# Patient Record
Sex: Female | Born: 1945 | Race: Black or African American | Hispanic: No | Marital: Single | Smoking: Former smoker
Health system: Southern US, Community
[De-identification: ages and names within clinical notes are randomized; demographics above are authoritative.]

## PROBLEM LIST (undated history)

## (undated) DIAGNOSIS — M797 Fibromyalgia: Secondary | ICD-10-CM

## (undated) DIAGNOSIS — G8929 Other chronic pain: Secondary | ICD-10-CM

## (undated) DIAGNOSIS — IMO0002 Reserved for concepts with insufficient information to code with codable children: Secondary | ICD-10-CM

## (undated) DIAGNOSIS — K219 Gastro-esophageal reflux disease without esophagitis: Secondary | ICD-10-CM

## (undated) DIAGNOSIS — M329 Systemic lupus erythematosus, unspecified: Secondary | ICD-10-CM

## (undated) DIAGNOSIS — I10 Essential (primary) hypertension: Secondary | ICD-10-CM

## (undated) DIAGNOSIS — E079 Disorder of thyroid, unspecified: Secondary | ICD-10-CM

## (undated) DIAGNOSIS — E785 Hyperlipidemia, unspecified: Secondary | ICD-10-CM

## (undated) HISTORY — PX: ABDOMINAL HYSTERECTOMY: SHX81

## (undated) HISTORY — DX: Other chronic pain: G89.29

## (undated) HISTORY — DX: Gastro-esophageal reflux disease without esophagitis: K21.9

## (undated) HISTORY — PX: SPINAL FIXATION SURGERY: SHX1055

## (undated) HISTORY — PX: CHOLECYSTECTOMY: SHX55

## (undated) HISTORY — DX: Essential (primary) hypertension: I10

## (undated) HISTORY — DX: Hyperlipidemia, unspecified: E78.5

## (undated) SURGERY — COLONOSCOPY
Anesthesia: Moderate Sedation

---

## 2000-12-12 ENCOUNTER — Ambulatory Visit (HOSPITAL_COMMUNITY): Admission: RE | Admit: 2000-12-12 | Discharge: 2000-12-12 | Payer: Self-pay | Admitting: Internal Medicine

## 2000-12-12 ENCOUNTER — Encounter: Payer: Self-pay | Admitting: Internal Medicine

## 2000-12-23 ENCOUNTER — Ambulatory Visit (HOSPITAL_COMMUNITY): Admission: RE | Admit: 2000-12-23 | Discharge: 2000-12-23 | Payer: Self-pay | Admitting: Internal Medicine

## 2001-04-06 ENCOUNTER — Encounter (HOSPITAL_COMMUNITY): Admission: RE | Admit: 2001-04-06 | Discharge: 2001-05-06 | Payer: Self-pay | Admitting: Rheumatology

## 2001-05-25 ENCOUNTER — Encounter (HOSPITAL_COMMUNITY): Admission: RE | Admit: 2001-05-25 | Discharge: 2001-06-24 | Payer: Self-pay | Admitting: Oncology

## 2001-07-04 ENCOUNTER — Ambulatory Visit (HOSPITAL_COMMUNITY): Admission: RE | Admit: 2001-07-04 | Discharge: 2001-07-04 | Payer: Self-pay | Admitting: Internal Medicine

## 2001-07-04 ENCOUNTER — Encounter: Payer: Self-pay | Admitting: Internal Medicine

## 2001-07-05 ENCOUNTER — Ambulatory Visit (HOSPITAL_COMMUNITY): Admission: RE | Admit: 2001-07-05 | Discharge: 2001-07-05 | Payer: Self-pay | Admitting: Internal Medicine

## 2001-07-05 ENCOUNTER — Encounter: Payer: Self-pay | Admitting: Internal Medicine

## 2001-07-19 ENCOUNTER — Ambulatory Visit (HOSPITAL_COMMUNITY): Admission: RE | Admit: 2001-07-19 | Discharge: 2001-07-19 | Payer: Self-pay | Admitting: Internal Medicine

## 2001-07-23 ENCOUNTER — Emergency Department (HOSPITAL_COMMUNITY): Admission: EM | Admit: 2001-07-23 | Discharge: 2001-07-23 | Payer: Self-pay | Admitting: Emergency Medicine

## 2001-07-27 ENCOUNTER — Encounter (HOSPITAL_COMMUNITY): Admission: RE | Admit: 2001-07-27 | Discharge: 2001-08-26 | Payer: Self-pay | Admitting: Rheumatology

## 2001-11-17 ENCOUNTER — Encounter: Payer: Self-pay | Admitting: Internal Medicine

## 2001-11-17 ENCOUNTER — Ambulatory Visit (HOSPITAL_COMMUNITY): Admission: RE | Admit: 2001-11-17 | Discharge: 2001-11-17 | Payer: Self-pay | Admitting: Internal Medicine

## 2001-12-05 ENCOUNTER — Ambulatory Visit (HOSPITAL_COMMUNITY): Admission: RE | Admit: 2001-12-05 | Discharge: 2001-12-05 | Payer: Self-pay | Admitting: General Surgery

## 2002-04-24 ENCOUNTER — Ambulatory Visit (HOSPITAL_COMMUNITY): Admission: RE | Admit: 2002-04-24 | Discharge: 2002-04-24 | Payer: Self-pay | Admitting: Family Medicine

## 2002-04-24 ENCOUNTER — Encounter: Payer: Self-pay | Admitting: Family Medicine

## 2002-06-07 ENCOUNTER — Ambulatory Visit (HOSPITAL_COMMUNITY): Admission: RE | Admit: 2002-06-07 | Discharge: 2002-06-07 | Payer: Self-pay | Admitting: Internal Medicine

## 2002-06-07 ENCOUNTER — Encounter (INDEPENDENT_AMBULATORY_CARE_PROVIDER_SITE_OTHER): Payer: Self-pay | Admitting: Internal Medicine

## 2002-09-18 ENCOUNTER — Encounter (HOSPITAL_COMMUNITY): Admission: RE | Admit: 2002-09-18 | Discharge: 2002-10-18 | Payer: Self-pay | Admitting: Internal Medicine

## 2002-12-03 ENCOUNTER — Emergency Department (HOSPITAL_COMMUNITY): Admission: EM | Admit: 2002-12-03 | Discharge: 2002-12-03 | Payer: Self-pay | Admitting: *Deleted

## 2002-12-03 ENCOUNTER — Encounter: Payer: Self-pay | Admitting: *Deleted

## 2002-12-05 ENCOUNTER — Emergency Department (HOSPITAL_COMMUNITY): Admission: EM | Admit: 2002-12-05 | Discharge: 2002-12-05 | Payer: Self-pay | Admitting: Emergency Medicine

## 2002-12-05 ENCOUNTER — Encounter: Payer: Self-pay | Admitting: Emergency Medicine

## 2002-12-20 ENCOUNTER — Encounter: Payer: Self-pay | Admitting: Family Medicine

## 2002-12-20 ENCOUNTER — Inpatient Hospital Stay (HOSPITAL_COMMUNITY): Admission: AD | Admit: 2002-12-20 | Discharge: 2002-12-27 | Payer: Self-pay | Admitting: Family Medicine

## 2002-12-20 ENCOUNTER — Ambulatory Visit (HOSPITAL_COMMUNITY): Admission: RE | Admit: 2002-12-20 | Discharge: 2002-12-20 | Payer: Self-pay | Admitting: Family Medicine

## 2002-12-21 ENCOUNTER — Encounter (INDEPENDENT_AMBULATORY_CARE_PROVIDER_SITE_OTHER): Payer: Self-pay | Admitting: Internal Medicine

## 2002-12-25 ENCOUNTER — Encounter: Payer: Self-pay | Admitting: Internal Medicine

## 2003-02-03 ENCOUNTER — Inpatient Hospital Stay (HOSPITAL_COMMUNITY): Admission: EM | Admit: 2003-02-03 | Discharge: 2003-02-09 | Payer: Self-pay | Admitting: Emergency Medicine

## 2003-02-04 ENCOUNTER — Encounter: Payer: Self-pay | Admitting: Family Medicine

## 2003-02-17 ENCOUNTER — Emergency Department (HOSPITAL_COMMUNITY): Admission: EM | Admit: 2003-02-17 | Discharge: 2003-02-17 | Payer: Self-pay | Admitting: Internal Medicine

## 2003-03-06 ENCOUNTER — Encounter: Payer: Self-pay | Admitting: Internal Medicine

## 2003-03-06 ENCOUNTER — Ambulatory Visit (HOSPITAL_COMMUNITY): Admission: RE | Admit: 2003-03-06 | Discharge: 2003-03-06 | Payer: Self-pay | Admitting: Internal Medicine

## 2003-03-28 ENCOUNTER — Encounter (HOSPITAL_COMMUNITY): Admission: RE | Admit: 2003-03-28 | Discharge: 2003-04-27 | Payer: Self-pay | Admitting: Rheumatology

## 2003-06-20 ENCOUNTER — Encounter (HOSPITAL_COMMUNITY): Admission: RE | Admit: 2003-06-20 | Discharge: 2003-07-20 | Payer: Self-pay | Admitting: Rheumatology

## 2003-07-04 ENCOUNTER — Ambulatory Visit (HOSPITAL_COMMUNITY): Admission: RE | Admit: 2003-07-04 | Discharge: 2003-07-04 | Payer: Self-pay | Admitting: Internal Medicine

## 2003-07-23 ENCOUNTER — Emergency Department (HOSPITAL_COMMUNITY): Admission: EM | Admit: 2003-07-23 | Discharge: 2003-07-23 | Payer: Self-pay | Admitting: Emergency Medicine

## 2003-08-29 ENCOUNTER — Ambulatory Visit (HOSPITAL_COMMUNITY): Admission: RE | Admit: 2003-08-29 | Discharge: 2003-08-29 | Payer: Self-pay | Admitting: Internal Medicine

## 2003-11-12 ENCOUNTER — Inpatient Hospital Stay (HOSPITAL_COMMUNITY): Admission: EM | Admit: 2003-11-12 | Discharge: 2003-11-16 | Payer: Self-pay | Admitting: Emergency Medicine

## 2003-12-04 ENCOUNTER — Inpatient Hospital Stay (HOSPITAL_COMMUNITY): Admission: EM | Admit: 2003-12-04 | Discharge: 2003-12-18 | Payer: Self-pay | Admitting: Emergency Medicine

## 2004-01-08 ENCOUNTER — Inpatient Hospital Stay (HOSPITAL_COMMUNITY): Admission: EM | Admit: 2004-01-08 | Discharge: 2004-01-15 | Payer: Self-pay | Admitting: Emergency Medicine

## 2004-02-04 ENCOUNTER — Ambulatory Visit (HOSPITAL_COMMUNITY): Admission: RE | Admit: 2004-02-04 | Discharge: 2004-02-04 | Payer: Self-pay | Admitting: Internal Medicine

## 2004-02-27 ENCOUNTER — Inpatient Hospital Stay (HOSPITAL_COMMUNITY): Admission: EM | Admit: 2004-02-27 | Discharge: 2004-03-14 | Payer: Self-pay | Admitting: Emergency Medicine

## 2004-03-17 ENCOUNTER — Inpatient Hospital Stay (HOSPITAL_COMMUNITY): Admission: EM | Admit: 2004-03-17 | Discharge: 2004-03-24 | Payer: Self-pay | Admitting: Emergency Medicine

## 2004-06-01 ENCOUNTER — Emergency Department (HOSPITAL_COMMUNITY): Admission: EM | Admit: 2004-06-01 | Discharge: 2004-06-01 | Payer: Self-pay | Admitting: Emergency Medicine

## 2004-06-10 ENCOUNTER — Inpatient Hospital Stay (HOSPITAL_COMMUNITY): Admission: AD | Admit: 2004-06-10 | Discharge: 2004-06-15 | Payer: Self-pay | Admitting: Internal Medicine

## 2004-08-02 ENCOUNTER — Inpatient Hospital Stay (HOSPITAL_COMMUNITY): Admission: EM | Admit: 2004-08-02 | Discharge: 2004-08-11 | Payer: Self-pay | Admitting: Emergency Medicine

## 2004-09-04 ENCOUNTER — Ambulatory Visit: Payer: Self-pay | Admitting: Psychiatry

## 2004-09-08 ENCOUNTER — Emergency Department (HOSPITAL_COMMUNITY): Admission: EM | Admit: 2004-09-08 | Discharge: 2004-09-08 | Payer: Self-pay | Admitting: Emergency Medicine

## 2004-10-16 ENCOUNTER — Inpatient Hospital Stay (HOSPITAL_COMMUNITY): Admission: AD | Admit: 2004-10-16 | Discharge: 2004-10-26 | Payer: Self-pay | Admitting: Internal Medicine

## 2004-10-16 ENCOUNTER — Ambulatory Visit: Payer: Self-pay | Admitting: Internal Medicine

## 2004-11-22 ENCOUNTER — Emergency Department (HOSPITAL_COMMUNITY): Admission: EM | Admit: 2004-11-22 | Discharge: 2004-11-22 | Payer: Self-pay | Admitting: Emergency Medicine

## 2004-12-16 ENCOUNTER — Inpatient Hospital Stay (HOSPITAL_COMMUNITY): Admission: EM | Admit: 2004-12-16 | Discharge: 2004-12-25 | Payer: Self-pay | Admitting: Emergency Medicine

## 2004-12-16 ENCOUNTER — Ambulatory Visit: Payer: Self-pay | Admitting: Gastroenterology

## 2005-03-02 ENCOUNTER — Inpatient Hospital Stay (HOSPITAL_COMMUNITY): Admission: EM | Admit: 2005-03-02 | Discharge: 2005-03-11 | Payer: Self-pay | Admitting: Emergency Medicine

## 2005-04-05 ENCOUNTER — Emergency Department (HOSPITAL_COMMUNITY): Admission: EM | Admit: 2005-04-05 | Discharge: 2005-04-05 | Payer: Self-pay | Admitting: Emergency Medicine

## 2005-05-24 ENCOUNTER — Encounter: Payer: Self-pay | Admitting: Obstetrics and Gynecology

## 2005-05-24 ENCOUNTER — Inpatient Hospital Stay (HOSPITAL_COMMUNITY): Admission: RE | Admit: 2005-05-24 | Discharge: 2005-05-26 | Payer: Self-pay | Admitting: Obstetrics and Gynecology

## 2005-07-04 ENCOUNTER — Emergency Department (HOSPITAL_COMMUNITY): Admission: EM | Admit: 2005-07-04 | Discharge: 2005-07-04 | Payer: Self-pay | Admitting: Emergency Medicine

## 2005-07-09 ENCOUNTER — Ambulatory Visit: Payer: Self-pay | Admitting: Psychiatry

## 2005-08-06 ENCOUNTER — Ambulatory Visit: Payer: Self-pay | Admitting: Psychiatry

## 2005-08-09 ENCOUNTER — Ambulatory Visit (HOSPITAL_COMMUNITY): Admission: RE | Admit: 2005-08-09 | Discharge: 2005-08-09 | Payer: Self-pay | Admitting: Family Medicine

## 2005-08-20 ENCOUNTER — Ambulatory Visit: Payer: Self-pay | Admitting: Psychiatry

## 2005-09-01 ENCOUNTER — Inpatient Hospital Stay (HOSPITAL_COMMUNITY): Admission: EM | Admit: 2005-09-01 | Discharge: 2005-09-23 | Payer: Self-pay | Admitting: Emergency Medicine

## 2005-09-07 ENCOUNTER — Ambulatory Visit: Payer: Self-pay | Admitting: Internal Medicine

## 2005-11-18 ENCOUNTER — Inpatient Hospital Stay (HOSPITAL_COMMUNITY): Admission: AD | Admit: 2005-11-18 | Discharge: 2005-12-02 | Payer: Self-pay | Admitting: Internal Medicine

## 2005-12-23 ENCOUNTER — Emergency Department (HOSPITAL_COMMUNITY): Admission: EM | Admit: 2005-12-23 | Discharge: 2005-12-23 | Payer: Self-pay | Admitting: Emergency Medicine

## 2005-12-27 ENCOUNTER — Emergency Department (HOSPITAL_COMMUNITY): Admission: EM | Admit: 2005-12-27 | Discharge: 2005-12-28 | Payer: Self-pay | Admitting: Emergency Medicine

## 2005-12-28 ENCOUNTER — Inpatient Hospital Stay (HOSPITAL_COMMUNITY): Admission: AD | Admit: 2005-12-28 | Discharge: 2006-01-10 | Payer: Self-pay | Admitting: Family Medicine

## 2005-12-29 ENCOUNTER — Ambulatory Visit: Payer: Self-pay | Admitting: Internal Medicine

## 2006-01-24 ENCOUNTER — Inpatient Hospital Stay (HOSPITAL_COMMUNITY): Admission: AD | Admit: 2006-01-24 | Discharge: 2006-02-08 | Payer: Self-pay | Admitting: Family Medicine

## 2006-01-31 ENCOUNTER — Ambulatory Visit: Payer: Self-pay | Admitting: Internal Medicine

## 2006-05-19 ENCOUNTER — Emergency Department (HOSPITAL_COMMUNITY): Admission: EM | Admit: 2006-05-19 | Discharge: 2006-05-19 | Payer: Self-pay | Admitting: Emergency Medicine

## 2006-05-20 ENCOUNTER — Inpatient Hospital Stay (HOSPITAL_COMMUNITY): Admission: EM | Admit: 2006-05-20 | Discharge: 2006-05-27 | Payer: Self-pay | Admitting: Emergency Medicine

## 2006-07-04 ENCOUNTER — Inpatient Hospital Stay (HOSPITAL_COMMUNITY): Admission: AD | Admit: 2006-07-04 | Discharge: 2006-07-08 | Payer: Self-pay | Admitting: Internal Medicine

## 2006-08-26 ENCOUNTER — Inpatient Hospital Stay (HOSPITAL_COMMUNITY): Admission: AD | Admit: 2006-08-26 | Discharge: 2006-08-29 | Payer: Self-pay | Admitting: Internal Medicine

## 2006-12-21 ENCOUNTER — Inpatient Hospital Stay (HOSPITAL_COMMUNITY): Admission: AD | Admit: 2006-12-21 | Discharge: 2006-12-27 | Payer: Self-pay | Admitting: Internal Medicine

## 2007-04-22 ENCOUNTER — Emergency Department (HOSPITAL_COMMUNITY): Admission: EM | Admit: 2007-04-22 | Discharge: 2007-04-22 | Payer: Self-pay | Admitting: Emergency Medicine

## 2008-02-15 ENCOUNTER — Ambulatory Visit (HOSPITAL_COMMUNITY): Admission: RE | Admit: 2008-02-15 | Discharge: 2008-02-15 | Payer: Self-pay | Admitting: Internal Medicine

## 2008-07-04 ENCOUNTER — Emergency Department (HOSPITAL_COMMUNITY): Admission: EM | Admit: 2008-07-04 | Discharge: 2008-07-04 | Payer: Self-pay | Admitting: Emergency Medicine

## 2008-11-15 ENCOUNTER — Ambulatory Visit (HOSPITAL_COMMUNITY): Admission: RE | Admit: 2008-11-15 | Discharge: 2008-11-15 | Payer: Self-pay | Admitting: Internal Medicine

## 2008-11-28 ENCOUNTER — Emergency Department (HOSPITAL_COMMUNITY): Admission: EM | Admit: 2008-11-28 | Discharge: 2008-11-28 | Payer: Self-pay | Admitting: Emergency Medicine

## 2009-03-07 ENCOUNTER — Emergency Department (HOSPITAL_COMMUNITY): Admission: EM | Admit: 2009-03-07 | Discharge: 2009-03-07 | Payer: Self-pay | Admitting: Emergency Medicine

## 2009-05-18 ENCOUNTER — Emergency Department (HOSPITAL_COMMUNITY): Admission: EM | Admit: 2009-05-18 | Discharge: 2009-05-18 | Payer: Self-pay | Admitting: Emergency Medicine

## 2010-09-26 ENCOUNTER — Encounter: Payer: Self-pay | Admitting: Family Medicine

## 2010-09-27 ENCOUNTER — Encounter: Payer: Self-pay | Admitting: Internal Medicine

## 2010-09-27 ENCOUNTER — Encounter (INDEPENDENT_AMBULATORY_CARE_PROVIDER_SITE_OTHER): Payer: Self-pay | Admitting: Internal Medicine

## 2010-09-30 ENCOUNTER — Encounter: Payer: Self-pay | Admitting: Orthopedic Surgery

## 2010-09-30 ENCOUNTER — Ambulatory Visit
Admission: RE | Admit: 2010-09-30 | Discharge: 2010-09-30 | Payer: Self-pay | Source: Home / Self Care | Attending: Orthopedic Surgery | Admitting: Orthopedic Surgery

## 2010-09-30 DIAGNOSIS — M179 Osteoarthritis of knee, unspecified: Secondary | ICD-10-CM | POA: Insufficient documentation

## 2010-09-30 DIAGNOSIS — M1710 Unilateral primary osteoarthritis, unspecified knee: Secondary | ICD-10-CM | POA: Insufficient documentation

## 2010-10-08 NOTE — Medication Information (Signed)
Summary: Tax adviser   Imported By: Cammie Sickle 09/30/2010 20:56:26  _____________________________________________________________________  External Attachment:    Type:   Image     Comment:   External Document

## 2010-10-08 NOTE — Letter (Signed)
Summary: *Orthopedic Consult Note  [facility]  [address]  [place], [place] [address]   Phone: [phone]  Fax: [phone]    Re:    [name] DOB:    09-02-1946   Dear: Dr. Regino Schultze    Thank you for requesting that we see the above patient for consultation.  A copy of the detailed office note will be sent under separate cover, for your review.  Evaluation today is consistent with:  1)  KNEE, ARTHRITIS, DEGEN./OSTEO (ICD-715.96)   Our recommendation is for: Injection as a short term measure. however, if she will need knee replacement. She will need tertiary care for the following reasons. #1 she has chronic pain in his only chronic pain related or not with. She also has lupus. She will need in house close medical management. She has severe osteoarthritis of that knee.       Thank you for this opportunity to look after your patient.  Sincerely,   Terrance Mass. MD.

## 2010-10-08 NOTE — Letter (Signed)
Summary: History form  History form   Imported By: Jacklynn Ganong 10/02/2010 13:15:06  _____________________________________________________________________  External Attachment:    Type:   Image     Comment:   External Document

## 2010-10-08 NOTE — Assessment & Plan Note (Signed)
Summary: RT KNEE PAIN/NEEDS XRAYS/REF W.[facility]GOUGH/MEDICARE/CA MCD/CAF   Visit Type:  Initial Consult Referring Provider:  same Primary Provider:  Dr Regino Schultze  CC:  bilateral knee pain.  History of Present Illness:   This 65 year old female with history of lupus, status post hysterectomy, and cholecystectomy, history of chronic pain, treated with hydromorphone, presents with bilateral knee pain, RIGHT greater than LEFT. Patient denies any injury. Complains of throbbing 8/10 knee pain, which has become constant. It is worse in the morning and worse at night and worse with exercise. She gets relief from applying heat to her knees, but is worse with walking. In her knees and very swollen. Her knee pain is diffuse.  Family history of heart disease, arthritis, cancer, and diabetes.  She is single unemployed does not smoke or tracking. Educational level is 312  Review of systems weight gain and fatigue blurred vision chest pain, shortness of breath, nausea, constipation, stiffness, muscle pain, dizziness, anxiety, depression, heating, cold intolerance.  Denies frequency, urgency, changes in skin, easy bleeding or bruising or adverse reaction to food.  She listed allergies to sulfa, and Cymbalta  Past History:  Past Medical History: LUPUS   Past Surgical History: Cholecystectomy Total Abdominal Hysterectomy  Family History: see hpi   Social History: see hpi   Review of Systems      See HPI  Physical Exam  Additional Exam:   Gen. appearance is medium claim.  She is oriented x3. Mood and affect are normal.  She seems to ambulate, favoring her RIGHT leg, but she has bilateral pain in her knees.  Upper extremities show no gross malalignment changes. No joint contracture, subluxation, atrophy, or tremor. Skin is normal.  Pulses and temperature are normal in her upper extremities and there is no lymphadenopathy. Sensation is normal.alignment seems normal. Range of motion limited  to 95 of flexion with a painful flexion-extension arc. Throughout. There is no heat or warmth in the knee. The joint is stable. The strength is normal in quads. Skin is intact pulse and temperature are normal. There is no lymphadenopathy. Sensation remains normal as well.  The LEFT knee is not swollen, but has crepitance on range of motion flexion is 110 extension is -5, stability and strength remained normal. Skin and pulse are good. Lymph nodes are normal. Sensation is intact.  Overall, coordination and balance are normal.   Impression & Recommendations:  Problem # 1:  KNEE, ARTHRITIS, DEGEN./OSTEO (ICD-715.96) Assessment New  Separate and Identifiable X-Ray report   radiographs 3 views of the RIGHT knee.  She has severe valgus arthritis Tritus of the RIGHT knee with joint space narrowing medially and laterally. Multiple osteophytes and what appeared to be multiple loose bodies perhaps.  The joint appears to be destroyed.  Impression severe osteoarthritis with valgus deformity of the RIGHT knee.  Verbal consent was obtained. The RIGHT knee was prepped with alcohol and ethyl chloride. 1 cc of depomedrol 40mg /cc and 4 cc of lidocaine 1% was injected. there were no complications.  Her updated medication list for this problem includes:    Nabumetone 500 Mg Tabs (Nabumetone) .Marland Kitchen... 1 by mouth two times a day  Orders: New Patient Level III (75643) Knee x-ray,  3 views (32951) Joint Aspirate / Injection, Large (20610) Depo- Medrol 40mg  (J1030)  And injected her RIGHT knee. She has chronic pain and is on a strong pain reliever, which doesn't appear to help her knee pain. She will need a knee replacement and with her history of lupus. She  will need tertiary care when that time comes.  Medications Added to Medication List This Visit: 1)  Nabumetone 500 Mg Tabs (Nabumetone) .Marland Kitchen.. 1 by mouth two times a day  Patient Instructions: 1)  Please schedule a follow-up appointment as  needed. 2)  You have received an injection of cortisone today. You may experience increased pain at the injection site. Apply ice pack to the area for 20 minutes every 2 hours and take 2 xtra strength tylenol every 8 hours. This increased pain will usually resolve in 24 hours. The injection will take effect in 3-10 days.  Prescriptions: NABUMETONE 500 MG TABS (NABUMETONE) 1 by mouth two times a day  #90 x 5   Entered and Authorized by:   Fuller Canada MD   Signed by:   Fuller Canada MD on 09/30/2010   Method used:   Handwritten   RxID:   [id number]    Orders Added: 1)  New Patient Level III [56213] 2)  Knee x-ray,  3 views [73562] 3)  Joint Aspirate / Injection, Large [20610] 4)  Depo- Medrol 40mg  [J1030]

## 2010-11-14 ENCOUNTER — Emergency Department (HOSPITAL_COMMUNITY)
Admission: EM | Admit: 2010-11-14 | Discharge: 2010-11-14 | Disposition: A | Discharge status: Home / Self Care | Payer: Medicare Other | Attending: Emergency Medicine | Admitting: Emergency Medicine

## 2010-11-14 DIAGNOSIS — E789 Disorder of lipoprotein metabolism, unspecified: Secondary | ICD-10-CM | POA: Insufficient documentation

## 2010-11-14 DIAGNOSIS — E119 Type 2 diabetes mellitus without complications: Secondary | ICD-10-CM | POA: Insufficient documentation

## 2010-11-14 DIAGNOSIS — I10 Essential (primary) hypertension: Secondary | ICD-10-CM | POA: Insufficient documentation

## 2010-11-14 DIAGNOSIS — R0609 Other forms of dyspnea: Secondary | ICD-10-CM | POA: Insufficient documentation

## 2010-11-14 DIAGNOSIS — IMO0001 Reserved for inherently not codable concepts without codable children: Secondary | ICD-10-CM | POA: Insufficient documentation

## 2010-11-14 DIAGNOSIS — F3289 Other specified depressive episodes: Secondary | ICD-10-CM | POA: Insufficient documentation

## 2010-11-14 DIAGNOSIS — Z9889 Other specified postprocedural states: Secondary | ICD-10-CM | POA: Insufficient documentation

## 2010-11-14 DIAGNOSIS — Z79899 Other long term (current) drug therapy: Secondary | ICD-10-CM | POA: Insufficient documentation

## 2010-11-14 DIAGNOSIS — M329 Systemic lupus erythematosus, unspecified: Secondary | ICD-10-CM | POA: Insufficient documentation

## 2010-11-14 DIAGNOSIS — R0989 Other specified symptoms and signs involving the circulatory and respiratory systems: Secondary | ICD-10-CM | POA: Insufficient documentation

## 2010-11-14 DIAGNOSIS — E039 Hypothyroidism, unspecified: Secondary | ICD-10-CM | POA: Insufficient documentation

## 2010-11-14 DIAGNOSIS — F329 Major depressive disorder, single episode, unspecified: Secondary | ICD-10-CM | POA: Insufficient documentation

## 2010-11-14 LAB — DIFFERENTIAL
Basophils Absolute: 0 10*3/uL (ref 0.0–0.1)
Eosinophils Absolute: 0.2 10*3/uL (ref 0.0–0.7)
Eosinophils Relative: 6 % — ABNORMAL HIGH (ref 0–5)
Lymphocytes Relative: 34 % (ref 12–46)
Monocytes Absolute: 0.3 10*3/uL (ref 0.1–1.0)

## 2010-11-14 LAB — CBC
HCT: 33.5 % — ABNORMAL LOW (ref 36.0–46.0)
MCHC: 32.2 g/dL (ref 30.0–36.0)
RDW: 12.5 % (ref 11.5–15.5)

## 2010-11-14 LAB — BASIC METABOLIC PANEL
CO2: 28 mEq/L (ref 19–32)
Calcium: 9.5 mg/dL (ref 8.4–10.5)
Creatinine, Ser: 1.08 mg/dL (ref 0.4–1.2)
GFR calc Af Amer: >60 mL/min (ref >60)
Glucose, Bld: 99 mg/dL (ref 70–99)

## 2010-11-14 LAB — URINALYSIS, ROUTINE W REFLEX MICROSCOPIC
Bilirubin Urine: NEGATIVE
Hgb urine dipstick: NEGATIVE
Protein, ur: NEGATIVE mg/dL
Urobilinogen, UA: 0.2 mg/dL (ref 0.0–1.0)

## 2010-12-11 LAB — DIFFERENTIAL
Basophils Absolute: 0 10*3/uL (ref 0.0–0.1)
Basophils Relative: 1 % (ref 0–1)
Neutro Abs: 1.3 10*3/uL — ABNORMAL LOW (ref 1.7–7.7)
Neutrophils Relative %: 50 % (ref 43–77)

## 2010-12-11 LAB — COMPREHENSIVE METABOLIC PANEL
ALT: 24 U/L (ref 0–35)
AST: 36 U/L (ref 0–37)
Calcium: 9.5 mg/dL (ref 8.4–10.5)
GFR calc Af Amer: >60 mL/min (ref >60)
Sodium: 139 mEq/L (ref 135–145)
Total Protein: 7.3 g/dL (ref 6.0–8.3)

## 2010-12-11 LAB — URINALYSIS, ROUTINE W REFLEX MICROSCOPIC
Glucose, UA: NEGATIVE mg/dL
Ketones, ur: NEGATIVE mg/dL
Specific Gravity, Urine: 1.01 (ref 1.005–1.030)
pH: 7.5 (ref 5.0–8.0)

## 2010-12-11 LAB — CBC
MCHC: 35.2 g/dL (ref 30.0–36.0)
Platelets: 157 10*3/uL (ref 150–400)
RDW: 12.4 % (ref 11.5–15.5)

## 2010-12-13 LAB — COMPREHENSIVE METABOLIC PANEL
Alkaline Phosphatase: 62 U/L (ref 39–117)
BUN: 14 mg/dL (ref 6–23)
CO2: 32 mEq/L (ref 19–32)
Chloride: 103 mEq/L (ref 96–112)
GFR calc non Af Amer: 49 mL/min — ABNORMAL LOW (ref >60)
Glucose, Bld: 83 mg/dL (ref 70–99)
Potassium: 4 mEq/L (ref 3.5–5.1)
Total Bilirubin: 0.4 mg/dL (ref 0.3–1.2)
Total Protein: 6.8 g/dL (ref 6.0–8.3)

## 2010-12-13 LAB — CBC
HCT: 30.4 % — ABNORMAL LOW (ref 36.0–46.0)
Hemoglobin: 10.8 g/dL — ABNORMAL LOW (ref 12.0–15.0)
RBC: 3.26 MIL/uL — ABNORMAL LOW (ref 3.87–5.11)
RDW: 13 % (ref 11.5–15.5)

## 2010-12-13 LAB — DIFFERENTIAL
Basophils Absolute: 0 10*3/uL (ref 0.0–0.1)
Basophils Relative: 0 % (ref 0–1)
Eosinophils Absolute: 0.1 10*3/uL (ref 0.0–0.7)
Neutro Abs: 2.3 10*3/uL (ref 1.7–7.7)
Neutrophils Relative %: 58 % (ref 43–77)

## 2010-12-13 LAB — POCT CARDIAC MARKERS: Myoglobin, poc: 67.5 ng/mL (ref 12–200)

## 2010-12-13 LAB — D-DIMER, QUANTITATIVE: D-Dimer, Quant: 0.54 ug/mL-FEU — ABNORMAL HIGH (ref 0.00–0.48)

## 2010-12-16 DIAGNOSIS — I10 Essential (primary) hypertension: Secondary | ICD-10-CM

## 2010-12-16 DIAGNOSIS — E785 Hyperlipidemia, unspecified: Secondary | ICD-10-CM

## 2010-12-16 DIAGNOSIS — G8929 Other chronic pain: Secondary | ICD-10-CM

## 2010-12-16 HISTORY — DX: Hyperlipidemia, unspecified: E78.5

## 2010-12-16 HISTORY — DX: Essential (primary) hypertension: I10

## 2010-12-16 HISTORY — DX: Other chronic pain: G89.29

## 2010-12-17 LAB — COMPREHENSIVE METABOLIC PANEL
Albumin: 3.9 g/dL (ref 3.5–5.2)
Alkaline Phosphatase: 69 U/L (ref 39–117)
BUN: 14 mg/dL (ref 6–23)
Chloride: 103 mEq/L (ref 96–112)
Creatinine, Ser: 1.02 mg/dL (ref 0.4–1.2)
Glucose, Bld: 93 mg/dL (ref 70–99)
Potassium: 4.2 mEq/L (ref 3.5–5.1)
Total Bilirubin: 0.4 mg/dL (ref 0.3–1.2)

## 2010-12-17 LAB — URINALYSIS, ROUTINE W REFLEX MICROSCOPIC
Glucose, UA: NEGATIVE mg/dL
Hgb urine dipstick: NEGATIVE
Ketones, ur: NEGATIVE mg/dL
Protein, ur: NEGATIVE mg/dL
pH: 6 (ref 5.0–8.0)

## 2010-12-17 LAB — DIFFERENTIAL
Basophils Absolute: 0 10*3/uL (ref 0.0–0.1)
Basophils Relative: 0 % (ref 0–1)
Lymphocytes Relative: 36 % (ref 12–46)
Monocytes Absolute: 0.3 10*3/uL (ref 0.1–1.0)
Neutro Abs: 1.5 10*3/uL — ABNORMAL LOW (ref 1.7–7.7)
Neutrophils Relative %: 50 % (ref 43–77)

## 2010-12-17 LAB — CBC
HCT: 31.4 % — ABNORMAL LOW (ref 36.0–46.0)
Hemoglobin: 10.7 g/dL — ABNORMAL LOW (ref 12.0–15.0)
MCV: 92.8 fL (ref 78.0–100.0)
Platelets: 167 10*3/uL (ref 150–400)
RDW: 13 % (ref 11.5–15.5)

## 2010-12-17 LAB — LIPASE, BLOOD: Lipase: 30 U/L (ref 11–59)

## 2010-12-17 LAB — D-DIMER, QUANTITATIVE: D-Dimer, Quant: 0.63 ug/mL-FEU — ABNORMAL HIGH (ref 0.00–0.48)

## 2011-01-21 ENCOUNTER — Ambulatory Visit (INDEPENDENT_AMBULATORY_CARE_PROVIDER_SITE_OTHER): Payer: Medicare Other | Admitting: Internal Medicine

## 2011-01-22 NOTE — Discharge Summary (Signed)
[name], [name]                  ACCOUNT NO.:  [id number]   MEDICAL RECORD NO.:  [id number]          PATIENT TYPE:  INP   LOCATION:  A303                          FACILITY:  [facility]   PHYSICIAN:  Corrie Mckusick, M.D.  DATE OF BIRTH:  07-04-1946   DATE OF ADMISSION:  01/24/2006  DATE OF DISCHARGE:  06/05/2007LH                                 DISCHARGE SUMMARY   DISCHARGE DIAGNOSES:  1.  Recurrent abdominal pain and fatigue.  2.  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   The patient was admitted on the 21st with recurrent abdominal pain for again  close monitoring and pain control.  GI was once again consulted and their  input was greatly appreciated.  Please see their notes for details.  She had  been recently admitted to the hospital.  Please see those notes for further  details.   Due to the weakness and an episode of chest pain, we elected to get  cardiology involved and Dr. Domingo Sep felt this was atypical chest pain.  She  had seen cardiology in the past.  She felt like this was more GI in origin.  She was transfused during the stay due to anemia of chronic disease.  Dr.  Lovell Sheehan was also consulted for Port-A-Cath placement as she has got  difficult IV access.  It was elected to hold off on this as well.   She had a normal V-Q scan during the stay as well.  She remained relatively  stable during the hospital stay after that point.  It was strongly discussed  with the patient that she would benefit from nursing home placement as she  seems to return quite frequently to the hospital.  It was felt like this  would help benefit her greatly.  It was decided for placement and she was  ready for placement on June 4.  We did not obtain a bed until the 5th and  was set up for transfer to skilled nursing facility on June 5.   DIET:  Regular diet.   DISCHARGE PHYSICAL:  Temperature 98.7, blood pressure 130/60, heart rate in  the  60s, respiratory rate 16-18.  No acute distress.  CHEST:  Clear to auscultation bilaterally.  CARDIOVASCULAR:  Regular rate and rhythm.  No murmurs.  ABDOMEN:  Soft, nontender.  EXTREMITIES:  No edema.   LABS AT TIME OF DISCHARGE:  White count 5.7, hemoglobin 9.7, hematocrit  29.8, platelets 240.   DISCHARGE MEDICATIONS:  1.  Atenolol 25 mg tabs half p.o. b.i.d.  2.  Lyrica 75 mg p.o. t.i.d.  3.  Prednisone 10 mg p.o. daily.  4.  Zocor 20 mg p.o. at bedtime.  5.  Pancrease three caps p.o. t.i.d.  6.  Lasix 20 mg p.o. q.a.m.  7.  Carafate 1 gram p.o. q.i.d.  8.  Isordil 5 mg p.o. t.i.d.  9.  Synthroid 100 mcg p.o. daily.  10. Zofran 4 mg p.o. t.i.d. p.r.n.  11. Zoloft 50 mg p.o. daily.  12. MS Contin  60 mg p.o. b.i.d.  13. Protonix 40 mg p.o. daily.  14. Bentyl 10 mg p.o. t.i.d. prior to meals.   DISCHARGE CONDITION:  Improved and stable.   DISCHARGE FOLLOWUP:  At the nursing care facility with the nursing home  physician.      Corrie Mckusick, M.D.  Electronically Signed     JCG/MEDQ  D:  02/08/2006  T:  02/08/2006  Job:  [id number]

## 2011-01-22 NOTE — Discharge Summary (Signed)
NAME:  [name], Sandra                  ACCOUNT NO.:  [id number]   MEDICAL RECORD NO.:  [id number]          PATIENT TYPE:  INP   LOCATION:  A201                          FACILITY:  [facility]   PHYSICIAN:  Madelin Rear. Sherwood Gambler, MD  DATE OF BIRTH:  03/23/1946   DATE OF ADMISSION:  12/21/2006  DATE OF DISCHARGE:  04/22/2008[facility]                               DISCHARGE SUMMARY   DISCHARGE DIAGNOSES:  1. Pneumonia.  2. Lupus.  3. Pemphigus vulgaris.  4. Hypothyroidism.  5. Hypertension.   HISTORY OF PRESENT ILLNESS:  Patient was admitted for cough, fever and  sputum production.  She had classic physical findings of pneumonia.  She  also had a chest x-ray that confirmed this diagnosis.  She had a  relatively uneventful hospital course except requiring transfusion for  hemoglobin drop to less than 9 g percent.  Analgesia was adequately  obtained in house.  Antibiotics were given with resolution in her  symptoms and defervescence.   DISCHARGE MEDICATIONS:  Continue her usual home regimen consisting of  1. Lyrica 75 mg p.o. t.i.d.  2. Synthroid 125 mcg per day.  3. Atenolol 12.5 daily.  4. Reglan 5 mg p.o. daily.  5. MS Contin 60 mg p.o. b.i.d.      Madelin Rear. Sherwood Gambler, MD  Electronically Signed     LJF/MEDQ  D:  01/06/2007  T:  01/06/2007  Job:  [id number]

## 2011-01-22 NOTE — Op Note (Signed)
NAME:  [name], [name]                  ACCOUNT NO.:  [id number]   MEDICAL RECORD NO.:  [id number]          PATIENT TYPE:  INP   LOCATION:  A219                          FACILITY:  [facility]   PHYSICIAN:  R. Roetta Sessions, M.D. DATE OF BIRTH:  [birth year]   DATE OF PROCEDURE:  12/30/2005  DATE OF DISCHARGE:  12/28/2005                                 OPERATIVE REPORT   PREPROCEDURE DIAGNOSIS:  Diagnostic esophagogastroduodenoscopy.   ENDOSCOPIST:  Jonathon Bellows, M.D.   INDICATIONS FOR THE PROCEDURE:  This is 65 year old lady with numerous  medical problems including SLE now with recurrent abdominal pain and  postprandial nausea and vomiting.  EGD is now being done.  Prior EGD  demonstrated a stomach full of food; and, the exam was incomplete.  There  was no evidence of pancreatitis on labs or CT.  No evidence of high grade  large vessel ischemic disease either.  EGD is now being done.  The patient  was explained the potential risks, benefits and alternative exam.  Questions  were answered.  Please see documentation in the medial room.   MONITORS:  Oxygen saturation, blood pressure, pulse and respirations were  monitored throughout the entire procedure.   ANESTHESIA:  Conscious sedation with Versed 3 mg IV and Demerol 75 mg IV in  divided doses.   INSTRUMENT USED:  Olympus video endoscope.   DESCRIPTION OF THE PROCEDURE AND FINDINGS:  Examination of the tubular  esophagus revealed no mucosal abnormalities.  The esophagogastric junction  was easily traversed.  The stomach, gastric cavity, was empty and __________  .  Thorough examination of the gastric mucosa including retroflexion in the  proximal stomach, esophagogastric junction.  This demonstrated a mottled-  appearing mucosa with some nonspecific inflammatory changes.  There was no  infiltrating process or ulcer.  The pylorus was patent and easily traversed,  and the duodenal bulb and second portion revealed no abnormalities.   MANEUVERS:  Therapeutic/diagnostic maneuvers performed; none.   DISPOSITION:  The patient tolerated the procedure well and was reactive at  the end of the endoscopy.   IMPRESSION:  1.  Normal esophagus.  2.  There was some mottling of the gastric mucosa.  3.  No infiltrating processes or ulcers were seen.  4.  Suspect chronic gastritis.  5.  Patent pylorus.  6.  Normal first portion of the duodenum and second portion of the duodenum.   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  The treatment will  be a challenge.   RECOMMENDATIONS:  1.  We will try low-dose Isordil IIE 2.5 mg 30 minutes before meals three      times a day to see if we can increase blood flow to the gut and help      with her symptoms.  2.  Low-fat diet.  3.  Check a sed rate.  4.  Consider rheumatology consult to assist in pain management, etc if not      done recently.  Jonathon Bellows, M.D.  Electronically Signed     RMR/MEDQ  D:  12/30/2005  T:  12/31/2005  Job:  [id number]   cc:   Patrica Duel, M.D.  Fax: [phone]

## 2011-01-22 NOTE — Discharge Summary (Signed)
[name], Sandra                  ACCOUNT NO.:  [id number]   MEDICAL RECORD NO.:  [id number]          PATIENT TYPE:  INP   LOCATION:  A417                          FACILITY:  [facility]   PHYSICIAN:  Tilda Burrow, M.D. DATE OF BIRTH:  31-Jan-1946   DATE OF ADMISSION:  05/24/2005  DATE OF DISCHARGE:  09/20/2006LH                                 DISCHARGE SUMMARY   ADMISSION DIAGNOSES:  1.  Cystocele and rectocele.  2.  Lupus erythematosus.  3.  Chronic right lower quadrant ache.  4.  Chronic headaches.   DISCHARGE DIAGNOSES:  1.  Cystocele and rectocele.  2.  Lupus erythematosus.  3.  Chronic right lower quadrant ache.  4.  Chronic headaches.   PROCEDURE:  Anterior and posterior repair.   DISCHARGE MEDICATIONS:  1.  Magic mouthwash one tsp. q.i.d.  2.  Carafate 2 tsp. a.c. and h.s.  3.  Fluconazole 100 mg p.o. daily.  4.  Nystatin swish one tsp. q.i.d.  5.  Synthroid 0.125 mg p.o. daily.  6.  Zoloft 100 mg daily.  7.  Phenergan 25 mg p.o. q.4 h. p.r.n.  8.  Valtrex 500 mg daily.  9.  Norvasc 10 mg p.o. daily.   HOSPITAL SUMMARY:  This rather frail 65 year old female with lupus  erythematosus was admitted for a cystocele and rectocele as a part of her  pelvic relaxation.  She has had a prior hysterectomy.  She underwent a  hysterectomy as described in the operative note of May 24, 2005.  There was a fairly straight forward surgical procedure with a small amount  of tissue removed anteriorly and posteriorly and bladder and rectal support  improved.  Postoperatively the patient had a stable hemoglobin of 9.7 and  hematocrit 27.8.  There were preoperative values, which were already anemic  with 10.3 and 31.2.  Electrolytes were normal.  Potassium 4.0, BUN 12, and  creatinine 1.0.  She was afebrile but had a very significant amount of pain  and discomfort, even though there was no  visible evidence of a complication.  She was kept an additional day for pain  management  used on the PCA pump at 1-1/2 times the normal dosing in order to  be comfortable.  The patient was discharged to resume Tylox for pain along  with her previously mentioned medications on postoperative day #2.      Tilda Burrow, M.D.  Electronically Signed     JVF/MEDQ  D:  06/07/2005  T:  06/07/2005  Job:  [id number]   cc:   [facility]

## 2011-01-22 NOTE — Op Note (Signed)
NAME:  [name], [name]                  ACCOUNT NO.:  [id number]   MEDICAL RECORD NO.:  [id number]          PATIENT TYPE:  INP   LOCATION:  A417                          FACILITY:  [facility]   PHYSICIAN:  Tilda Burrow, M.D. DATE OF BIRTH:  [birth year]   DATE OF PROCEDURE:  05/24/2005  DATE OF DISCHARGE:                                 OPERATIVE REPORT   PREOPERATIVE DIAGNOSIS:  Cystocele, rectocele (pelvic relaxation).   POSTOPERATIVE DIAGNOSIS:  Cystocele, rectocele (pelvic relaxation).   PROCEDURE:  Anterior and posterior repair.   SURGEON:  Dr. Emelda Fear.   ASSISTANTAmie Critchley, C.S.T., Phebe Colla, C.S.T.   ANESTHESIA:  General.   COMPLICATIONS:  None.   FINDINGS:  Generalized pelvic laxity with large cystocele to the introitus.   DETAILS OF PROCEDURE:  The patient was taken to the operating room and  prepped and draped for a vaginal procedure with legs in high lithotomy  support. The anterior vaginal mucosa was somewhat atrophic with bulge of the  cystourethrocele to the introitus. Posterior tissues were generally lax. The  patient had initial injection of the anterior repair area. We injected with  Marcaine with epinephrine throughout the area of dissection, then marked a 2-  cm wide x 4-cm wide ellipse of skin from the anterior mucosa and trimmed  this off from the underlying cystocele. Surgical margins were then elevated  off of the bladder to each side. We then placed a series of 6 horizontal  mattress sutures beneath the vaginal epithelium to full support beneath the  bladder. The vaginal mucosa was then reapproximated side to side using  continuous running 2-0 chromic. The posterior vaginal apex was able to be  closed by pulling the apex anterior to complete cuff closure. The patient  tolerated the procedure with 25 cc of blood loss.   Sponge and needle counts were correct, and then we proceeded with posterior  repair. A double-gloved left index finger was placed per rectum,  and used to  delineate the extent of the anterior perineal body weakness. Allis clamps  were placed at the introitus, one placed half way up the vagina for  orientation in the midline at 6 o'clock, and then we again infiltrated the  skin with Marcaine with epinephrine, split the skin in the midline up to the  mid way point in the vaginal apex and then undermined the skin on either  side until good perirectal support tissues could be identified. At this  time, a sterile glove right index finger was placed per rectum, and Allis  clamps used to grasp supportive tissue on either side. It appeared that the  perineal defect was to the left of the midline. The connective tissue could  be pulled together with a series of interrupted 2-0 Dexon sutures which  resulted in rebuilding and reducing the mobility and laxity of the perineal  body. This was pulled together with a series of sutures. Once two stitches  were placed with the index fingers removed from the rectum, sterile field  maintained, gloves removed, going to single __glove______________ technique  as had been accomplished (  similar technique had been used for the left index  finger earlier).   We then were able to pull the perineal body together using horizontal  mattress sutures which rebuilt the perineal body with good success, leaving  good vaginal length and vaginal apex flexibility. The patient tolerated the  procedure well and went to recovery room in good condition. Vaginal packing  was inserted. Foley catheter inserted revealing clear urine. Sponge and  needle counts correct.      Tilda Burrow, M.D.  Electronically Signed     JVF/MEDQ  D:  05/24/2005  T:  05/24/2005  Job:  [id number]

## 2011-01-22 NOTE — Consult Note (Signed)
NAME:  [name], [name]                  ACCOUNT NO.:  [id number]   MEDICAL RECORD NO.:  [id number]          PATIENT TYPE:  INP   LOCATION:  A219                          FACILITY:  [facility]   PHYSICIAN:  R. Roetta Sessions, M.D. DATE OF BIRTH:  1946-04-21   DATE OF CONSULTATION:  DATE OF DISCHARGE:                                   CONSULTATION   GASTROENTEROLOGY CONSULTATION   PRIMARY CARE PHYSICIAN:  Patrica Duel, M.D.   CONSULTING PHYSICIAN:  R. Roetta Sessions, M.D.   REASON FOR CONSULTATION:  Chronic recurrent abdominal pain.   HISTORY OF PRESENT ILLNESS:  [name] is a 65 year old African-American  female who presents with severe generalized abdominal pain. She states she  has had previous admissions for this, at least twice this year, one in  January as well as again in March.  She complains of weakness and fatigue.  She has abdominal pain that begins around the umbilicus and radiates through  her entire abdomen.  The pain is usually 10 out of 10.  She describes the  pain as a spasm.  The pain generally is intermittent throughout the entire  day.  She has nausea but denies any vomiting.  Generally her symptoms are  made worse postprandially.  For the last several months her symptoms have  been worse first thing in the morning.  She denies any changes in bowel  habits.  She has had a bowel movement approximately every two to three days  and denies any rectal bleeding or melena.  CT scans of abdomen and pelvis  showed bilateral atelectasis, mild intra and extrahepatic ductal dilatation  which is unchanged since January of 2007 and moderate stool in the sigmoid.   She has history of chronic gastroesophageal reflux disease. Her heartburn is  well controlled on Reglan and Protonix b.i.d.  She denies any dysphagia or  odynophagia.  She has history of polyserositis and has been treated with  prednisone in the past.  She denies any problems with rash. She has noticed  some pruritus. She  denies any dysuria, hematuria, increased urinary  frequency.   PAST MEDICAL HISTORY:  Past medical history includes an  esophagogastroduodenoscopy by Dr. Roetta Sessions on September 07, 2005, was  found to have incomplete examination due to her stomach being full of  gastric contents, suspicious for gastroparesis.  At that time she was  started on Reglan 5 mg q.a.c. and q.h.s.  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  She is status post ERCP with pancreatic stent and biliary  sphincterotomy in May of 2005, another ERCP June 18, 2004, colonoscopy by  Dr. Jena Gauss March 03, 2005 which showed anal canal hemorrhoids, otherwise  normal examination.  She is status post cholecystectomy, hysterectomy,  lumbar spinal surgery several years ago, bilateral salpingo-oophorectomy and  recent cystocele, rectocele repairs May 25, 2005 by Dr. Emelda Fear.   MEDICATIONS PRIOR TO ADMISSION:  1.  Reglan 5 mg q.a.c. and q.h.s.  2.  Valtrex 500 mg daily.  3.  Atenolol 12.5 mg b.i.d.  4.  Simvastatin 20 mg daily.  5.  Carafate 1 gram daily.  6.  Protonix 40 mg b.i.d.  7.  Lyrica 25 mg t.i.d.  8.  Lasix 20 mg t.i.d.  9.  Morphine sulfate 60 mg b.i.d.  10. Synthroid 0.125 mg daily.  11. Phenergan p.r.n.  12. Allegra p.r.n.  13. [employer]'s Magic Mouth Wash p.r.n.   ALLERGIES:  SULFA, MYCELEX, DIAZIDE's and certain DIURETICS.  She is  allergic to EGGS.   FAMILY HISTORY:  Noncontributory.   SOCIAL HISTORY:  [name] has four children in good health.  She lives  with one of her sons.  She is single.  She denies any tobacco, alcohol or  drug use.   REVIEW OF SYSTEMS:  CONSTITUTIONAL:  Weight is up 8 pounds since last  admission.  Denies any fever or chills.  CARDIOVASCULAR:  Denies any chest  pain or palpitations.  PULMONARY:  Denies any shortness of  breath, dyspnea,  cough, hemoptysis. GASTROINTESTINAL:  See history of present illness.   PHYSICAL EXAMINATION:  VITAL SIGNS:  Weight 115.6 pounds, height 69 inches,  temperature 98.1, pulse 83, respirations 19, blood pressure 133/81.  GENERAL APPEARANCE:  [name] is a chronically ill-appearing thin African-  American female who is alert, oriented, pleasant, cooperative and in no  acute distress.  HEENT:  Sclerae clear, nonicteric.  Conjunctiva are pink. Oropharynx pink  and moist without any lesions.  NECK:  Supple without any masses or thyromegaly.  CHEST:  Regular rate and rhythm with normal S1 and S2.  No murmurs, rubs or  gallops.  LUNGS:  Clear to auscultation bilaterally.  ABDOMEN:  Positive bowel sounds x4 with no bruits auscultated.  Soft,  nondistended. She does have mild tenderness around the umbilicus, left upper  quadrant and right upper quadrant on deep palpation.  There is no rebound  tenderness or guarding.  There is no hepatosplenomegaly or mass.  EXTREMITIES:  Without clubbing or edema bilaterally.  SKIN:  Brown, warm and dry.  She does have lesion to right forehead; it is  erythematous.   LABORATORY DATA:  White blood cell count 3.4, hemoglobin 9.9, hematocrit  29.6, platelet count 295,000, Pro Time 25, INR 2.3, sodium 141, potassium  3.7, chloride 109, cO2 26, BUN 17, creatinine 1, glucose 152, calcium 9.1,  total bilirubin 0.7, alkaline phosphatase 111, AST 27, ALT 23, total protein  6.4, albumin 2.8, amylase 126, lipase 29.   IMPRESSION:  [name] is a 65 year old African-American female with  history of chronic intermittent abdominal pain.  She has a history of  polyserositis as well as sphincter of Oddi dysfunction.  CT scan revealed  chronic, stable, mild intra and extrahepatic ductal dilatation, unchanged  since CT scan of January of 2007, as well as moderate amount of stool in the sigmoid colon.  She also has a history of chronic gastroesophageal  reflux  disease which is well controlled and gastroparesis.  Etiology of her chronic  pain, nausea and vomiting is intriguing as possibly related to  polyserositis, although she is not febrile.  Sphincter of Oddi dysfunction  is another possibility; but, again, she does not have transaminitis  currently so this is doubtful.  I am going to need to review a CT scan and  discuss findings further with Dr. Jena Gauss.   PLAN:  1.  Review CT scan.  2.  Agree with pain medications, steroids, antiemetics to keep patient  comfortable.  3.  Further recommendations to follow.   We would like to thank Dr. Patrica Duel for allowing [place] to participate in  the care of [name].      Nicholas Lose, N.P.      Jonathon Bellows, M.D.  Electronically Signed    KC/MEDQ  D:  12/29/2005  T:  12/30/2005  Job:  [id number]

## 2011-01-22 NOTE — Consult Note (Signed)
NAME:  [name], [name]                            ACCOUNT NO.:  [id number]   MEDICAL RECORD NO.:  [id number]                   PATIENT TYPE:  INP   LOCATION:  A320                                 FACILITY:  [facility]   PHYSICIAN:  Kofi A. Gerilyn Pilgrim, M.D.              DATE OF BIRTH:  Jan 25, 1946   DATE OF CONSULTATION:  DATE OF DISCHARGE:                                   CONSULTATION   REASON FOR CONSULTATION:  Weakness.   IMPRESSION:  Subacute global weakness with the clinical examination most  consistent with a lower motor neuron process.  Potential location includes  polyneuropathy, myopathy, and neuromuscular junction diseases.  I suspect  that the etiology is as a result of a neurologic complication of the patient  along the line of lupus.  The patient does not have any upper motor neuron  signs to suggest process involving the spinal cord or brain.  She is  certainly not parkinsonian.   RECOMMENDATIONS:  1. I want to embark upon an extensive evaluation for neuromuscular     disorders, including lab tests.  The following lab tests have been     ordered:  This is a colon receptor antibody ESR, CPK, Vitamin B12 level,     homocystine level, RPR, and TSH.  She will need an electromyography and     nerve conduction studies of the upper and lower extremities to further     elucidate her problems.   HISTORY OF PRESENT ILLNESS:  This is a 65 year old African-American female  who presented to the hospital with abdominal pain with vomiting.  She  reports having difficulty ambulating for the last several months.  She  particularly complains of pain involving the sole of the feet.  She reports  the sensation that she is walking on gravel.  She reports it is quite  painful to the point where she has to sit down.  She also reports a burning  pain involving the legs.  She does not endorse tingling or numbness,  however.  She reports that the symptoms seem to be progressing distally and  proximally  and now seem to involve the upper extremities, although she  seemed to complain of more weakness involving the upper extremities.  She  does have some weakness involving the legs to the point where it affects her  ability to ambulate.  The patient endorses dysarthria and dysphagia and in  fact has had a barium swallow evaluation through this hospitalization for  problems with swallowing.  This apparently was unremarkable.  She does not  endorse diplopia.  The patient was noted to have leathery consistency of her  lip which she says has been evaluated.  Initially it was thought to be  thrush from chlamydia but apparently it has not responded to appropriate  medications.  She apparently is due to have a biopsy done by a dentist.   PAST MEDICAL  HISTORY:  1. Significant for hypothyroidism.  2. Systemic lupus erythematosus.  3. Gastroesophageal reflux disease.  4. Pemphigus.  5. She has a history of abdominal pain.  6. She also has cholelithiasis, status post cholecystectomy.   MEDICATIONS ON ADMISSION:  1. Prednisone 5 mg daily.  2. Protonix 40 mg b.i.d.  3. Synthroid 0.1mg  daily.  4. Phenergan.   CURRENT MEDICATIONS:  1. Synthroid.  2. Protonix.  3. Carafate.  4. __________  .  5. Lisinopril.  6. Zofran.  7. Acyclovir.  8. Prednisone.  9. Duragesic patch.  10.      Norvasc.  11.      Dilaudid.  12.      Diflucan.  13.      Ambien p.r.n.   REVIEW OF SYSTEMS:  As stated in history of present illness.  No chest pain  or shortness of breath is reported.  The patient does not report any diurnal  variations or symptoms.   PHYSICAL EXAMINATION:  GENERAL:  Pleasant lady in no acute distress.  VITAL SIGNS:  Temperature 97, pulse 90, respirations 20, blood pressure  107/70./  NECK:  Supple.  HEENT:  She has a thick brown discoloration with a leathery consistency  involving the tongue.  LUNGS:  Clear to auscultation bilaterally.  CARDIOVASCULAR:  Normal S1/S2.  SKIN:  Shows  slight rash involving the face, almost in a butterfly type  configuration.  EXTREMITIES:  Do not show any significant edema.  No significant  varicosities noted.  NEUROLOGIC:  She is awake and alert.  Speech is normal.  Cognition is  unremarkable.  No aphasia is noted.  Cranial nerves show that pupils are 4  mms and reactive.  Extraocular movements are full.  Facial muscles are  symmetric bilaterally.  There does not appear to any weakness of the  orbicularis muscles.  Tongue is midline.  Uvula is midline.  Visual fields  are intact.  Motor examination actually shows good strength on direct  testing, both proximal and distal muscles involving both the upper and lower  extremities.  No Tinel's sign is noted at the wrist.  She has some good tone  and bulk involving the muscle strength.  Reflexes are +2 in the upper  extremities, somewhat diminished at the knees, in fact, they had to be  elicited with augmentation.  They seem to be normal at the ankles.  Toes are  __________  on the right and downgoing on the left.  Sensory examination  shows empiric sensation in a gradient fashion to pinprick and light touch  from the feet to above the ankles bilaterally.  Coordination is tested by  heel-to-shin, finger-to-nose, and rapid alternating hand movements were  normal.  Gait is a little bit unsteady, slightly wide-based, otherwise no  characteristic features associated with this.   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  These are thought to be old, no acute  processes noted.   Thanks for this consultation.      ___________________________________________                                            Perlie Gold Gerilyn Pilgrim, M.D.   KAD/MEDQ  D:  03/10/2004  T:  03/10/2004  Job:  [id number]

## 2011-01-22 NOTE — Consult Note (Signed)
NAME:  [name], [name]                  ACCOUNT NO.:  [id number]   MEDICAL RECORD NO.:  [id number]          PATIENT TYPE:  INP   LOCATION:  A336                          FACILITY:  [facility]   PHYSICIAN:  [name], M.D. DATE OF BIRTH:  [birth year]   DATE OF CONSULTATION:  09/06/2005  DATE OF DISCHARGE:                                   CONSULTATION   REFERRING PHYSICIAN:  Madelin Rear. Sherwood [name], M.D.   REASON FOR CONSULTATION:  Abdominal pain.   HISTORY OF PRESENT ILLNESS:  [name] is a 65 year old, African-American  female who presented on admission with profound weakness as well as  abdominal pain, nausea and vomiting.  Upon admission, she was found to have  a hemoglobin of 7.4 with hematocrit of 28.1 and MCV of 80.7.  She reports  she has had worsening dyspepsia for the last 3 months.  She describes  odynophagia and pain along her esophagus which she states is a burning type  pain.  She has tried Pepto-Bismol which has helped some.  She feels like  food just sits on her stomach.  She had been on Protonix b.i.d. which was  working very well for her until a few months it was not covered by her  insurance.  She then switched to Nexium 40 mg daily and reports very little  relief from her symptoms.  She has also been on prednisone for the last 5  months.  She reports a 5 pound weight loss in the last 6 months.  Denies any  rectal bleeding or melena.  She has refractory heartburn symptoms.  Symptoms  are severe and waking her at night.  She complains of water brash as well.  She denies any dysphagia.  She denies any aspirin or NSAID use.  She denies  any abdominal pain similar to what she had with pancreatitis.  She does  report a bleeding occasional right lower quadrant pain that she has had  since she had her rectocele and cystocele repair.  Her bowel movements have  been normal, soft and brown.  She does take a rare laxative.   She had a CT of the abdomen and pelvis which revealed an  atheromatous  abdominal aorta.  There was mild prominence to the central intrahepatic bile  ducts and her pelvis was negative.   PAST MEDICAL HISTORY:  1.  Polyserositis.  2.  Systemic lupus Pemphigus vulgaris.  3.  Mucocutaneous candidiasis.  4.  Oropharyngeal herpes simplex virus-1.  5.  Chronic GERD with last EGD being December 25, 2004, which showed diffuse      gastritis.  She was H. pylori negative.  6.  Colonoscopy by [name] on March 03, 2005, which showed anal canal      hemorrhoids, but otherwise normal.  7.  History of pancreatitis secondary to sphincter of Oddi dysfunction.  8.  Status post ERCP with pancreatic stent and biliary sphincterotomy in May      2005.  9.  She had another ERCP on June 18, 2004, at [facility].  10. Status post cholecystectomy.  11. Status  post hysterectomy.  12. Status post lumbar spinal surgery several years ago.  13. Status post bilateral salpingo-oophorectomy many years ago.  14. Recent cystocele and rectocele repair on May 24, 2005, by Dr.      Emelda [name].   MEDICATIONS PRIOR TO ADMISSION:  1.  Valtrex, she does not recall the dose.  2.  Morphine sulfate 60 mg b.i.d.  3.  Norvasc 10 mg daily.  4.  Synthroid 0.125 mg daily.  5.  Phenergan p.Sandran.  6.  Allegra p.Sandran.  7.  Nexium 40 mg daily or Prilosec 20 mg daily.  8.  Duke's Magic Mouthwash p.Sandran.   ALLERGIES:  SULFA and MYCELEX.  She is allergic to EGGS.   FAMILY HISTORY:  Noncontributory.   SOCIAL HISTORY:  [name] is single and has never been married.  She has  four children in good health.  She lives with one of her sons.  She denies  any alcohol, tobacco or drug use.   REVIEW OF SYSTEMS:  CONSTITUTIONAL:  See HPI.  She denies any fevers or  chills.  CARDIOVASCULAR:  Denies any chest pain or palpitations.  PULMONARY:  Denies any shortness of breath, dyspnea, cough or hemoptysis.  GASTROINTESTINAL:  See HPI.   PHYSICAL EXAMINATION:  VITAL SIGNS:  Weight is as stated at  107.7 pounds,  temperature 97.6, pulse 77, respirations 20, blood pressure 128/91.  O2  saturations 96% on 2 L per minute.  GENERAL:  [name] is a 65 year old, African-American female who is alert,  pleasant and cooperative in no acute distress.  HEENT:  Sclerae clear, nonicteric pink.  Oropharynx pink and moist.  She  does have a erythematous ulceration at the tip of her tongue.  No visible  Candida.  Oropharynx is otherwise clear.  NECK:  Supple without any masses or thyromegaly.  HEART:  Regular rate and rhythm with normal S1, S2.  LUNGS:  Clear to auscultation bilaterally.  ABDOMEN:  Positive bowel sounds x4.  No bruits auscultated.  Soft,  nondistended.  She does have mild tenderness to right lower quadrant to deep  palpation.  No rebound tenderness or guarding.  No hepatosplenomegaly or  mass.  EXTREMITIES:  Without clubbing or edema bilaterally.   LABORATORY DATA AND X-RAY FINDINGS:  WBC 7.2, hemoglobin 12.9, hematocrit  38.6, platelets 426.  Calcium 9.1, sodium 133, potassium 3.8, chloride 99,  CO2 26, BUN 17, creatinine 0.9, glucose 132.  From December 27, total  bilirubin 0.3, direct bilirubin 0.1, indirect bilirubin 0.2, Alk phos 104,  AST 30, ALT 18, total protein 7.1.  Amylase 174, lipase 24.   IMPRESSION:  [name] is a 65 year old, African-American female with  history of polyserositis, systemic lupus erythematosus, Pemphigus vulgaris.  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  She has history of chronic gastroesophageal reflux disease.  More recently, she reports refractory gastroesophageal reflux disease  symptoms including heartburn, water brash and odynophagia with a burning  sensation.  She had history of gastritis back in April of last year.  She  had been off proton pump inhibitor for awhile due to insurance reasons.  She is also on prednisone.  Upon admission, she had a hemoglobin of 7.4 which is  up to normal  after 2 units of packed red blood cells.  I suspect she could  have peptic ulcer disease or erosive esophagitis to account for her  dyspepsia.  Of course, given her polyserositis she is at risk for vasculitis  and ischemic disease as well.  Her computed tomography scan was relatively  unremarkable.   RECOMMENDATIONS:  1.  NPO past midnight for EGD tomorrow with [name].  This has been      discussed with her including risks and benefits which are included to,      but no limited to bleeding, infection, drug reaction and she agrees with      this plan and consent will be obtained.  2.  Hemoccult stools x3.  3.  Further recommendations to follow.      Nicholas Lose, N.P.      Jonathon Bellows, M.D.  Electronically Signed    KC/MEDQ  D:  09/06/2005  T:  09/06/2005  Job:  [id number]

## 2011-01-22 NOTE — Consult Note (Signed)
NAME:  [name], [name]                            ACCOUNT NO.:  [id number]   MEDICAL RECORD NO.:  [id number]                   PATIENT TYPE:  INP   LOCATION:  A322                                 FACILITY:  [facility]   PHYSICIAN:  Lionel December, M.D.                 DATE OF BIRTH:  Aug 17, 1946   DATE OF CONSULTATION:  12/20/2002  DATE OF DISCHARGE:                                   CONSULTATION   REASON FOR CONSULTATION:  Intractable upper abdominal pain, nausea and  vomiting.   HISTORY OF PRESENT ILLNESS:  [name] is a 65 year old African-American  female who was admitted to Dr. Geanie Logan services earlier today for further  treatment and evaluation of her acute and chronic abdominal pain. [name]  has had upper abdominal pain off and on for many years, but she has been  having more or less constant pain over the last four weeks. She was begun on  Prevpac sometime last week. She noted worsening pain. She was seen by Dr.  Nobie Putnam yesterday in the office. Her amylase was mildly elevated. Lipase  was normal. Her AST and ALT were also mildly elevated. She had upper  abdominal ultrasound which showed fatty liver but no evidence of  pancreatitis or dilated bile duct. The patient is being admitted for pain  control, etc. She has been on hydrocodone lately which is not controlling  her pain. She complains of frequent nausea with intermittent vomiting;  however, she has not vomited any coffee-ground material or blood. Her bowels  generally are normal, but earlier this week, she has had nonbloody diarrhea.  Her pain is across her upper abdomen. It is described as intense throbbing  pain. At times, it eases but has not gone away in the last one month. She  does not have a good appetite. She states she has lost 10 or 12 pounds. She  has not experienced any fever or chills. She denies taking NSAIDS. She had  been on Prevpac as well as Glucophage which was discontinued by Dr.  Nobie Putnam, but she  has not improved any. She is on Amaryl 2 mg q.d., Nystatin  suspension swish and swallow four times a day for oral ulcers or  candidiasis, Vicodin 5/500 one to two q.4h. p.r.n., and Phenergan 25 mg  q.6h. p.r.n.   PAST MEDICAL HISTORY:  Long history of upper abdominal pain. She had EGD by  me on 12/23/00. She had gastritis but no evidence of peptic ulcer disease.  Biopsy showed mild diffuse chronic inflammation with a single lymphoid  aggregate, but there were no microorganisms. She was treated for  helicobacter pylori infection about three years ago. Retreatment was  attempted recently as above. She had another EGD by Dr. Lovell Sheehan on 12/05/01.  It showed gastritis with edema to pyloric channel but no evidence of pyloric  stenosis. She also had colonoscopy the same day which  showed internal  hemorrhoids. She did have a CLOtest at the time of EGD, but the records are  not part of this record. She has hypothyroidism, history of GERD. She had  open cholecystectomy back in 1978 or 1979. She had hysterectomy in 1978. She  has had BSO in 1980s and had lumbar spine surgery for disk prolapse in the  80s. Finally, she previously has been diagnosed with lupus and has seen Dr.  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 She also has NIDDM.   ALLERGIES:  SULFA which causes skin swelling.   FAMILY HISTORY:  Noncontributory. Both parents are in fair health. She has  11 siblings without significant or chronic problems that she is aware of.   SOCIAL HISTORY:  She is single. 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  She has four children; they are in good health. She smoked cigarettes for  maybe two years when she was in her twenties. She does not drink alcohol.   PHYSICAL EXAMINATION:  GENERAL:  Reveals a well-developed, African-American  female who appears to be in distress from pain.  VITAL SIGNS:  Admission weight 137 pounds.  She is 5 feet tall. Pulse 100 per  minute. Blood pressure 159/85, respiratory rate 18, and temperature is 99.3.  HEENT:  Conjunctivae is pink. Sclerae is nonicteric. Oropharyngeal mucosa  reveals two ulcers over hard palate, one covered with blood, but there is no  active bleeding.  NECK:  Without masses or thyromegaly.  CARDIAC EXAM:  Regular rate and rhythm, normal S1 and S2; no murmurs, rubs,  or gallops noted.  LUNGS:  Clear to auscultation.  ABDOMEN:  Symmetrical. Bowel sounds are normal. Very soft. She has mild to  moderate tenderness mid epigastrium and below the right costal margin;  however, no organomegaly or masses noted.  RECTAL:  Exam is deferred.  EXTREMITIES:  She does not have peripheral edema or clubbing, but her nails  are deformed and rudimentary. This is either due to prior fungal infection  or her skin disease. She has patchy pigmentation of her trunk.   LABORATORY DATA:  From today, WBC 2.5, H and H is 10 and 29.8, platelet  count is 261,000. She has 52 neutrophils, 31 lymphs, 12 monocytes, and 5  eosinophils. Sodium is 140, potassium 3.9, chloride 106, CO2 26, glucose  101, BUN 5, creatinine 0.7. Bilirubin is 0.4, AP 93, AST 88, ALT 90, total  protein 6.5 with albumin of 3.5. Calcium 9.3. Serum amylase is mildly  elevated at 231, and lipase is upper limit of normal at 51. Ultrasound done  earlier today shows fatty liver. No evidence of pancreatitis or dilated bile  duct.   Review of her old records reveal WBC was 4.5 in 5/01, at which time her AST  and ALT were normal.   ASSESSMENT:  [name] is a 65 year old female who presents with  unrelenting upper abdominal pain and associated nausea and vomiting. She has  not experienced any fever. 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 She does have mildly elevated serum amylase with normal lipase and mildly elevated AST and ALT with a normal  bilirubin and alkaline  phosphatase. Her bile duct is normal caliber on  ultrasound performed earlier today.   I suspect she could have peptic ulcer disease or pancreatitis, __________  biliary colic; however, I would expect that  she should have dilated bile  duct by now since she has been having this pain off and on for four weeks.  It is also positive that her mildly elevated transaminases are due to  steatohepatitis and unrelated to her acute illness.   RECOMMENDATIONS:  The pain will be treated with IV morphine sulfate. We will  start her on Protonix 40 mg IV q.24h. She will be NPO for now and undergone  diagnostic esophagogastroduodenoscopy. If EGD is negative for peptic ulcer  disease, will proceed with abdominal CT with attention to pancreas.   I would like to thank Dr. Nobie Putnam for the opportunity to participate in the  care of this nice lady.                                               Lionel December, M.D.    NR/MEDQ  D:  12/20/2002  T:  12/20/2002  Job:  [id number]

## 2011-01-22 NOTE — Consult Note (Signed)
NAMEBreda, Bond NO.:  [id number]   MEDICAL RECORD NO.:  [id number]                   PATIENT TYPE:   LOCATION:                                       FACILITY:   PHYSICIAN:  Aundra Dubin, M.D.            DATE OF BIRTH:   DATE OF CONSULTATION:  06/20/2003  DATE OF DISCHARGE:                                   CONSULTATION   CHIEF COMPLAINT:  Lupus.   Ms. Simer reports that she had achiness all over.  She has used some of the  Flexeril but not much.  She is still not sleeping well.  Her hands hurt  along with the shoulder and neck area.  She feels that her skin is doing  fine.  Her weight is stable.   MEDICINES:  1. Prednisone 5 mg daily.  2. Fosamax 70 mg weekly.  3. Synthroid 0.1 mg daily.  4. Calcium with vitamin D daily.  5. Valtrex 500 mg b.i.d.  6. Carafate.   PHYSICAL EXAMINATION:  VITAL SIGNS:  Weight 141 pounds, blood pressure  160/90, respirations 16.  GENERAL:  She is in no distress.  SKIN:  Her skin continues to look quite good.  She does not have the red  malar-type rash to her face that she had several years ago.  She has  dystrophic changes to the fingernails that appear to be possible fungal.  She has some swelling around the cuticles of the distal fingers more or less  at the DIPs.  LUNGS:  Clear.  HEART:  Regular with no murmur.  Lower extremity with no edema.  MUSCULOSKELETAL:  The fingers are not swollen other than that described at  the DIP area.  Wrists, elbows, shoulders good range of motion.  The knees  have poor range of motion to 90 degrees per usual.  Ankles and feet are  nontender.   ASSESSMENT AND PLAN:  1. Lupus.  Overall, she is stable.  I believe I need to keep her on     prednisone 5 mg a day for the time being.  When she is more into the     hands of the next rheumatologist they want to decrease this.  Overall I     believe she is fairly stable.  We will check a CBC and CMET today.  2.  Pemphigus vulgaris.  3. Mild increased blood pressure.   I have discussed with Ms. Keetch that my last clinic at [facility] is July 04, 2003.  She will discuss with Dr. Sherwood Gambler who should follow up with her as a  rheumatologist or dermatologist.  I do not accept her type of Medicaid  insurance at my [place] office.  She will return on a p.r.n. basis.      ___________________________________________  Aundra Dubin, M.D.   WWT/MEDQ  D:  06/20/2003  T:  06/20/2003  Job:  [id number]   cc:   Madelin Rear. Sherwood Gambler, M.D.  [address]  [place]  [place] [address]  Fax: [phone]

## 2011-01-22 NOTE — Consult Note (Signed)
NAME:  [name], [name]                  ACCOUNT NO.:  [id number]   MEDICAL RECORD NO.:  [id number]          PATIENT TYPE:  INP   LOCATION:  A326                          FACILITY:  [facility]   PHYSICIAN:  Lionel December, M.D.    DATE OF BIRTH:  August 12, 1946   DATE OF CONSULTATION:  10/16/2004  DATE OF DISCHARGE:                                   CONSULTATION   REASON FOR CONSULTATION:  Stomatitis, esophagus, dehydration.   HISTORY OF PRESENT ILLNESS:  [name] is a 65 year old black female with  history of pemphigus vulgaris, lupus, oral herpes simplex virus, oral  candidiasis, who was admitted with severe stomatitis, esophagus and  dehydration. For the last 1 week she has complained of a rash that has  erupted throughout her body. She describes the rash initially looked like a  blister, however, they quickly erupted and now have eschar. The rash in on  her upper and lower extremities as well as her trunk.  Most of these are  covered with eschar. She also has lesions on her lips and in the buccal  cavity as well as in her throat. She states that when she wakes up in the  morning her mouth has dried blood in it.  It is very painful to eat and to  swallow. She says it hurts in the middle of her chest when she takes a deep  breath. She has had a couple of episodes of vomiting each day since  Wednesday. She has not vomited today however.  She denies any abdominal  pain. She has constipation, generally has a bowel movement every 3 to 4  days.  When she has a painful bowel movement she may see bright red blood  per rectum.  Denies any melena. She reports seeing a rheumatologist after  her last hospitalization at [facility] in  October, 2005. She was tried on Plaquenil but quit this because she had  burning of the eye. She did not follow up with her rheumatologist.  Otherwise she is not on any treatment for her autoimmune disorders.   On admission she was found to  have a white count of 3000, hemoglobin 8.7,  hematocrit 25.1, platelets 278,000. MET-7 normal.  Liver function tests  normal except albumin 3.2.  She is known to have anemia previously most consistent with anemia of  chronic disease. Hemoglobin in December was 9.4. +   MEDICATIONS PRIOR TO ADMISSION:  1.  Nystatin 1-2 tsp. q.i.d.  2.  Swish and swallow q.i.d. since December, 2005.  3.  Norvasc 10 mg daily.  4.  Synthroid 0.025 mg daily.  5.  Protonix 40 mg b.i.d.  6.  Zoloft 50 mg daily.  7.  Lactulose p.r.n.   ALLERGIES:  SULFA, MYCELEX.   PAST MEDICAL HISTORY:  1.  History of lupus.  2.  Pemphigus vulgaris.  3.  History of mucocutaneous candidiasis.  4.  History of oropharyngeal herpes simplex virus, type 1.  5.  Gastroesophageal reflux disease.  6.  Non-insulin-dependent diabetes mellitus.  7.  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 A stent was noted to have passed spontsaneously on  of EGD by      Dr. Karilyn Cota in May, 2005. She has since had ERCP on June 18, 2004 at      which time the ductal anatomy appeared normal. This was done at [facility].   PAST SURGICAL HISTORY:  1.  Cholecystectomy.  2.  Hysterectomy.  3.  Lumbar spinal surgery several years ago.  4.  Bilateral salpingo-oophorectomy many years ago.   FAMILY HISTORY:  Noncontributory.   SOCIAL HISTORY:  She is single and has never been married. She has four  children who are in good health.  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  She lives with one of her sons. She does not drink alcohol and does not  smoke cigarettes for more than 30 years.   REVIEW OF SYSTEMS:  See history of present illness for GI. CARDIOPULMONARY:  Denies shortness of breath but complains of retrosternal chest pain with a  deep  breath. CONSTITUTIONAL:  Complains of weight loss, 15 more pounds since  December, 2005.   PHYSICAL EXAMINATION:  VITAL SIGNS:  Temperature 98.5, pulse 90,  respirations 20, blood pressure 117/74. Weight 113.5 down from 128 in  December, 2005.  GENERAL:  A pleasant, well-developed, well-nourished black female in no  acute distress.  SKIN:  Warm and dry. No jaundice. She has discreet scattered lesions on her  upper and lower extremities as well as on her upper trunk. Lesions are of  different sizes and appear to be in different stages. Most of these do have  eschar associated with them. There is no surrounding erythema and no  drainage.  HEENT:  Conjunctivae are pale, sclerae non icteric. Oropharyngeal mucosa  moist and pink. There is dried blood on her lips. She has multiple lesions  which appear to be ulcerations on the inner lips as well as buccal cavity.  CHEST:  Clear to auscultation.  CARDIAC:  Regular rate and rhythm, no murmurs, rubs, or gallops.  ABDOMEN:  Positive bowel sounds. Soft, nontender, nondistended, no  organomegaly or masses, no rebound tenderness or guarding.  EXTREMITIES:  No edema.  SKIN EXAM:  See above.   LABORATORY DATA:  As mentioned in the HPI, in addition sodium is 137,  potassium 4.2, BUN 9, creatinine 1, glucose 101, total bilirubin 0.3,  alkaline phosphatase 73, AST 18, ALT 12, albumin 3.2, total CK 21, CK-MB  less than 0.3.   IMPRESSION:  The patient is a 65 year old lady who was admitted with  stomatitis, esophagus and dehydration. Suspect the dehydration is due to  poor oral intake. She has significant stomatitis on examination. She also  has skin lesions which are probably related to her pemphigus vulgaris. Oral  lesions may be related to this as well. However we need to rule out active  oral herpes simplex virus.  If she does have active HSV, Prednisone therapy may worsen her symptoms. I doubt that she has superimposed oral candidiasis  given  chronic Nystatin therapy. It is unclear at this time the extent of her  oral lesions, whether she is having any extension into her esophagus. She  also has anemia mostly due to anemia of chronic disease and  probably her  underlying autoimmune disorder. Anemia studies are pending.  She has had no  overt GI bleed. She also complains of weight loss which may be in part due  to poor oral intake although she reports an approximately 65 pound weight  loss over the last couple of years.   PLAN:  1.  Will check sedimentation rate, HSV titers, including IGG and IGM, oral      swabs looking for intranuclear inclusions which are suggestive of HSV.  2.  Continue Duke's mixture.  3.  Follow up of anemia studies.  4.  Further recommendations to follow.      LL/MEDQ  D:  10/16/2004  T:  10/16/2004  Job:  [id number]

## 2011-01-22 NOTE — Discharge Summary (Signed)
NAME:  Sirois, Kieren                  ACCOUNT NO.:  [id number]   MEDICAL RECORD NO.:  [id number]          PATIENT TYPE:  INP   LOCATION:  A301                          FACILITY:  [facility]   PHYSICIAN:  Madelin Rear. Sherwood Gambler, MD  DATE OF BIRTH:  04/28/1946   DATE OF ADMISSION:  11/18/2005  DATE OF DISCHARGE:  03/29/2007LH                                 DISCHARGE SUMMARY   DISCHARGE DIAGNOSES:  1.  Systemic lupus erythematosus with vasculitis.  2.  Pneumonia.  3.  Pemphigus vulgaris   DISCHARGE MEDICATIONS:  Resume usual medications including Fentanyl patch  q.3 days.   HOSPITAL COURSE:  The patient was admitted with intractable abdominal pain.  She also had a cough and congestion.  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  She responded to bowel rest and  steroids increased in dose.  Her pneumonitis responded well to antibiotics.  On the day of discharge she was stable.      Madelin Rear. Sherwood Gambler, MD  Electronically Signed     LJF/MEDQ  D:  01/11/2006  T:  01/11/2006  Job:  [id number]

## 2011-01-22 NOTE — H&P (Signed)
NAME:  [name], [name]                  ACCOUNT NO.:  [id number]   MEDICAL RECORD NO.:  [id number]          PATIENT TYPE:  INP   LOCATION:  A327                          FACILITY:  [facility]   PHYSICIAN:  Madelin Rear. Sherwood Gambler, MD  DATE OF BIRTH:  Jul 02, 1946   DATE OF ADMISSION:  08/26/2006  DATE OF DISCHARGE:  LH                              HISTORY & PHYSICAL   CHIEF COMPLAINT:  Abdominal pain and nausea.   HISTORY OF PRESENT ILLNESS:  The patient has a longstanding history of  lupus and pemphigus vulgaris and frequent admissions as well as  outpatient treatments for polyserositis and abdominal pain.  She  developed recurrent symptoms for 1-2 weeks and presented to the office  with progressively increasing pain and poor oral intake.  She denied any  hematemesis, hematochezia, or melena.  No fever, rigors or chills.  No  headache.  No neurologic symptoms.   PAST MEDICAL HISTORY:  In addition to that listed above:  1. Previous Helicobacter pylori treatment.  2. Hypothyroidism.  3. Diabetes mellitus.  4. 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  5. Hypertension.   SOCIAL HISTORY:  Nonsmoker, nondrinker.  No illicit drug use.   FAMILY HISTORY:  Noncontributory.   REVIEW OF SYSTEMS:  As under HPI, all else negative.   PHYSICAL EXAMINATION:  SKIN:  Unremarkable.  In fact, her skin is quite  clear from her pemphigus standpoint.  HEAD AND NECK:  Showed no JVD or adenopathy.  Neck is supple.  CHEST: Clear.  CARDIAC: Regular rhythm without murmur, gallop or rub.  ABDOMEN: She had diffuse abdominal tenderness, but no organomegaly or  masses were detected.  There was no guarding or rebound noted.  Extremities: Without clubbing, cyanosis, or edema noted.  NEUROLOGIC:  Exam nonfocal.  VITAL SIGNS:  In the office, she was mildly hypotensive and experienced  subjective orthostatic dizziness.  Her systolic blood pressure in the  office was 85.  She evidenced no arrhythmias and had a  regular sinus  rhythm.   LABORATORY STUDIES:  Baseline anemia 9.0 grams percent. Hemoglobin  normochromic normocytic was noted.  Platelet count was normal.  White  count was normal.  Electrolytes were unrevealing except for mild  hyperglycemia at 137.  Liver function tests were unremarkable.  Her  amylase was elevated; however, her lipase was normal.   X-rays were obtained upon admission and revealed no acute disease,  moderate amount of feces through the colon. No obstruction or free air  was noted per radiology interpretation.   IMPRESSION:  Recurrent polyserositis, primarily of the abdominal cavity  secondary to flare of lupus.  The usual regimen for her that works very  well as Solu-Cortef being chosen for its neuronal corticoid effect and  the blood pressure elevation aspect.  She will be continued on her usual  regimens when clinically she is able to tolerate.  Adequate analgesia  will be achieved with parenteral doses of opiates.  1. Hypertension, obviously not a problem with her low blood pressure      but will be monitored in expectant observation and  intervention as      needed.  2. Hypothyroidism.  She was recently found to be euthyroid on      outpatient labs. No acute treatment, just discontinue her usual      dose of medication.  3. Pemphigus, obviously not a problem at present; but if a flare      occurs, will intervene as needed.      Madelin Rear. Sherwood Gambler, MD  Electronically Signed     LJF/MEDQ  D:  08/27/2006  T:  08/28/2006  Job:  [id number]

## 2011-01-22 NOTE — Op Note (Signed)
NAME:  [name], [name]                            ACCOUNT NO.:  [id number]   MEDICAL RECORD NO.:  [id number]                   PATIENT TYPE:  AMB   LOCATION:  DAY                                  FACILITY:  [facility]   PHYSICIAN:  Lionel December, M.D.                 DATE OF BIRTH:  [birth year]   DATE OF PROCEDURE:  02/04/2004  DATE OF DISCHARGE:                                 OPERATIVE REPORT   PROCEDURE:  Esophagogastroduodenoscopy with a __________ duodenoscope.   INDICATION:  [name] is a 65 year old African-American female with multiple  medical problems, who also has history of frequent pancreatitis.  She was  evaluated at [facility] and felt to have sphincter of Oddi dysfunction.  She had  biliary and possibly pancreatic sphincterotomy, and she had 5 French pigtail  catheter left in place.  Dr. Opal Sidles had requested that this stent be  removed in about 2 weeks.  The study was done on Jan 15, 2004.  She still  complains of epigastric right upper quadrant pain.  She states it has not  gone away completely but is definitely less intense.  Procedure risks were  reviewed with the patient.  Informed consent was obtained.   PREOPERATIVE MEDICATIONS:  1. Cetacaine spray for pharyngeal topical anesthesia.  2. Demerol 50 mg IV.  3. Versed 6 mg IV.   FINDINGS:  Procedure performed in endoscopy suite.  The patient's vital  signs and O2 saturations were monitored during the procedure and remained  stable.  The patient was placed in left lateral decubitus position and  Olympus video scope was passed via oropharynx without any difficulty into  esophagus.  Mucosa of the esophagus normal.  Squamocolumnar junction was  unremarkable.  The stomach had some bile in it, but there was no food  debris.  Folds and mucosa were normal.  Scope was passed.  Mucosa at body,  antrum, pyloric channel, as well as angularis, fundus, and cardia was  normal.  The scope was passed into __________ descending duodenum.  I  did  not see any stent in place.  This scope was removed and duodenoscope was  advanced into second part of the duodenum.  Ampulla of Vater was easily  identified.  There was free flow of bile into the duodenum.  However, there  was no stent in place.  It was felt that she had passed the stent  spontaneously.  Endoscope was withdrawn.  The patient tolerated the  procedure well.   FINAL DIAGNOSES:  1. The patient has passed pancreatic stents spontaneously.  2. Normal-appearing ampulla with prior evidence of sphincterotomy.   RECOMMENDATIONS:  1. She will continue her usual medications.  She has recurrent evidence of     pancreatitis, she will need to be reevaluated with ERCP at [facility].      ___________________________________________  Lionel December, M.D.   NR/MEDQ  D:  02/04/2004  T:  02/04/2004  Job:  [id number]   cc:   Madelin Rear. Sherwood Gambler, M.D.  [address]  [place]  [place] [address]  Fax: [phone]

## 2011-01-22 NOTE — H&P (Signed)
NAME:  [name], [name]                  ACCOUNT NO.:  [id number]   MEDICAL RECORD NO.:  [id number]          PATIENT TYPE:  INP   LOCATION:  A303                          FACILITY:  [facility]   PHYSICIAN:  Patrica Duel, M.D.    DATE OF BIRTH:  Feb 14, 1946   DATE OF ADMISSION:  05/20/2006  DATE OF DISCHARGE:  LH                                HISTORY & PHYSICAL   CHIEF COMPLAINT:  Diffuse muscle pain and trouble swallowing.   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  She also has depression and hypertension.  She  has been admitted multiple times for intractable pain from vasculitis  involvement of her GI tract, recurrent glossitis and esophagitis.  She was  discharged in May of this year and sent briefly to a nursing facility, and  she was discharged for unknown reasons.  The patient presented to the office  on two occasions complaining of relatively mild dysphagia and diffuse pain.  She was treated symptomatically.  I saw her three days ago and placed her on  prednisone 10 mg b.i.d.   Patient presented to the emergency department with the same complaints.  She  underwent a lab evaluation which revealed mild leukocytosis with a white  count of 2900, hemoglobin 9.6, hematocrit 28, platelet count of 292,000.  She has a predominance of lymphocytes and monocytes.  Sed rate was 103.  Chemistries normal except for glucose of 106 and creatinine of 1.3.  Given  the intractable nature of her symptoms, the patient is admitted for pain  control, steroid bolus, and further evaluation and therapies as indicated.   Patient's primary complaint is dysphagia involving the upper esophageal  region.  She has had no symptoms of obstruction.  She also hurts all over.  She has no headache, neurologic deficits, shortness of breath, chest pain,  palpitations, syncope, abdominal  pain, except for intermittent, nausea,  vomiting, diarrhea, melena, hematemesis, hematochezia, or genitourinary  symptoms.   CURRENT MEDICATIONS:  1. Prednisone 10 mg b.i.d.  2. Synthroid 125 mg daily.  3. Atenolol 25 b.i.d.  4. Simvastatin 20 daily.  5. Lasix 20 daily.  6. Dilaudid 4 mg q.4h. p.r.n.  7. Protonix 40 b.i.d.  8. Metoclopramide 5 q.i.d., ac/hs.  9. Lyrica 75 t.i.d.   ALLERGIES:  SULFA.   PAST MEDICAL HISTORY:  As noted.  She also has compensated hypothyroidism,  hyperlipidemia, and question of diabetes, although she is normoglycemic in  the recent past.   PAST SURGICAL HISTORY:  Significant for hysterectomy.   REVIEW OF SYSTEMS:  Negative except as mentioned.   FAMILY HISTORY:  Noncontributory.   SOCIAL HISTORY:  Nonsmoker, nondrinker.  She does live at home and is  independent but marginally so.   PHYSICAL EXAMINATION:  VITAL SIGNS:  Temp 97 degrees, BP 114/76, pulse 72,  respirations 20, O2 sat 98%.  GENERAL:  This is a tearful female who is alert and oriented.  HEENT:  Normocephalic and atraumatic.  Pupils are equal.  Ears, nose, and  throat are benign.  There is no glossitis or injection of the pharynx.  NECK:  Supple.  No masses, thyromegaly, lymphadenopathy, or bruits.  LUNGS:  Clear to A&P.  HEART:  Heart sounds are normal without murmurs, rubs or gallops.  ABDOMEN:  Nontender, nondistended.  Bowel sounds are intact.  EXTREMITIES:  No clubbing, cyanosis or edema.  NEUROLOGIC:  Nonfocal.   ASSESSMENT:  Intractable pain secondary to lupus vasculitis, consider other,  although this has been a very typical presentation for her.  She is anemic  as well as leukopenic, probably on the basis of lupus.  No evidence of  gastrointestinal bleeding.   PLAN:  Admit for empiric IV steroids, analgesia, and further evaluation of  therapy as indicated.  Will follow and treat expectedly.      Patrica Duel, M.D.  Electronically Signed     MC/MEDQ  D:   05/20/2006  T:  05/20/2006  Job:  [id number]

## 2011-01-22 NOTE — Discharge Summary (Signed)
NAME:  [name], [name]                  ACCOUNT NO.:  [id number]   MEDICAL RECORD NO.:  [id number]          PATIENT TYPE:  INP   LOCATION:  A201                          FACILITY:  [facility]   PHYSICIAN:  Madelin Rear. Sherwood Gambler, MD  DATE OF BIRTH:  [birth year]   DATE OF ADMISSION:  09/01/2005  DATE OF DISCHARGE:  01/18/2007LH                                 DISCHARGE SUMMARY   DISCHARGE DIAGNOSES:  1.  Thoracolumbar radiculitis.  2.  Lupus polyserositis with abdominal pain, chronic, possible probable      adhesions pelvic with chronic pelvic pain.  3.  Hypothyroidism.  4.  Diabetes mellitus type 1, probably secondary to medications.   DISCHARGE MEDICATIONS:  1.  Diflucan 100 mg p.o. daily.  2.  Lasix 20 mg p.o. daily.  3.  Lantus insulin 10 units subcu daily.  4.  Synthroid 125 mcg p.o. daily.  5.  Medrol dose pack.  6.  Reglan 5 mg p.o. four times a day.  7.  Tenormin 25 mg 1/2 p.o. b.i.d.  8.  Protonix 40 mg p.o. b.i.d.  9.  Lyrica 75 mg p.o. t.i.d.  10. Zocor 20 mg p.o. daily.  11. Carafate suspension 2 tsp four times a day.  12. Duke's mixture 2 tsp swish and swallow four times a day.   HOSPITAL COURSE:  The patient was admitted with chief complaint of abdominal  pain that was refractory with vomiting.  She also had right-sided lower  quadrant pain and right posterior flank and back pain.  She responded well  to IV steroids, supportive measures and clear liquids.  She was seen in  consultation by GYN.  Eventually when her flank and back pain resolved on  Lyrica, MRI showed considerable degenerative changes anatomically consistent  with the distribution of her pain.  She continued to have right lower  quadrant tenderness.  She had one episode of chest pain in house evaluated  by cardiology.  There was no evidence of pericarditis or myocardial  infarction.  She was felt to probably require catheterization, but due to  her multiple medical problems and poor risk status, that was placed on  hold  since her symptoms resolved.  She was seen by GYN who agrees that possibly  adhesional lysis by  laparoscope may end up being done in the near future.  We decided to let her  stabilize from her serositis and back pain issues and arrange this as an  outpatient.   FOLLOW UP:  Follow up in the office in 1 week.      Madelin Rear. Sherwood Gambler, MD  Electronically Signed     LJF/MEDQ  D:  09/23/2005  T:  09/23/2005  Job:  [id number]

## 2011-01-22 NOTE — Op Note (Signed)
NAME:  [name], [name]                  ACCOUNT NO.:  [id number]   MEDICAL RECORD NO.:  [id number]          PATIENT TYPE:  INP   LOCATION:  A336                          FACILITY:  [facility]   PHYSICIAN:  R. Roetta Sessions, M.D. DATE OF BIRTH:  [birth year]   DATE OF PROCEDURE:  09/07/2005  DATE OF DISCHARGE:                                 OPERATIVE REPORT   PROCEDURE:  Incomplete diagnostic esophagogastroduodenoscopy.   INDICATIONS FOR PROCEDURE:  A 65 year old lady with numerous medical  problems admitted to the hospital with acute on chronic anemia who complains  of odynophagia, some dysphagia, right lower quadrant abdominal pain which is  now more or less resolving. She is complaining more of right flank pain. EGD  is now being done. This approach has been discussed with the patient at  length. Potential risks, benefits, and alternatives have been reviewed and  questions answered. She is agreeable. Please see documentation in the  medical record.   PROCEDURE NOTE:  O2 saturation, blood pressure, pulse, and respirations were  monitored throughout the entire procedure. Conscious sedation with Versed 3  mg IV and Demerol 75 mg IV in divided doses.   INSTRUMENT:  Olympus video chip system.   FINDINGS:  Examination of the hypopharynx revealed hemangioma-like appearing  lesion extending into the mucosa of the hypopharynx down into the  cricopharyngeal mucosa. Please see photos. Examination of the tubular  esophagus revealed no mucosa abnormalities. EG junction easily traversed.   Stomach:  Gastric cavity was full of food. Stomach was full of collards  consumed yesterday at lunch. Exam was therefore incomplete. However, I did  note the pylorus was widely patent. The patient tolerated the brief  procedure well and was reactive to endoscopy.   IMPRESSION:  1.  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 Otherwise normal  esophageal      mucosa.  2.  Stomach full of food as described above. Patent pylorus. Examination of      the upper gastrointestinal tract otherwise incomplete.  3.  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   RECOMMENDATIONS:  1.  Add low-dose Reglan 5 mg a.c. to her regimen for now. Continue Protonix.  2.  Follow up on CT of the abdomen and pelvis which has been ordered to      further evaluate right flank pain.  3.  Would consider ENT evaluation of the hypopharyngeal lesion seen today.      [name], M.D.  Electronically Signed     RMR/MEDQ  D:  09/07/2005  T:  09/07/2005  Job:  [id number]

## 2011-01-22 NOTE — H&P (Signed)
NAMEGAILENE, [name]                            ACCOUNT NO.:  [id number]   MEDICAL RECORD NO.:  [id number]                   PATIENT TYPE:  INP   LOCATION:  A212                                 FACILITY:  [facility]   PHYSICIAN:  Madelin Rear. Sherwood Gambler, M.D.             DATE OF BIRTH:  April 21, 1946   DATE OF ADMISSION:  01/08/2004  DATE OF DISCHARGE:                                HISTORY & PHYSICAL   CHIEF COMPLAINT:  Cough, malaise, and fever.   HISTORY OF PRESENT ILLNESS:  The patient was seen at [facility] to  have biliary manometry performed by a physician there.  Prior to the  procedure, he noted a fever, and sent the patient to my office with no  interphysician consultation.  I evaluated the patient on an emergent basis,  and noted that she has had recurrent cough and sputum production for several  days prior to her elective procedure.  She denied any hemoptysis.  Denied  shortness of breath or chest pain.   PAST MEDICAL HISTORY:  Complicated.  Includes hypothyroidism, severe lupus,  and pemphigus vulgaris, severe mucositis, and moniliasis of the mouth,  pharynx, and esophagus.  Also a recent episode of pulmonary infection.   SOCIAL HISTORY:  Noncontributory.   FAMILY HISTORY:  Noncontributory.   REVIEW OF SYSTEMS:  Positive for some diarrhea the day of the emergency room  visitation, as well as recurrent abdominal pain, for which she was supposed  to get her manometry performed.  It is anticipated she will show some  biliary dyskinesia problem.   PHYSICAL EXAMINATION:  GENERAL:  She appeared acutely ill, but not toxic.  HEAD AND NECK:  No JVD or adenopathy.  Neck supple.  CHEST:  Scattered rhonchi in all fields.  CARDIAC:  Regular rhythm without gallop or rub.  ABDOMEN:  Epigastric and right upper quadrant tenderness, which is her  baseline.  There is no guarding or rebound.  No organomegaly or masses.  EXTREMITIES:  Trace bipedal edema.  NEUROLOGIC:  Nonfocal.   IMPRESSION:   Severe bronchitis, possible early pneumonitis.   PLAN:  The patient will be admitted for IV antibiotics, continued anti-  fungal therapy with Diflucan.  Other problems are stable at present.  She  will be monitored expectantly in the hospital for any problems, and  hopefully get over this pulmonary infection and have her procedure done when  she is clinically stable.     ___________________________________________                                         Madelin Rear. Sherwood Gambler, M.D.   LJF/MEDQ  D:  01/11/2004  T:  01/11/2004  Job:  [id number]

## 2011-01-22 NOTE — H&P (Signed)
NAME:  [name], [name]                  ACCOUNT NO.:  [id number]   MEDICAL RECORD NO.:  [id number]          PATIENT TYPE:  INP   LOCATION:  A301                          FACILITY:  [facility]   PHYSICIAN:  Madelin Rear. Sherwood Gambler, MD  DATE OF BIRTH:  08/04/1946   DATE OF ADMISSION:  11/18/2005  DATE OF DISCHARGE:  LH                                HISTORY & PHYSICAL   CHIEF COMPLAINT:  Abdominal pain and vomiting.   HISTORY OF PRESENT ILLNESS:  The patient has recurrent abdominal pain and  vomiting. She has a long complicated past medical history. However, this is  a recurrence of similar symptoms she has had in the past. She denies any  hematemesis, hematochezia, melena, fevers, rigors or chills. She just  diffuse epigastric abdominal pain associated with repetitive vomiting and  poor oral intake.   PAST MEDICAL HISTORY:  1.  Systemic lupus erythematosus.  2.  Pemphigus vulgaris.  3.  Previous polyserositis.  4.  Previous severe mucositis.   SOCIAL HISTORY:  Nonsmoker, nondrinker. No alcohol or drug use.   FAMILY HISTORY:  Noncontributory.   REVIEW OF SYSTEMS:  As under HPI. All else negative.   PHYSICAL EXAMINATION:  GENERAL:  She appears distressed secondary to nausea  but no toxic vital signs.  HEENT:  Head and neck:  No JVD or adenopathy. Neck is supple. Her mucosal  membranes are normal.  CHEST:  Clear.  CARDIAC:  Regular rhythm without murmurs, rubs, or gallops.  ABDOMEN:  Soft. Subjective epigastric abdominal tenderness. No guarding or  rebound. No organomegaly or masses.  EXTREMITIES:  Without clubbing, cyanosis, or edema.  NEUROLOGICAL:  Unremarkable.  SKIN:  Remarkable for notable scarring and hyperpigmented areas secondary to  previous pemphigus out breaks.   IMPRESSION:  Flare of polyserositis. Bring in for IV steroids, IV hydration,  symptomatic care, antacids. Laboratories to be reviewed when available and  expected observation.      Madelin Rear. Sherwood Gambler, MD  Electronically Signed     LJF/MEDQ  D:  11/18/2005  T:  11/19/2005  Job:  [id number]

## 2011-01-22 NOTE — Consult Note (Signed)
NAME:  [name], [name]                  ACCOUNT NO.:  [id number]   MEDICAL RECORD NO.:  [id number]          PATIENT TYPE:  INP   LOCATION:  A303                          FACILITY:  [facility]   PHYSICIAN:  J. Darreld Mclean, M.D. DATE OF BIRTH:  December 10, 1945   DATE OF CONSULTATION:  05/24/2006  DATE OF DISCHARGE:                                   CONSULTATION   Dr. Sherwood Gambler asked me to see the patient in regards to back pain.   The patient is a 65 year old female with history of lupus erythematosus with  marked pain in her back.  CT scan done on September 14 reveals central  compression fractures superior end plate at T4, age indeterminate.  The  patient complains of significant pain and tenderness in her back  from her  neck down to her mid lower back.  No paresthesias.  She is in acute flareup  of her lupus.  She denies any direct falls.  She has had lupus since 2001  and has been in acute flareup recently.  Neurologically, she is intact. The  patient is on prednisone.  She is scheduled for a bone density study later  today.   The patient declines to get out of bed.  She is in significant pain, muscle  spasms to the back.  She is tender diffusely on the back, more particularly  around the mid to upper thoracic area.  Reflexes are normal.  CNS is intact.  Straight leg raise is negative. Swelling of both hands and deformities.   The age of the fracture is indeterminate, but from her history, she has had  pain for about a month and is consistent with recent fracture.  I would like  to get a bone spec scan to ascertain that, bone density.  I am sure she is  significantly osteoporotic secondary to being on the prednisone.  This can  be treated with the analgesics.  We are going to ask physical therapy to see  her and get recommendations from them.  She needs CASH brace for a period of  time.  I do not think she would be very amenable at this point to a  kyphoplasty.   I will follow with you.   Sincerely,           ______________________________  Shela Commons. Darreld Mclean, M.D.     JWK/MEDQ  D:  05/24/2006  T:  05/24/2006  Job:  [id number]

## 2011-01-22 NOTE — H&P (Signed)
NAME:  [name], [name]                  ACCOUNT NO.:  [id number]   MEDICAL RECORD NO.:  [id number]          PATIENT TYPE:  INP   LOCATION:  A209                          FACILITY:  [facility]   PHYSICIAN:  Corrie Mckusick, M.D.  DATE OF BIRTH:  1945/12/20   DATE OF ADMISSION:  01/24/2006  DATE OF DISCHARGE:  LH                                HISTORY & PHYSICAL   ADMISSION DIAGNOSIS:  Recurrent abdominal pain and fatigue as well as chest  pain this time.   A 64 year old female with longstanding history of lupus, pemphigus vulgaris,  previous polyserositis, previous severe mucositis and history of atypical  chest pain who presents again with left-sided chest pain, cough, vomiting  and abdominal pain.  I saw her in the office, trying to see if we could keep  her as an outpatient but she wanted to come back in the hospital.  She was  recently discharged on Jan 10, 2006, with the same diagnosis.  I spoke with  the patient at this time about possible nursing home placement.   CURRENT MEDICATIONS:  1.  Atenolol 25 mg half p.o. b.i.d.  2.  Lasix 20 mg p.o. t.i.d.  3.  Lyrica 75 mg p.o. t.i.d.  4.  Metoclopramide 5 mg p.o. q.i.d.  5.  Prednisone 10 mg p.o. daily.  6.  Protonix 40 mg p.o. daily.  7.  Simvastatin 20 mg p.o. daily.  8.  Synthroid 125 p.o. daily.   ALLERGIES:  SULFA, __________, DIAZIDE, LOOP DIURETICS.   For past medical history, past surgical history, social history and family  history, please see the admission H&P on December 28, 2005.   PHYSICAL EXAMINATION:  GENERAL APPEARANCE:  When I saw her, she was crying  in the office, complaining of abdominal pain and vomiting.  VITAL SIGNS:  Temperature 98.6, blood pressure 72, respirations 16, blood  pressure 92/58, weight 108.5.  HEENT:  Nasopharynx clear with moist mucous membranes.  NECK:  Supple.  CHEST:  Clear to auscultation bilaterally.  CARDIOVASCULAR:  Regular rate and rhythm with normal S1 and S2, no murmurs.  ABDOMEN:  Bowel  sounds positive.  Soft, tender in the mid epigastric area.  EXTREMITIES:  No edema.   ASSESSMENT/PLAN:  A 65 year old female with multiple comorbidities as stated  above with her current abdominal pain, vomiting and now she states left-  sided chest pain.   PLAN:  1.  Admit to __________ for close monitoring and telemetry.  2.  Will get cardiology once again involved.  3.  Will get GI once again involved.  4.  Will work on pain management, will continue to follow closely.      Corrie Mckusick, M.D.  Electronically Signed     JCG/MEDQ  D:  01/24/2006  T:  01/24/2006  Job:  [id number]

## 2011-01-22 NOTE — Discharge Summary (Signed)
NAME:  [name], [name]                              ACCOUNT NO.:  [id number]   MEDICAL RECORD NO.:  [id number]                  PATIENT TYPE:   LOCATION:                                       FACILITY:   PHYSICIAN:  Madelin Rear. Sherwood Gambler, M.D.             DATE OF BIRTH:   DATE OF ADMISSION:  DATE OF DISCHARGE:  01/15/2004                                 DISCHARGE SUMMARY   DISCHARGE DIAGNOSES:  1. Lupus.  2. Candidal esophagitis and mucositis.  3. Bronchitis.  4. Chronic abdominal pain question etiology.  5. Pemphigus vulgaris.   DISCHARGE MEDICATIONS:  1. Levaquin 500 mg p.o. daily.  2. Diflucan 100 mg p.o. b.i.d.  3. Prednisone 10 mg p.o. daily.  4. Protonix 40 mg p.o. b.i.d.   SUMMARY:  The patient was admitted with a cough, malaise and fever.  This  cough was productive and began several days prior to an elective procedure,  i.e., ERCP with biliary manometry which was about to be performed at [facility].  She was sent over to the office, that day, after  discovering a fever at the [facility] campus.  She was evaluated in the  office and directly admitted for her evident pulmonary infection.  She  responded well to IV antibiotics and was continued on Diflucan for a  previous severe esophagitis and monilial stomatitis.  Her lupus remained  stable. Her respiratory status improved adequately and she was subsequently  discharged, improved, and back to baseline.  She was also seen in  consultation by gastroenterology during her hospital stay.  Follow up in  office.     ___________________________________________                                         Madelin Rear. Sherwood Gambler, M.D.   LJF/MEDQ  D:  02/07/2004  T:  02/08/2004  Job:  [id number]

## 2011-01-22 NOTE — H&P (Signed)
[name], [name]                            ACCOUNT NO.:  [id number]   MEDICAL RECORD NO.:  [id number]                   PATIENT TYPE:  INP   LOCATION:  A201                                 FACILITY:  [facility]   PHYSICIAN:  Kirk Ruths, M.D.            DATE OF BIRTH:  16-Aug-1946   DATE OF ADMISSION:  03/17/2004  DATE OF DISCHARGE:                                HISTORY & PHYSICAL   CHIEF COMPLAINT:  Epigastric pain.   PRESENTING ILLNESS:  This is a 65 year old female with a longstanding  complications from lupus erythematosus.  The patient was discharged three  days before this re-admission after an extended stay for abdominal pain and  back pain related to chronic pancreatitis.  The patient states she woke up  this morning on admission having some substernal epigastric-type chest pain  with her usual nausea.  She was seen and evaluated in the emergency room  where her enzymes were normal as well as her EKG.  The patient was  empirically placed on an nitroglycerin drip along with a heparin drip with  some improvement in her discomfort.  The patient has been seen by  [facility] in the past and denies any previous cardiac  problems.   PAST MEDICAL HISTORY:  This lady, in addition to the lupus erythematosus,  has chronic problem with diarrhea and vomiting associated with some  sphincter of Oddi dysfunction for which she has had stents placed at [facility]  before.  The patient has hypothyroidism, significant GERD, status post  cholecystectomy and labile hypertension.   CURRENT MEDICATIONS:  The patient's current medications at the time of this  admission are:  1. Norvasc 10 mg.  2. Synthroid 100 mcg daily.  3. Protonix b.i.d.  4. Colace.  5. Dilaudid.   The patient denies constipation or diarrhea.  She does admit to chronic  GERD, abdominal pain with chronic nausea and she has had some mild right  scapular pain which has been worked up on a previous admission  without  significant findings.   REVIEW OF SYSTEMS:  The patient's review of systems is noncontributory.  She  denies shortness of breath or recent diarrhea.   PHYSICAL EXAMINATION:  GENERAL:  An elderly black female who appears  miserable as usual.  VITAL SIGNS:  Pulse is 98 and regular, blood pressure is 130/70,  respirations are 20 and unlabored and she is afebrile.  HEENT:  TM's are normal.  Pupils equal, round, reactive to light and  accommodation.  Oropharynx benign.  NECK:  Supple without JVD or bruits or thyromegaly.  LUNGS:  Clear in all areas.  HEART:  A regular sinus rhythm without murmur, gallop or rub.  There is some  mild substernal tenderness, some mild to moderate epigastric tenderness.  EXTREMITIES:  Without cyanosis, clubbing or edema.  SKIN:  Without lesions.   ASSESSMENT:  1. Chest pain probably related to gastroesophageal reflux  disease.  Will     rule out myocardial infarction and get cardiology consult and     echocardiogram.  2. Labile hypertension.  Will add labetalol to her regimen of Norvasc.  3. Chronic gastroesophageal reflux disease.  Will add Carafate slurry to her     b.i.d. Protonix and Dilaudid for pain.     ___________________________________________                                         Kirk Ruths, M.D.   WMM/MEDQ  D:  03/17/2004  T:  03/17/2004  Job:  [id number]

## 2011-01-22 NOTE — H&P (Signed)
NAME:  [name], [name]                  ACCOUNT NO.:  [id number]   MEDICAL RECORD NO.:  [id number]          PATIENT TYPE:  INP   LOCATION:  A336                          FACILITY:  [facility]   PHYSICIAN:  Madelin Rear. Sherwood Gambler, MD  DATE OF BIRTH:  1946/01/30   DATE OF ADMISSION:  09/01/2005  DATE OF DISCHARGE:  LH                                HISTORY & PHYSICAL   CHIEF COMPLAINT:  Abdominal pain.   HISTORY OF PRESENT ILLNESS:  The patient presents with recurrent abdominal  pain and vomiting for two weeks. She states she has been off of steroids for  three months in treatment of her systemic lupus erythematosus,  polyserositis, and pemphigus vulgaris. She noted some blood streaked emesis  after repetitive vomiting but no gross large amounts of heme. She denied  melena specifically. She has experienced abdominal pain diffusely across the  upper epigastric area without radiation.   PAST MEDICAL HISTORY:  1.  Lupus.  2.  Pemphigus vulgaris.  3.  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   SOCIAL HISTORY:  Nonsmoker, nondrinker. No alcohol use.   FAMILY HISTORY:  Noncontributory.   REVIEW OF SYSTEMS:  As above. Otherwise in detail negative. She has had no  glossitis, stomatitis or esophagitis symptoms which she has had in the past.   PHYSICAL EXAMINATION:  GENERAL:  She appears acutely distress. She is  cachectic appearing.  HEART:  Her head and neck showed no JVD or adenopathy.  NECK:  Supple.  CHEST:  Clear.  CARDIAC:  Regular rhythm with no gallop or rub.  ABDOMEN:  Diffusely tender. No organomegaly or masses. No guarding or  rebound tenderness.  EXTREMITIES:  No clubbing, cyanosis, or edema.  NEUROLOGICAL:  Nonfocal.   IMPRESSION:  1.  Polyserositis, probably causing abdominal pain. Laboratories did not      include an amylase, lipase and LFTs, and these will be added on and      reviewed when available.  2.  Leukopenia and severe anemia. The patient  will require a transfusion      which we will institute now. She will be checked for ongoing GI losses      as well with hemoccult stools. Serial hematocrits and transfuse p.r.n.      Protein pump inhibitor therapy will be initiated parenterally.      Madelin Rear. Sherwood Gambler, MD  Electronically Signed     LJF/MEDQ  D:  09/01/2005  T:  09/01/2005  Job:  [id number]

## 2011-01-22 NOTE — Discharge Summary (Signed)
NAMECRYSTALE, GIANNATTASIO                  ACCOUNT NO.:  [id number]   MEDICAL RECORD NO.:  [id number]          PATIENT TYPE:  INP   LOCATION:  A209                          FACILITY:  [facility]   PHYSICIAN:  Patrica Duel, M.D.    DATE OF BIRTH:  November 06, 1945   DATE OF ADMISSION:  12/16/2004  DATE OF DISCHARGE:  04/21/2006LH                                 DISCHARGE SUMMARY   DISCHARGE DIAGNOSES:  1.  Atypical chest pain.  2.  Recurrent mucositis/esophagitis status post thorough gastrointestinal      evaluation.  3.  Systemic lupus erythematosus.  4.  Pemphigus.  5.  Significant depression.  6.  Diabetes mellitus type 2.  7.  Anemia of chronic disease.   HISTORY OF PRESENT ILLNESS:  For details regarding admission, please refer  to the admitting note. Briefly, this very unfortunate 65 year old female  with the above history had been admitted several times with intractable  mucositis. She had done well for the past several weeks. She has responded  to steroids in the past. She presented to the emergency department with the  sudden onset of chest and epigastric abdominal pain associated with apparent  severe anxiety. Emergency room workup was unrevealing. She was admitted with  chest and abdominal pain of questionable etiology.   HOSPITAL COURSE:  The patient was treated routinely with rule out myocardial  infarction protocol that was negative. Dr. Karilyn Cota was consulted. Ultimately,  esophagogastroduodenoscopy was done to confirm non-helicobacter gastritis.  She responded well to PPI's, Nystatin, steroids, etc. She was stable for  discharge the 10th hospital day.   DISCHARGE MEDICATIONS:  1.  Morphine sulfate 60 mg b.i.d.  2.  Norvasc 10 q.d.  3.  Xanax 0.5 q. 6 p.r.n.  4.  Zoloft 50 q.d.  5.  Diflucan 150 p.r.n.  6.  Valtrex 1 gram p.o. b.i.d.  7.  Prednisone 10 mg p.o. q.d.   FOLLOW UP:  She will be followed and treated expectantly as an outpatient.      MC/MEDQ  D:  01/16/2005  T:   01/17/2005  Job:  [id number]

## 2011-01-22 NOTE — Op Note (Signed)
NAME:  [name], [name]                  ACCOUNT NO.:  [id number]   MEDICAL RECORD NO.:  [id number]          PATIENT TYPE:  INP   LOCATION:  A209                          FACILITY:  [facility]   PHYSICIAN:  Lionel December, M.D.    DATE OF BIRTH:  1946-08-22   DATE OF PROCEDURE:  12/25/2004  DATE OF DISCHARGE:                                 OPERATIVE REPORT   PROCEDURE:  Esophagogastroduodenoscopy.   INDICATION:  [name] is a 65 year old Afro-American female with multiple  medical problems, who was admitted with chest and epigastric pain over a  week ago.  She ruled out for myocardial infarct.  She has been treated  aggressively for GERD and PUD but but getting better slowly.  She had chest  and abdominal CT yesterday.  She has a soft tissue density of cardia  suspicious for a mass.  This is new since previous study of October 2005.  She is therefore undergoing diagnostic EGD.  The procedure risks were  reviewed with the patient, informed consent was obtained.   PREMEDICATION:  Cetacaine spray for pharyngeal topical anesthesia, Demerol  50 mg IV, Versed 6 mg IV in divided dose.   FINDINGS:  Procedure performed in endoscopy suite.  The patient's vital  signs and O2 saturation were monitored during procedure and remained stable.  The patient was placed in the left lateral recumbent position and the  Olympus video scope was passed via oropharynx without any difficulty into  esophagus.   Esophagus:  Mucosa of the esophagus was normal.  Squamocolumnar junction was  at 35 cm.  No hernia, ring or stricture was noted.   Stomach:  It had some bile in it, but there was no food debris.  The stomach  distended very well with insufflation.  Folds of the proximal stomach were  normal.  Examination of the mucosa revealed more or less diffuse granularity  and erythema.  There was some nodularity at antrum but no erosions or ulcers  noted.  Biopsy was taken from the antrum for routine histology.  Pyloric  channel was patent.  Angularis, fundus and cardia were examined by  retroflexing the scope and revealed similar changes.   Duodenum:  Bulbar mucosa was normal.  Scope was passed to the second part of  duodenum, where mucosa and folds were normal.  Endoscope was withdrawn.  The  patient tolerated the procedure well.   FINAL DIAGNOSIS:  Diffuse changes of gastritis.  Biopsy taken from antral  mucosa for routine histology.  No evidence of mass at the cardia.   RECOMMENDATIONS:  1.  Will continue double-dose PPI and Carafate.  2.  We will advance the diet to a low-fat diet. H. pylori serology will also      be checked.      NR/MEDQ  D:  12/25/2004  T:  12/25/2004  Job:  [id number]   cc:   Corrie Mckusick, M.D.  Fax: [phone]

## 2011-01-22 NOTE — Consult Note (Signed)
NAMEZOANNE, NEWILL                              ACCOUNT NO.:  [id number]   MEDICAL RECORD NO.:  [id number]                   PATIENT TYPE:   LOCATION:                                       FACILITY:   PHYSICIAN:  Aundra Dubin, M.D.            DATE OF BIRTH:   DATE OF CONSULTATION:  04/18/2003  DATE OF DISCHARGE:                                   CONSULTATION   CHIEF COMPLAINT:  1. History of lupus.  2. Pemphigus vulgaris.  3. Polyarthralgia.   When I saw the patient on March 28, 2003 I felt that she may be mildly  adrenally insufficient.  She was having some fatigue along with mild nausea.  She reports that that is some better.  Her weight is stable and she is down  1 pound.  She reports, again, today that she is aching all over.  She has  nonrestorative sleep.  She occasionally has headaches. She still has some  mouth sores, but this seems to be not progressing.  She still complains of  some vesicles to the lower extremities.   MEDICINES:  1. Prednisone 5 mg daily.  2. Hydrocodone b.i.d.-t.i.d.  3. Other medicines are unchanged from March 28, 2003.   PHYSICAL EXAMINATION:  VITAL SIGNS:  Weight 138 pounds.  Blood pressure  120/80, respiratory rate 16.  GENERAL:  She is in no distress.  MOUTH:  The tongue remains coated.  SKIN:  The red rash to her face that was prominent several years ago is  essentially not there.  She does have some red areas to the lower  extremities.  LUNGS:  Clear.  HEART:  Regular.  No murmur.  MUSCULOSKELETAL: The hands and wrists show no swelling and are nontender.  Elbows, shoulders, ankles and feet have a good range of motion and show no  synovitis.  The right knee has flexion only to about 85 degrees which is  what I have seen before.  The left knee flexes to 120 degrees.  Both knees  are tender. Trigger points at the elbows, shoulder, neck, occiput, anterior  chest, and upper paraspinous muscles had mild-to-moderate tenderness.   ASSESSMENT AND PLAN:  1. Polyarthralgia insomnia.  I am not seeing active swelling at this time.     She has a good strong pain medicine with the hydrocodone. I have started     her on Flexeril with a small amount and she might increase to 10-20 mg     h.s. p.r.n.  2. Fatigue.  I am still not quite sure if she did have adrenal     insufficiency.  I will lower the prednisone to 5 mg a day     and hopefully stop this after she returns.  3. Pemphigus vulgaris.   She will return in 2 months.  Aundra Dubin, M.D.    WWT/MEDQ  D:  04/18/2003  T:  04/18/2003  Job:  [id number]   cc:   Madelin Rear. Sherwood Gambler, M.D.  [address]  [place]  [place] [address]  Fax: [phone]

## 2011-01-22 NOTE — Consult Note (Signed)
NAME:  [name], [name]                            ACCOUNT NO.:  [id number]   MEDICAL RECORD NO.:  [id number]                   PATIENT TYPE:  INP   LOCATION:  A322                                 FACILITY:  [facility]   PHYSICIAN:  Ladona Horns. Mariel Sleet, MD               DATE OF BIRTH:  [birth year]   DATE OF CONSULTATION:  DATE OF DISCHARGE:                                   CONSULTATION   ATTENDING PHYSICIAN:  Patrica Duel, M.D.   CONSULTING PHYSICIAN:  Ladona Horns. Mariel Sleet, M.D.   DIAGNOSES:  1. Leukopenia.  2. Anemia consistent with anemia of chronic disease.  3. Abdominal pain, unclear etiology.  4. History of lupus erythematosus versus possible pemphigus vulgaris.  5. History of Helicobacter pylori gastritis.  6. History of hypothyroidism.  7. History of a hysterectomy in the past when they took out her uterus only;     two years later, they went back and took out both ovaries and fallopian     tubes, she states.  8. History of lumbar laminectomy years ago by Dr. Corlis Hove in     [place].  9. History of cholecystectomy as well as appendectomy.   ALLERGIES:  HISTORY OF SULFONAMIDE ALLERGY WHICH GIVES HER SKIN SWELLING AND  ITCHING.   HISTORY OF PRESENT ILLNESS:  This is a pleasant 65 year old lady whose  ancestry is part American-Indian, part African-American and part Caucasian,  who was admitted with abdominal pain x1 week, epigastrically located.  Workup revealed H. pylori and she was treated with a Prevpac, however, this  was not tolerated and she experienced increasing epigastric abdominal pain,  nausea and vomiting,and was brought to the hospital.   She has had increased sugars, only usually after steroid administration.   She thinks that some of her symptoms may relate back to the use of metformin  at one time in the past.   She has been admitted and is presently on her scheduled diltiazem, she is  also on Carafate, Synthroid 100 mcg, she takes oral Nystatin and she  has had  yeast in her mouth supposedly which is now better.  She is on insulin,  sliding scale, she is also on Dilaudid in a PCA pump fashion, she is on 80  mg of prednisone a day in the form of methylprednisolone 80 mg q.6h., she is  also on Protonix 40 mg once a day and she is on an antacid on a p.r.n. basis  along with Tylenol on a p.r.n. basis, Phenergan on a p.r.n. basis 25 mg  q.6h. and she is on some stool softeners in the form of Colace, Xanax on a  p.r.n. basis 0.25 mg, Reglan on a q.6h. p.r.n. basis 10 mg for nausea or  vomiting.   It is not clear from this lady's history.  She states that she has had both  lupus as a diagnosis and then she has  also said that she has had pemphigus  vulgaris as a diagnosis and it is indicated on the chart that she might have  both.   She states that at one time she saw Dr. Kellie Simmering, but it has been two years,  she states, since she saw him.  She at one time sought a second opinion at  [facility], saw a rheumatologist only once she states and then was released  from the clinic.   PAST MEDICAL HISTORY:  Her past medical history is as mentioned above.   SOCIAL HISTORY:  She has never been married, she has three children by one  gentleman and one child by a second gentleman.  She used to work but is  unemployed presently.   HABITS:  She does not smoke, she does not drink alcohol, she states.   FAMILY HISTORY:  She is one of 12 children.  Nobody has lupus in her family  she is aware of.   REVIEW OF SYSTEMS:  She has not seen blood in her stools presently, but she  states that she did have a little blood in her stool about a year and a half  ago and had a colonoscopy at one time, she thinks.   She has not had fevers.  She has had a rash across her cheeks and the bridge  of her nose ever since she first presented, she states.  She has also had a  skin biopsy by Dr. Nita Sells here in town and was told that this was lupus  as well.   PHYSICAL  EXAMINATION:  GENERAL:  A young 65 year old woman who looks her  stated age, she is in no acute distress.  VITAL SIGNS:  She is afebrile presently.  Respirations 16 and unlabored.  Her blood pressure is 140/80.  HEENT:  She has teeth that are in fair repair at best, tongue is normal in  the midline.  NECK:  Supple.  LYMPH NODES:  Negative throughout.  HEART:  Regular rhythm and rate, without murmurs, rubs or gallops.  LUNGS:  Clear to auscultation and percussion.  BACK:  She has no CVA tenderness at this time.  BREASTS:  Negative for masses.  ABDOMEN:  She has no hepatosplenomegaly.  EXTREMITIES:  She has perhaps some little thickening of the MCP joints  bilaterally but there is no active inflammation and she has good range of  motion of her elbow joints and knee joints.  Pulses are 1-2+ and  symmetrical.  She has no peripheral edema.  SKIN:  She does have a malar rash that is bright red.  She has numerous  hyperpigmented scarred areas on her skin, back, abdomen, chest, arms, etc.   This lady's leukopenia, which was 2500 on admission, is now 6300 on  steroids.  Her hemoglobin was 10.0 grams on admission, it is down to 9.6  grams.  Her platelets have been normal at 361,000 and have remained in that  same range.  Her sed rate was 80 on admission, it is 68 today.  Her  reticulocyte count is low at 33,800.  She did have an elevated SGOT and SGPT  to 88 and 90, respectively with the ranges being 0-37 for the SGOT and 0-40  for the SGPT.   Her RPR is nonreactive, her B12 level was normal, folic acid level normal,  serum iron was 43, TIBC low at 265, percent saturation was 16% consistent  with anemia of chronic disease.   Amylase was 231 on admission,  it has improved to 170 by 12/21/02.  Her lipase  was 51 on admission which is well within the normal range, at the top end of  the normal range and repeat the next day was 35.  Her TSH was normal, T4 was normal, T3 uptake was 42.5%.    ASSESSMENT:  This lady has a picture most likely consistent with leukopenia  from her underlying disease and an anemia consistent with anemia of chronic  disease.   PLAN:  I would like to get a ferritin level on her, however, that may or may  not be that helpful since she has an elevated sed rate and this may at times  be an acute phase reactant or may go up during the acute inflammatory  process.   I think she is certainly improving, she feels better on the steroids which I  would continue.   I do not think she needs a bone marrow aspirate and biopsy at this time.   I will see her in the office at some point in the future, but I will follow  her while she is here in the hospital.                                               Ladona Horns. Mariel Sleet, MD    ESN/MEDQ  D:  12/24/2002  T:  12/25/2002  Job:  [id number]   cc:   Patrica Duel, M.D.  [address]  [place]  [place] [address]  Fax: [phone]

## 2011-01-22 NOTE — Consult Note (Signed)
NAMEMARLYNE, [name]                              ACCOUNT NO.:  [id number]   MEDICAL RECORD NO.:  [id number]                   PATIENT TYPE:   LOCATION:                                       FACILITY:   PHYSICIAN:  Aundra Dubin, M.D.            DATE OF BIRTH:   DATE OF CONSULTATION:  03/28/2003  DATE OF DISCHARGE:                                   CONSULTATION   CHIEF COMPLAINT:  Mouth sores, possible lupus.   HISTORY OF PRESENT ILLNESS:  [name] is a 65 year old woman whom I have  worked with for about two years and last saw at [facility] on July 27, 2001.  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  I was not helping her at that time and  felt that she needed a referral to a university system.  A note received  from [facility] dated October 13, 2001, indicates a diagnosis of  pemphigus vulgaris.  The plan at that time was to treat her with prednisone  90 mg a day.  She feels that the rash is better.  At the present time she  has some small vesicles to her legs.  She has remained on Mycelex troche and  occasionally has been treated with Diflucan for her mouth ulcers.  Her  weight is significantly down from 2002, and I can document that she has lost  25 pounds.   At this time [name] says that her whole body is aching.  There is some  nonrestorative sleep.  Her fingers feel very stiff.  She has lost the  fingernails.  She continues to have the mouth ulcers.  She has headaches.  She has had some bloody nasal discharge.  She brings with her a bottle that  outlines a decrease in prednisone to stopping it, and she has been off for  about one week.  She feels that since this time she is significantly more  fatigued and does not have any appetite.  There have been really no swollen  joints or fever.   PAST MEDICAL HISTORY:  The past medical history is complicated.  She has had  a positive ANA in the past.   She was also hospitalized in April 2004 at [facility]  because of a long-term problem with abdominal pain.  A CT scan of Feb 04, 2003, of the sinuses indicates normal paranasal sinuses.  She had a small-  bowel series on December 25, 2002, which was normal.  A CT of the abdomen with  contrast on December 22, 2002, shows normal lung bases.  There was mild diffuse  fatty infiltration of the liver.  Most of her abdominal organs were normal  in appearance, and the report indicated that she was stable, and the  impression was an unremarkable CT scan.  The  pelvis CT scan was also normal.   MEDICINES:  1. Valtrex 500 mg b.i.d.  2. Synthroid 100 mcg daily.  3. Mycelex troche q.i.d.  4. Hydrocodone p.r.n.  5. Sumycin mouthwash p.r.n.  6. Stool softener.  7. Protopic 0.1% p.r.n.  8. Clobetasol cream p.r.n.  9. Carafate three times daily.   FAMILY HISTORY:  Father has had a history of an enlarged heart.  Her mother  has had a rare blood disease.   SOCIAL HISTORY:  She has lived most of her life in [place].  She  has been a Therapist, art in the past.  She attended [employer] for at least a year.  She does not smoke or drink alcohol.   PHYSICAL EXAMINATION:  VITAL SIGNS:  Weight 139 pounds.  Blood pressure  130/80, respiratory rate 16.  GENERAL:  She is in no distress but does not appear to feel well.  SKIN:  She has some hyperpigmented area to the right side of her face.  There are also some small, basically pinpoint vesicles to the left anterior  leg.  HEENT:  PERRL/EOMI.  Mouth:  There is a fairly dense moss to her tongue.  There is some slight whitish discharge to the sides of her mouth.  I believe  this is consistent with Candida.  NECK:  Negative JVD.  LUNGS:  Clear.  HEART:  Regular.  With no murmur.  ABDOMEN:  Negative.  Hepatosplenomegaly nontender.  MUSCULOSKELETAL:  Fingernails appear to possibly have a fungal infection.  The fingernails are practically gone on most fingers.  The hands  are quite  stiff and probably have some mild arthritic changes.  They were not very  tender.  Wrists, elbows, and shoulders move with stiffness.  The knees are  quite stiff and have a poor range of motion, much as I found about in 2001.  There was poor flexion to about 90 degrees.  The ankles and feet were not  swollen and were nontender.  She had trace edema.  NEUROLOGIC:  Nonfocal.   ASSESSMENT AND PLAN:  1. History of lupus.  At this point I want to check a CBC, CMET, ANA, ESR,     and urinalysis.  She may be slightly adrenally insufficient with the     nausea and fatigue, and I have placed her back at this point on 10 mg of     prednisone each day.  I will hopefully be able to lower this on return.     I am not sure to what degree the lupus is active.  I await the results of     the laboratory work.  2. Oral candidiasis.  I have encouraged her to use her Mycelex troche and     other mouthwashes that she has and find helps with this.  I believe this     is a long-term difficult problem to help.  3. Pemphigus vulgaris.  4. Long history of abdominal pain.  She said in the interview that her belly     is not bothering her at this time.   She will return in a few weeks for further evaluation.                                               Aundra Dubin, M.D.    WWT/MEDQ  D:  03/28/2003  T:  03/28/2003  Job:  [id number]   cc:   [name], M.D.  [address], [address]  [place]  [place] [address]  Fax: [phone]

## 2011-01-22 NOTE — Discharge Summary (Signed)
   NAME:  [name], [name]                              ACCOUNT NO.:  [id number]   MEDICAL RECORD NO.:  [id number]                  PATIENT TYPE:   LOCATION:                                       FACILITY:   PHYSICIAN:  Madelin Rear. Sherwood Gambler, M.D.             DATE OF BIRTH:   DATE OF ADMISSION:  12/20/2002  DATE OF DISCHARGE:  12/27/2002                                 DISCHARGE SUMMARY   DISCHARGE DIAGNOSES:  1. Mucocutaneous thrush.  2. Systemic lupus erythematosus.  3. Hypothyroidism.  4. Abdominal pain and gastritis without hemorrhage.  5. Anemia of chronic disease.   DISCHARGE MEDICATIONS:  1. Protonix 40 mg b.i.d.  2. Darvocet-N 100 p.o. q.i.d. p.r.n. pain.  3. Prednisone on a tapering schedule down to her baseline of 5-10 mg daily.   SUMMARY:  The patient has known lupus erythematosus, __________ vulgaris,  treated H. Pylori, gastritis and hypothyroidism.  She came in with  intractable abdominal pain.  She also had problems with yeast infection,  i.e. oral thrush.   She was admitted for IV hydration and a more complete GI workup as well as  GI consultation.  She was seen in consultation by gastroenterology.  Her  proton pump inhibitor was increased and she was given analgesia which  gradually controlled her pain.   CONDITION ON DISCHARGE:  She was discharged, stable, for follow up in  office.                                               Madelin Rear. Sherwood Gambler, M.D.    LJF/MEDQ  D:  01/13/2003  T:  01/14/2003  Job:  [id number]

## 2011-01-22 NOTE — Discharge Summary (Signed)
NAME:  [name], [name]                  ACCOUNT NO.:  [id number]   MEDICAL RECORD NO.:  [id number]          PATIENT TYPE:  INP   LOCATION:  A305                          FACILITY:  [facility]   PHYSICIAN:  Madelin Rear. Sherwood Gambler, MD  DATE OF BIRTH:  [birth year]   DATE OF ADMISSION:  08/02/2004  DATE OF DISCHARGE:  12/06/2005LH                                 DISCHARGE SUMMARY   DISCHARGE DIAGNOSES:  1.  Systemic lupus erythematosus.  2.  Dehydration.  3.  Oropharyngeal Candidiasis.  4.  Vulgaris.  5.  Hypokalemia.  6.  Type 2 diabetes mellitus.  7.  Hypertension.  8.  Depression.  9.  Abdominal pain.  10. Hypothyroidism.   DISCHARGE MEDICATIONS:  1.  Phenergan p.r.n. suppositories.  2.  Reglan 10 mg p.o. q.i.d.  3.  Toradol 10 mg p.o. q.i.d. p.r.n.  4.  Diflucan 100 mg p.o. q.d.  5.  Medrol dose pack to be tapered.  6.  Restart prednisone at 5 mg a day over time.  7.  Nystatin oral suspension swish and swallow 2 tsp q.i.d.  8.  Roxicodone 20 mg p.o. q.i.d. p.r.n.  9.  Synthroid 100 mcg p.o. q.d.  10. Norvasc 10 mg p.o. q.d.  11. Dilaudid 2 mg p.o. q.4h. p.r.n. pain.  12. Levsin 0.125 mg p.o. q.i.d. p.r.n.  13. Lexapro 10 mg p.o. q.d.  14. Carafate 2 tsp q.i.d. p.o.  15. Xylocaine swish and expectorate q.i.d. 2% p.r.n.  16. Xanax 0.25 mg p.o. q.i.d. p.r.n.  17. Plaquenil 200 mg p.o. q.d.   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  She also noted severe odynophagia and was  noted to have oral thrush.  She was admitted for IV hydration, anti-Candidal  parenteral therapy.  She responded well to interventions and was  subsequently discharged in good condition for followup in the office.     Lawr   LJF/MEDQ  D:  09/01/2004  T:  09/01/2004  Job:  [id number]

## 2011-01-22 NOTE — Op Note (Signed)
NAME:  [name], [name]                            ACCOUNT NO.:  [id number]   MEDICAL RECORD NO.:  [id number]                   PATIENT TYPE:  INP   LOCATION:  A322                                 FACILITY:  [facility]   PHYSICIAN:  Lionel December, M.D.                 DATE OF BIRTH:  11-19-1945   DATE OF PROCEDURE:  12/20/2002  DATE OF DISCHARGE:                                 OPERATIVE REPORT   PROCEDURE:  Esophagogastroduodenoscopy.   ENDOSCOPIST:  Lionel December, M.D.   INDICATIONS:  The patient is a 65 year old African American female with a  four week history of pain across her upper abdomen with nausea and vomiting.  Her SGOT and SGPT are mildly elevated. Serum amylase is also mildly  elevated.  The lipase is normal.  Ultrasound done earlier today reveals no  abnormality to her pancreas and CBD is 7 mm.  She has had prior  cholecystectomy.  She is undergoing diagnostic EGD.  Procedure risks were reviewed and informed consent was obtained.   PREOPERATIVE MEDICATIONS:  Cetacaine spray for pharyngeal topical anesthesia  and Demerol 50 mg IV, Versed 5 mg IV in divided dose.   INSTRUMENT:  Olympus video system.   DESCRIPTION OF PROCEDURE:  The procedure was performed in the endoscopy  suite.  The patient's vital signs and oxygen saturation were monitored  during the procedure and remained stable.  The patient was placed in the  left lateral position and endoscope was passed via oropharynx without any  difficulty into the esophagus.   Esophagus:  The mucosa of the esophagus was normal.  Squamocolumnar junction  was unremarkable.   Stomach:  The stomach was empty and distended very well to insufflation.  Folds of the proximal stomach were normal.  Examination of the mucosa  revealed erythema in the body and antrum and a single antral erosion.  Pyloric channel was patent.  There was some prepyloric mucosal edema.  Angularis and fundus were examined by retroflexing the scope.  Angularis  was  normal.  There were two tiny submucosal nodules side-by-side just below the  cardia.  Appearance was either suggestive of a tiny leiomyoma or a lipoma.  These were left alone.   Duodenum was examined to the bulb.  The second and third part of the  duodenum was normal.  Endoscope was withdrawn.  The patient tolerated the  procedure well.   FINAL DIAGNOSES:  1. Erosive antral gastritis.  I do not feel this finding would explain her     symptoms.  2. Incidental finding of two small submucosal nodules at the gastric fundus     consistent with leiomyoma or lipoma.    RECOMMENDATIONS:  1. NPO except p.o. medications and ice chips.  2. She will have amylase, lipase, AST, ALT and CBC repeated in the a.m.  3. We will proceed with abdominal CT with attention to the pancreas.  Lionel December, M.D.    NR/MEDQ  D:  12/20/2002  T:  12/20/2002  Job:  [id number]   cc:   Patrica Duel, M.D.  [address]  [place]  [place] [address]  Fax: [phone]

## 2011-01-22 NOTE — H&P (Signed)
NAME:  [name], [name]                  ACCOUNT NO.:  [id number]   MEDICAL RECORD NO.:  [id number]          PATIENT TYPE:  INP   LOCATION:  A305                          FACILITY:  [facility]   PHYSICIAN:  Madelin Rear. Sherwood Gambler, MD  DATE OF BIRTH:  [birth year]   DATE OF ADMISSION:  08/02/2004  DATE OF DISCHARGE:  LH                                HISTORY & PHYSICAL   CHIEF COMPLAINT:  Abdominal pain and odynophagia.   HISTORY OF PRESENT ILLNESS:  The patient had progressively increasing  odynophagia, difficulty taking fluids.  She also had a recrudescence of her  chronic abdominal pain, describing a post-micturitional epigastric abdominal  pain.  She had no vomiting or diarrhea.  No hematemesis, hematochezia or  melena.   PAST MEDICAL HISTORY:  Past medical history is pertinent for biliary  dysfunction, status post sphincterotomy and stent placement with spontaneous  ejection of stent accomplished in May of 2005 by Dr. Steffanie Dunn of [facility].  Past medical history also pertinent for diabetes mellitus, type  2, hypertension, systemic lupus erythematosus and pemphigus vulgaris with  severe dermatitis exacerbations.  She also has chronic recurrent abdominal  pain.  Status post cholecystectomy.   SOCIAL HISTORY:  No drinking or smoking or alcohol use.   FAMILY HISTORY:  Family history is noncontributory.   REVIEW OF SYSTEMS:  Review of systems is as under HPI, all else is negative  with the exception of a severe major depression evident at this time.  She  is expressing a desire for an end of her suffering, although she did not  have active suicidal ideation.   PHYSICAL EXAMINATION:  GENERAL:  She has depressed affect.  HEAD AND NECK:  No JVD or adenopathy.  Neck is supple.  Mucocutaneous exam  reveals active thrush in the mouth right now.  CHEST:  Clear.  HEART:  Normal regular rhythm without any murmur, gallop or rub.  ABDOMEN:  Abdomen is soft with diffuse abdominal tenderness, no  guarding or  rebound, no organomegaly or masses.  EXTREMITIES:  Extremities without clubbing, cyanosis or edema.  NEUROLOGICAL:  Examination is nonfocal.   LABORATORY STUDIES:  Initial CBC revealed a 7.9 g% hemoglobin with a white  count of 5000, platelet count with clumping, but appeared adequate numbers  according to the laboratory report.  She had no left shift.  Electrolytes  revealed modest hyponatremia and hypokalemia of 131 and 3.0, respectively.  Liver function tests were normal.  Lipase was also mildly elevated at 59.   IMPRESSION:  1.  Dehydration with hyponatremia and hypokalemia.  Admit for intravenous      hydration and replenishment of electrolytes.  2.  Recurrent chronic abdominal pain, probably serositis secondary to lupus      flare.  She will be admitted for parenteral steroids, prophylactic      proton pump inhibitor.  3.  Diabetes.  Monitor sugars on steroids and cover with sliding scale.  4.  Hypertension.  Monitor expectantly.  5.  Oral esophageal candidiasis.  Administer parenteral and topical      antifungal therapy.  6.  Major depression.  This is clearly reactive to her chronic illness.  She      will have a consultation in-house with a chaplain as well as Behavioral      Health.     Lawr   LJF/MEDQ  D:  08/03/2004  T:  08/03/2004  Job:  [id number]

## 2011-01-22 NOTE — H&P (Signed)
NAME:  [name], [name]                            ACCOUNT NO.:  [id number]   MEDICAL RECORD NO.:  [id number]                   PATIENT TYPE:  INP   LOCATION:  A336                                 FACILITY:  [facility]   PHYSICIAN:  Corrie Mckusick, M.D.               DATE OF BIRTH:  09/24/1945   DATE OF ADMISSION:  12/04/2003  DATE OF DISCHARGE:                                HISTORY & PHYSICAL   ADMISSION DIAGNOSIS:  Recurrent oral stomatitis.   HISTORY OF PRESENT ILLNESS:  A 65 year old female with long standing history  of lupus status post steroid treatment who has had now recurrent stomatitis.  She has been to [facility] for this recurrent stomatitis and is followed very  closely there. 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  Pemphigus vulgaris as well. She has a history of hypothyroidism and GERD. On  a recent hospital stay, dermatology was again reconsulted. She had recently  seen Dr. Nobie Putnam and had been on p.o. Diflucan. She is readmitted now for  flare up.   Labs really otherwise not significant.   PAST MEDICAL HISTORY:  1. Pemphigus vulgaris.  2. Candida esophagitis.  3. Cutaneous lupus.  4. Hypothyroidism.   SOCIAL HISTORY:  Does not drink nor smoke.   FAMILY HISTORY:  Noncontributory.   MEDICATIONS:  1. Lidocaine p.o. p.r.n.  2. Vicodin p.o. p.r.n.  3. Synthroid unsure of dose p.o. q.d.  4. Nystatin swish and swallow q.i.d.   PHYSICAL EXAMINATION:  VITAL SIGNS:  Temperature 105, blood pressure  150s/70s, pulse 93, respiratory rate 20.  GENERAL:  When I saw the patient, she was in quite a bit of discomfort,  quite a bit of oral swelling.  HEENT:  Significant swelling in the oropharynx which is obviously  exacerbated for the patient. She is able to tolerate secretions at this  moment.  NECK:  No lymphadenopathy.  CHEST:  Clear to auscultation bilaterally.  CARDIOVASCULAR:   Regular rhythm. No murmurs.  ABDOMEN:  Soft, nontender.  EXTREMITIES:  No edema.   LABORATORY DATA:  White count 3.1, hemoglobin 8.8, hematocrit 25.5,  platelets 454, sed rate 110. Sodium 142, potassium 3.4.   ASSESSMENT:  A 65 year old with recurrent stomatitis.   PLAN:  1. Admit for IV Diflucan.  2. Continue swish and swallow.  3. Consult GI.     ___________________________________________                                         Corrie Mckusick, M.D.   JCG/MEDQ  D:  12/05/2003  T:  12/05/2003  Job:  [id number]

## 2011-01-22 NOTE — Discharge Summary (Signed)
NAME:  [name], [name]                            ACCOUNT NO.:  [id number]   MEDICAL RECORD NO.:  [id number]                   PATIENT TYPE:  INP   LOCATION:  A336                                 FACILITY:  [facility]   PHYSICIAN:  Corrie Mckusick, M.D.               DATE OF BIRTH:  [birth year]   DATE OF ADMISSION:  12/04/2003  DATE OF DISCHARGE:  12/18/2003                                 DISCHARGE SUMMARY   HISTORY OF PRESENT ILLNESS/PAST MEDICAL HISTORY:  Please see admission H&P.   HOSPITAL COURSE:  A 65 year old female with long-standing history of lupus  status post steroid treatment and has recurrent stomatitis.  She had  recently been in [facility] for the recurrent stomatitis and is followed  closely there.  She was readmitted for the same with IV Diflucan and GI  consult.   GI saw the patient the day after admission and suggested cultures from the  oral activity which were already done.  This was done for HSV as well.  Acyclovir 300 IV q.8h. was also started as well as Protonix.  Since that  time, she has mostly been followed by my partners as I was on vacation.   On December 06, 2002, the stomatitis was slightly improving.  It was decided at  that time for steroids.  She was then on IV Diflucan, Acyclovir and  steroids.  She continued to improve slowly, and it was decided to keep her  on the IV therapies to insure slower improvement.  She was also started on  Rocephin on December 07, 2003.  She was to receive 2 units of packed red blood  cells on that day for her chronic anemia.  The patient did refuse blood  transfusion on that day, and Dr. Regino Schultze was consulted.  The following day,  Dr. Regino Schultze talked to the patient and strongly encouraged her to receive the  transfusion.  She did so.  Again, the stomatitis slowly improved on the  triple therapy.  Full liquid diet was added on April 3.  Diet was slowly  advanced on April 5 with continued improvement.  Also, on April 5, GI  changed the  Acyclovir and PPI to p.o.  Rocephin was continued after 2  additional doses.  It was decided then that she needed to complete an  additional 3 weeks of Diflucan and will stay on Acyclovir.  Again, she was  switched to p.o. prednisone as well.  She slowly improved over the next few  days, and unfortunately, on April 9, had some abdominal pain.  It was found  at that time that her amylase and lipase were elevated.  She was kept over  the weekend with pancreatic enzymes added along with the PPI.  It was also  decided that she had to have a pancreatic workup as an outpatient at [facility]  per Dr. Jena Gauss.  Again, her lipase improved on  the day of discharge to 66.  She had no further pain in the mid epigastric area and vital signs were  stable.  She was ready for discharge and will follow up with GI as well as  [facility].   DISCHARGE PHYSICAL EXAMINATION:  VITAL SIGNS:  Temperature 98.2, blood  pressure 150/80, heart rate 80, respirations 20.  GENERAL APPEARANCE:  When I saw her she did look better.  No acute distress.  HEENT:  Oropharynx continues with stomatitis, although, greatly improved.  CHEST:  Clear to auscultation bilaterally.  CARDIOVASCULAR:  Regular rate.  No murmurs.  ABDOMEN:  Soft, nontender.   DISCHARGE MEDICATIONS:  Same as admission with the addition of:  1. Prednisone 30 mg b.i.d.  2. Zovirax 400 mg b.i.d.  3. Diflucan 200 mg daily.  4. Pancreas 3 tablets t.i.d.   FOLLOWUP:  She is to follow up with Dr.  Jena Gauss in one week at [facility] within one week and [place] within 1-2 weeks.     ___________________________________________                                         Corrie Mckusick, M.D.   JCG/MEDQ  D:  12/18/2003  T:  12/18/2003  Job:  [id number]

## 2011-01-22 NOTE — H&P (Signed)
   [name], [name]                            ACCOUNT NO.:  [id number]   MEDICAL RECORD NO.:  [id number]                   PATIENT TYPE:  INP   LOCATION:  A332                                 FACILITY:  [facility]   PHYSICIAN:  Kirk Ruths, M.D.            DATE OF BIRTH:  [birth year]   DATE OF ADMISSION:  02/03/2003  DATE OF DISCHARGE:                                HISTORY & PHYSICAL   CHIEF COMPLAINT:  Nausea and mouth pain.   HISTORY OF PRESENT ILLNESS:  This is a 65 year old black female who was  recently discharged approximately a month ago for similar admission.  At  that time, the patient had mucocutaneous thrush, SLE, hypothyroidism,  abdominal pain, gastritis, as well as anemia of chronic disease.  The  patient was seen today and has oral thrush bacilli with some bullous  lesions, nausea, and conjunctivitis.  The patient also has neutropenia which  seems to be chronic.  She underwent EGD on her last admission and was found  to have gastritis and Helicobacter pylori for which she was treated.  The  patient is scheduled for manometry studies at [facility] in  approximately a month.   CURRENT MEDICATIONS:  1. Levoxyl 100 mg daily.  2. Cardizem CD 120 mg.  3. Protonix.   She had been on a tapering dose of steroids for which she is through.   REVIEW OF SYSTEMS:  Denies chest pain.  Does admit to generalized aching.  Denies fever.   PHYSICAL EXAMINATION:  GENERAL APPEARANCE:  An elderly black female who  appears miserable.  VITAL SIGNS:  Temperature 99.2 degrees, blood pressure 126/60, heart rate  100 and regular, respirations 24 and unlabored, 100% pulse oximetry on room  air.  HEENT:  TMs are normal.  Pupils equal, round, and reactive to light and  accomodation.  There is an exudate from her right eye for conjunctivitis.  There are blisters in her lips, as well as old candidiasis with severe  stomatitis.  NECK:  Supple without JVD, bruit, or thyromegaly.  LUNGS:  Clear in all areas.  HEART:  Regular sinus rhythm without murmur, rub, or gallop.  ABDOMEN:  Soft with mild diffuse tenderness.  Positive bowel sounds.  EXTREMITIES:  Without cyanosis, clubbing, or edema.  NEUROLOGIC:  Grossly intact.  SKIN:  The patient does have a few 1 cm blue line-like lesions, one on her  right shoulder and one on her left ankle, which she had before with her  lupus.                                               Kirk Ruths, M.D.   WMM/MEDQ  D:  02/03/2003  T:  02/03/2003  Job:  [id number]

## 2011-01-22 NOTE — Consult Note (Signed)
NAME:  [name], [name]                            ACCOUNT NO.:  [id number]   MEDICAL RECORD NO.:  [id number]                   PATIENT TYPE:  INP   LOCATION:  A336                                 FACILITY:  [facility]   PHYSICIAN:  Lionel December, M.D.                 DATE OF BIRTH:  [birth year]   DATE OF CONSULTATION:  12/05/2003  DATE OF DISCHARGE:                                   CONSULTATION   REASON FOR CONSULTATION:  Severe stomatitis with oropharyngeal dysphagia.   HISTORY OF PRESENT ILLNESS:  [name] is a 65 year old African American  female who was admitted to Dr. Lamar Blinks service yesterday when she  presented with ulcers in her mouth along with swelling of her tongue, with  difficulty in swallowing liquids and/or solids.  She has a history of oral  candidiasis, oral herpes simplex virus type 1.  She apparently had been  doing well until a few weeks ago when she started to have some soreness.  It  has been gradually getting worse.  For the last one week, she has had  difficulty swallowing her medicines and food.  She has only been able to  swallow a few sips of water.  She has been having fever.  She has not had  any dyspnea or chest pain.  She denies abdominal pain although she has had  this in the past.  There is no history of melena or rectal bleeding.  The  patient says that she was on Valtrex until three weeks ago.  She is  presently being treated for oral candidiasis.  She says she is not feeling  any better.  However, she has not gotten worse since yesterday.   MEDICATIONS:  1. She is on Diflucan 3 mg IV daily.  2. Synthroid 125 micrograms daily.  3. Nystatin 5 cc swish and swallow q. 1 hour.  4. Tylox one q.4 p.r.n.  5. Morphine sulfate 2 to 4 mg q. 2 to 4 p.r.n.   PAST MEDICAL HISTORY:  1. She has a history of lupus.  2. Pemphigus vulgaris and mucocutaneous candidiasis as well as type 1 herpes     simplex viral infection.  This was diagnosed last year when she was at    [facility].  3. She also has chronic gastroesophageal reflux disease.  4. Noninsulin dependent diabetes mellitus.  5. She has previously been treated for H pylori gastritis.  6. History of pancreatitis for which she has been evaluated by [place] in the     past.  Workup has been negative, and she has been referred to [facility],     but has not had EUS and/ or ERCP to date.  She has seen Dr. Opal Sidles over     there.   PRIOR SURGERIES:  1. Cholecystectomy.  2. Hysterectomy.  3. She had lumbar spine surgery years ago.  4. She also had __________removed  at a later date.  5. She also has had bilateral salpingo-oophorectomy years ago.   ALLERGIES:  SULFA AND MYCELEX.   FAMILY HISTORY:  Noncontributory.  Both parents in fair health.  She has 11  siblings without any chronic problems that she is aware of.   SOCIAL HISTORY:  She is single.  She has never married.  She has four  children who are in good health.  She used to work as a Associate Professor.  She is  now disabled.  She lives alone, but one of her sons lives with her, but she  can pretty much manage her own affairs.  She does not drink alcohol.  She  has not smoked a cigarette for 30+ years.   PHYSICAL EXAMINATION:  GENERAL:  A well-developed, mildly obese, African  American female who appears to be acutely ill.  VITAL SIGNS:  Admission  weight recorded to be 35.9 pounds.  She is 5 feet tall.  Pulse 93 per  minute.  Blood pressure 134/71, respirations 20.  Temperature 102.4.  HEENT: Conjunctivae is pink.  Sclerae is nonicteric.  Mouth is open, and she  has drooling of her saliva.  Tongue is swollen and covered with what appears  to be a topical nystatin.  She has aphthous ulcers over her palate.  I am  able to see the base of her uvula.  She does not have any stridor.  NECK:  No neck masses are noted.  CARDIAC:  Exam is a regular rhythm, normal S1, S2.  No murmur or gallop  noted.  LUNGS:  Clear to auscultation.  ABDOMEN:  Bowel sounds are  normal, palpation reveals a soft abdomen without  tenderness, organomegaly or masses.  RECTAL EXAM:  Deferred.  EXTREMITIES:  She does not have clubbing or peripheral edema.   LABORATORY DATA:  On admission, WBC 3.1, H&H 8.8 and 25.5, platelet count  454,000.  Sodium 142, potassium 3.4, chloride 111, cO2 24, glucose 89, BUN  12, creatinine 0.7, calcium 8.5.  Sed rate 110.   ASSESSMENT:  87. A 65 year old [place] American female who presents with stomatitis.  She     has swelling to her tongue with aphthous ulceration to her palate.  She     has known history of type 1 oral herpes simplex infection, pemphigus     vulgaris as well as mucocutaneous candidiasis.  I suspect her stomatitis     is multifactorial.  She is on IV Diflucan which should take care of     candidiasis, but would not help with other conditions.  Her sed rate is     very high, and I am concerned that this could also be a pemphigus     vulgaris.  I did review her condition with Dr. Allena Katz of dermatology     department at [facility].  She agrees that the patient needs to be treated     for herpes simplex virus, and may also need to be treated for pemphigus     unless she shows dramatic improvement over the next day or two.  2. Anemia.  No evidence of gastrointestinal bleed.  Suspect multifactorial.  3. History of pancreatitis.  This is not an issue at the present time.Marland Kitchen   RECOMMENDATIONS:  1. Culture will be obtained from oral cavity for herpes simplex virus.  2. We will start her on acyclovir 300 mg IV q.8h.  3. Protonix 40 mg IV q.24h.  4. She will need to be watched for  any evidence of respiratory distress, in     which case she may need tracheostomy.  5. If the patient does not improve dramatically in the next 24 hours,     steroids as well as referral to tertiary center should be considered.   NOTE:  I would like to thank Dr. Phillips Odor for the opportunity to participate in the care of this nice lady.       ___________________________________________                                            Lionel December, M.D.   NR/MEDQ  D:  12/05/2003  T:  12/05/2003  Job:  [id number]

## 2011-01-22 NOTE — Discharge Summary (Signed)
NAME:  [name], [name]                  ACCOUNT NO.:  [id number]   MEDICAL RECORD NO.:  [id number]          PATIENT TYPE:  INP   LOCATION:  A327                          FACILITY:  [facility]   PHYSICIAN:  Madelin Rear. Sherwood Gambler, MD  DATE OF BIRTH:  1945-12-07   DATE OF ADMISSION:  08/26/2006  DATE OF DISCHARGE:  12/24/2007LH                               DISCHARGE SUMMARY   DISCHARGE DIAGNOSES:  1. 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  2. Hypertension.  3. Third pemphigus vulgaris, in remission.  4. Hypothyroidism.  5. Hypertension.  6. Hyperlipidemia.  7. Peripheral neuropathy.  8. Chronic back pain.   DISCHARGE MEDICATIONS:  1. Synthroid 25 mcg daily.  2. Simvastatin 20 mg daily.  3. Atenolol 12.5 mg p.o. b.i.d.  4. Lasix 20 mg t.i.d.  5. Protonix 40 mg b.i.d.  6. Lyrica 75 mg t.i.d.  7. Reglan 5 mg q.i.d.  8. Dilaudid 2-4 mg q.3-4h p.r.n. pain.  9. Phenergan p.r.n. 25 mg q.i.d. p.o.   SUMMARY:  The patient was admitted with recurrent flare of her  polyserositis, usually at manifest as abdominal pain.  She responded  well to IV Solu-Cortef.  She had no complications with IV hydration and  gentle reintroduction of diet.  She was stable on day of discharge and  back to baseline.      Madelin Rear. Sherwood Gambler, MD  Electronically Signed     LJF/MEDQ  D:  08/29/2006  T:  08/29/2006  Job:  [id number]

## 2011-01-22 NOTE — Discharge Summary (Signed)
[name], [name]                            ACCOUNT NO.:  [id number]   MEDICAL RECORD NO.:  [id number]                   PATIENT TYPE:  INP   LOCATION:  A322                                 FACILITY:  [facility]   PHYSICIAN:  Patrica Duel, M.D.                 DATE OF BIRTH:  [birth year]   DATE OF ADMISSION:  11/11/2003  DATE OF DISCHARGE:                                 DISCHARGE SUMMARY   DISCHARGE DIAGNOSES:  1. 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  2. Documented pemphigus.  3. Hypothyroidism.   For details regarding admission please refer to admitting note.  Briefly,  this is a very unfortunate 65 year old female who has a several-year history  of documented lupus erythematosus as well as pemphigus.  She has had  multiple episodes of severe mucositis/glossitis which has been refractory to  anything but high-dose steroid therapy.  She has been evaluated and treated  at [facility].  The patient was seen in the office the  day prior to admission with a several-day history of increasingly-severe  pain in her oropharynx and throat.  She also had moderate pharyngeal  dysphagia.  She was sent to [facility] where she was not seen because  of a prolonged wait.  She presented back to the emergency department with  the same complaint.  There she was found to have severe mucositis/glossitis.  Laboratory review revealed a white count of 3000, H&H 10/30, normal  chemistries and electrolytes, and a sed rate of 123.  She was febrile with  temperature of 101 degrees.  She was admitted with recurrent severe  glossitis refractory to outpatient therapy.   COURSE IN THE HOSPITAL:  The patient was treated with IV Solu-Medrol, Dukes  mixture and Nystatin, and Diflucan.  She has slowly responded but currently  is much improved.  She has received some pain relief with viscous Xylocaine.  Currently she is stable for  discharge.   DISPOSITION:  Medications include:  1. Prednisone 25 mg b.i.d.  2. Dukes Mixture q.i.d.  3. Diflucan 150 daily.  4. Nystatin suspension.  5. She will continue her Synthroid at 0.125 daily.   Of note, her potassium was 3.4 this morning and she will also receive K-Dur  20 mEq daily for the next several days.   Arrangements will be made to see her at the rheumatology specialty clinic at  [facility] this week.  Will follow and treat expectantly.     ___________________________________________                                         Patrica Duel, M.D.   MC/MEDQ  D:  11/16/2003  T:  11/16/2003  Job:  [id number]

## 2011-01-22 NOTE — Discharge Summary (Signed)
NAME:  [name], [name]                  ACCOUNT NO.:  [id number]   MEDICAL RECORD NO.:  [id number]          PATIENT TYPE:  INP   LOCATION:  A326                          FACILITY:  [facility]   PHYSICIAN:  Madelin Rear. Sherwood Gambler, MD  DATE OF BIRTH:  October 30, 1945   DATE OF ADMISSION:  10/16/2004  DATE OF DISCHARGE:  02/20/2006LH                                 DISCHARGE SUMMARY   DISCHARGE DIAGNOSES:  1.  Mucositis secondary to Candida, possible herpes.  2.  Systemic lupus erythematosus.  3.  ______ vulgaris.  4.  Malnutrition.  5.  Dehydration.  6.  Diabetes mellitus type 2.  7.  Anemia of chronic disease.  8.  Gastrointestinal bleed secondary to hemorrhoids.  9.  Hypothyroidism.  10. Hypertension.   DISCHARGE MEDICATIONS:  1.  Medrol Dosepak.  2.  MS Contin 30 b.i.d.  3.  Valtrex 1 g t.i.d.  4.  Duke's magic mouthwash one teaspoon q.4h. p.r.n.  5.  Diflucan 100 mg b.i.d.  6.  Continuation of home medications including Protonix, Norvasc, and      Synthroid.   SUMMARY:  Patient was admitted with poor oral intake secondary to severe  stomatitis.  This is recurrent.  She was noted to have clinically what  appeared to be a yeast mucositis as well as some herpetic appearing lesions.  She responded to IV Diflucan, IV hydration, and IV steroids.  She eventually  started taking adequate nutrition with follow-up in office.      LJF/MEDQ  D:  11/09/2004  T:  11/09/2004  Job:  [id number]

## 2011-01-22 NOTE — Consult Note (Signed)
[facility]  Patient:    [name], [name] Visit Number: [id number] MRN: [id number]          Service Type: RHE Location: [facility] Attending Physician:  Aundra Dubin Dictated by:   Aundra Dubin, M.D. Proc. Date: 07/27/01 Admit Date:  07/27/2001   CC:         [name], M.D.  [name], M.D.   Consultation Report  CHIEF COMPLAINT:  Lupus dermatitis, sore mouth.  HISTORY OF PRESENT ILLNESS:  [name] returns reporting that she has had a very severe sore mouth for several weeks.  [name] performed an EGD on July 19, 2001 which found multiple aphthous ulcers of the hard palate and buccal mucosa.  Otherwise, the examination was normal and not showing candidiasis, particularly to the esophagus.  She reports she is still having lesions to her arms.  She has been taken off of Plaquenil by [name].  Her prednisone has been altered several times since seeing her and recently she was at the emergency room and this was increased to 40 mg a day.  There are no swollen joints but the right knee continues to bother her.  There is no shortness of breath, nausea.  Her weight is stable, but is up 6 pounds.  MEDICATIONS: 1. Prednisone 20 mg b.i.d. 2. Synthroid 0.1 mg q.d. 3. Recent Z-PAK. 4. Lamisil q.d. 5. Calcium with vitamin D q.d.  PHYSICAL EXAMINATION  VITAL SIGNS:  Weight 164 pounds, blood pressure 132/______, respirations 14.  SKIN:  The darkened hyperpigmented areas to her face are not particularly evident today.  They are much lighter.  She has several scabbed areas with slight erythema to them on the upper chest and shoulder areas.  HEENT:  Mouth has several shallow ulcers and some slight mucus to the sides of her mouth.  LUNGS:  Clear.  HEART:  Regular, no murmur.  MUSCULOSKELETAL:  Hands, wrists, elbows, shoulders, left knee, ankles, and feet have no arthritis and are nontender.  The right knee has forward flexion only to  90 degrees.  ASSESSMENT AND PLAN: 1. Mouth ulcers.  She is not tolerating the Mycelex troches and I have placed    her on Diflucan 100 mg one q.d. for three days.  If this were lupus related    the steroids would have had a more decided effect. 2. Lupus dermatitis.  I am referring her back to [name] to see if he can    place her on medicines that may help.  She has been taken off of the    Plaquenil.  I wonder if she needs a consultation with a dermatologist at    one [facility] the [facility] if [name] is not able to help her.  I will see her back on a p.r.n. basis. Dictated by:   Aundra Dubin, M.D. Attending Physician:  Aundra Dubin DD:  07/27/01 TD:  07/27/01 Job: [id number]

## 2011-01-22 NOTE — Consult Note (Signed)
NAME:  [name], [name]                            ACCOUNT NO.:  [id number]   MEDICAL RECORD NO.:  [id number]                   PATIENT TYPE:  INP   LOCATION:  A320                                 FACILITY:  [facility]   PHYSICIAN:  R. Roetta Sessions, M.D.              DATE OF BIRTH:  03-03-1946   DATE OF CONSULTATION:  02/28/2004  DATE OF DISCHARGE:                                   CONSULTATION   CONSULTING PHYSICIAN:  R. Roetta Sessions, M.D.   REFERRING PHYSICIAN:  Kirk Ruths, M.D.   REASON FOR CONSULTATION:  Abdominal pain, nausea, vomiting and diarrhea.   HISTORY OF PRESENT ILLNESS:  The patient is a 65 year old African-American  female who was admitted through the emergency department for right upper  quadrant pain, diarrhea and vomiting.  The patient had previously been seen  by Dr. Karilyn Cota in consultation on December 05, 2003.  The patient has a history  of lupus and pemphigus as well as a history of recurrent pancreatitis.  The  patient had been referred to Dr. Steffanie Dunn at [facility] for recurrent pancreatitis and she had biliary and possible  pancreatic sphincterotomy with a request from Dr. Steffanie Dunn that the stent be  removed in two weeks.  Dr. Karilyn Cota performed the procedure on Feb 04, 2004 to  remove the stent, but his operative notes indicate that the stent was not in  place at the time of the procedure.  He did make a note that the ampulla of  Vater was identified and that there was a free flow of bile into the  duodenum.  He further noted that if the patient had recurrent evidence of  pancreatitis that she would need to be re-evaluated with an ERCP at [facility].  On admission the patient's  laboratories included an amylase which was elevated at 236 and the patient's  lipase was 32.  The patient's liver function tests were within normal limits  at that time.  The patient is complaining of nausea and vomiting  as well as  diarrhea stating that she is passing multiple soft stools daily, but denies  the presence of hematochezia or melena.  She does report that she has  significant discomfort with inflamed hemorrhoids.  Regarding her vomiting  the patient denies hematemesis or swallowing difficulties at this time.   CURRENT MEDICATIONS:  1. Prednisone 5 mg daily.  2. Protonix 40 mg b.i.d.  3. Phenergan p.r.n. for nausea.  4. Synthroid 0.1 mcg daily.  5. Pentagastrin.  6. The patient has been placed on Carafate, Dilaudid 2 mg IV q.2h p.r.n. for     pain, Ambien 10 mg p.o. q.h.s. as needed.   PAST MEDICAL HISTORY:  1. History of lupus.  2. Pemphigus vulgaris and ileocutaneous candidiasis as well as type 2 herpes     simplex viral infection.  3.  Gastroesophageal reflux disease.  4. Non-insulin dependent diabetes.  5. Past history of treatment for Helicobacter pylori gastritis.  6. As noted above, pancreatitis.   PAST SURGICAL HISTORY:  1. Cholecystectomy.  2. Hysterectomy.  3. Lumbar spine surgery several years ago.  4. Bilateral salpingo-oophorectomy many years ago.   ALLERGIES:  1. SULFA.  2. MYCELEX.   FAMILY HISTORY:  Noncontributory.  Both parents are in fair health.  The  patient has eleven siblings without any chronic problems that she is aware  of.   SOCIAL HISTORY:  The patient is single.  She has never married.  She has  four children who are in good health. 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 She lives with one of her  sons.  She does not drink alcohol and has not smoked cigarettes for more  than 30 years.   PHYSICAL EXAMINATION:  GENERAL:  The patient is a well developed, overweight  African-American female who is lying in bed complaining of discomfort.  VITAL SIGNS:  Temperature 98.4, pulse 83, respirations 18, blood pressure  146/74, height 60 inches, weight 133 pounds.  HEENT:  Conjunctivae are pink.  The sclerae  are pink and non-icteric.  The  oropharynx is pink without lesions.  Neck:  No masses or thyromegaly were  noted.  CARDIAC:  Regular rate and rhythm without murmurs, rubs or gallops.  LUNGS:  Clear to auscultation bilaterally.  EXTREMITIES:  There is no cyanosis, clubbing or edema.  ABDOMEN:  Bowel sounds are present in all four quadrants.  The abdomen is  soft.  There are no masses or hepatosplenomegaly.  There is mild tenderness  to palpation over the right upper quadrant.   LABORATORY DATA:  Admission laboratories:  WBC 2.9, hemoglobin 9.1,  hematocrit 25.7, platelets 324,000.  Sodium 139, potassium 3.3, chloride  109, C02 23, glucose 91, BUN 14, creatinine 0.8.  Total bilirubin 0.4,  direct bilirubin less than 1.0, alkaline phosphatase 59, SGOT 16, SGPT 13,  total protein 6.5, albumin 3.4, calcium 9.1, amylase 236, lipase 32.  Results are pending on stool O&P, stool culture and sensitivity and  Clostridium difficile.  A blood culture is also pending.   ASSESSMENT:  The patient is a 65 year old African-American female with a  history of lupus and pemphigus.  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  The patient  had placement of a stent with biliary and possibly pancreatic sphincterotomy  by Dr. Steffanie Dunn at [facility].  A follow-up  with Dr. Karilyn Cota showed the stent to no longer be in place and it was  recommended by Dr. Karilyn Cota that the patient be re-evaluated at [facility] if she should have recurrent pancreatitis  which appears to be the case.  The patient's diarrhea and vomiting may be  gastroenteritis and stool studies have been ordered.  The patient's inflamed  hemorrhoids are more than likely a result of her present diarrhea.   RECOMMENDATIONS:  1. Proctocort Foam q.i.d. for relief of hemorrhoid symptoms. 2. Discuss referral to [facility] with     Dr. Jena Gauss for recurrent symptoms of     pancreatitis.  3. Maintain the patient on a clear liquid diet as tolerated.  More     recommendations to follow.   Thank you for this opportunity to share in  this patient's medical care.     ________________________________________  ___________________________________________  Ashok Pall, PA                           Jonathon Bellows, M.D.   GC/MEDQ  D:  02/28/2004  T:  02/28/2004  Job:  [id number]

## 2011-01-22 NOTE — H&P (Signed)
NAME:  Alejo, [name]                  ACCOUNT NO.:  [id number]   MEDICAL RECORD NO.:  [id number]          PATIENT TYPE:  INP   LOCATION:  A339                          FACILITY:  [facility]   PHYSICIAN:  Madelin Rear. Sherwood Gambler, MD  DATE OF BIRTH:  April 28, 1946   DATE OF ADMISSION:  03/02/2005  DATE OF DISCHARGE:  LH                                HISTORY & PHYSICAL   CHIEF COMPLAINT:  Abdominal pain.   HISTORY OF PRESENT ILLNESS:  The patient had progressively increasing  abdominal pain, which is quite typical for flares of her systemic lupus  erythematosus.  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  She has  significant past history of depression, relatively unresponsive to SSRIs.   SOCIAL HISTORY:  Nonsmoker, nondrinker.  Lives at home in a chronic  debilitated state.   FAMILY HISTORY:  Noncontributory.   REVIEW OF SYSTEMS:  Under HPI.  In addition, she admits to some nausea, poor  oral intake, vomiting and abdominal pain.  No hematemesis, hematochezia or  melena.  No fever or chills, no cough or sputum.  No shortness of breath,  palpitations or chest pain.   PHYSICAL EXAMINATION:  SKIN:  Hemosiderin deposits secondary to recurrent  pemphigus vulgaris flares, but no bullae are present.  Her turgor is normal.  HEAD AND NECK:  No JVD or adenopathy.  Mucous membranes are slightly dry.  CHEST:  Clear.  CARDIAC:  Regular rhythm, no gallop or rub.  ABDOMEN:  Positive diffuse abdominal tenderness but no guarding, rebound,  organomegaly or masses.  EXTREMITIES:  Without clubbing, cyanosis, or edema.  NEUROLOGIC:  Nonfocal.   LABORATORY DATA:  Acute abdomen series is pending at the present time and  will be reviewed when available.  CBC showed a modest anemia of chronic  disease at 10.3 g%, hemoglobin with normochromic, normocytic indices,  platelet count is normal.  There is a slight lift shift with 81 polys and a  5.6 white count, 14% lymphocytes.  Her electrolytes revealed mild  hyperglycemia but otherwise unremarkable.  Her lipase was normal and amylase  was not done.   IMPRESSION:  1.  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  Her sedimentation rate,      incidentally, was noted to be greater than 80.  She will be admitted for      IV hydration, high-dose IV steroids and close monitoring for any      opportunistic      infections, etc.  2.  Diabetes mellitus type 2.  Monitor sugar, sliding scale as needed.  3.  Pemphigus, quiescent at present.       LJF/MEDQ  D:  03/03/2005  T:  03/03/2005  Job:  [id number]

## 2011-01-22 NOTE — Group Therapy Note (Signed)
NAME:  [name], [name]                            ACCOUNT NO.:  [id number]   MEDICAL RECORD NO.:  [id number]                   PATIENT TYPE:  INP   LOCATION:  A320                                 FACILITY:  [facility]   PHYSICIAN:  Kofi A. Gerilyn Pilgrim, M.D.              DATE OF BIRTH:  1946/01/10   DATE OF PROCEDURE:  03/12/2004  DATE OF DISCHARGE:                                   PROGRESS NOTE   SUBJECTIVE:  The patient reports some improvement in her global weakness.  She still has some problems swallowing and in fact complains of pain on  swallowing/odynophagia.  She apparently ambulated in the halls with some  assistance today.   OBJECTIVE:  Workup so far, blood testing:  ESR 32, calcium 10.1.  Alimentary  0.6.  CPK 33, homocysteine level 25.  Vitamin B12 level 344.  TSH level  0.04.  RPR nonreactive.   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  There was also marked disk herniation and spur formation on the right at C5-  C6 which is also causing moderate canal stenosis.  There is no recompression  or cord abnormalities seen.   IMPRESSION AND PLAN:  1. Global weakness, etiology unclear, possibly neuromuscular problems.  Will     follow blood tests that have been ordered, particularly the     cholinesterase inhibitor.  2. Elevated homocysteine level.  Will suggest folic acid for this.  3. Continue physical therapy.  4. She will again need to have an EMG done in outpatient setting, especially     if weakness persists.      ___________________________________________                                            Perlie Gold Gerilyn Pilgrim, M.D.   KAD/MEDQ  D:  03/12/2004  T:  03/12/2004  Job:  [id number]

## 2011-01-22 NOTE — Discharge Summary (Signed)
[name], [name]                            ACCOUNT NO.:  [id number]   MEDICAL RECORD NO.:  [id number]                   PATIENT TYPE:  INP   LOCATION:  A320                                 FACILITY:  [facility]   PHYSICIAN:  Kirk Ruths, M.D.            DATE OF BIRTH:  [birth year]   DATE OF ADMISSION:  02/27/2004  DATE OF DISCHARGE:  03/14/2004                                 DISCHARGE SUMMARY   DISCHARGE DIAGNOSES:  1. Abdominal pain.  2. Diarrhea associated with hemorrhoids and Clostridium difficile infection.  3. Hypertension.  4. Renal insufficiency.  5. Hypokalemia.  6. Anemia.  7. Upper extremity weakness.  8. Dysphagia.  9. Right scapular pain.  10.      Lupus/pemphigus.  11.      Hypothyroidism.  12.      Chronic gastroesophageal reflux disease.  13.      Status post cholecystectomy.   HOSPITAL COURSE:  This unfortunate 65 year old female with multiple problems  relating to chronic lupus and sphincter of Oddi dysfunction is readmitted  after feeling poorly for the last 2 days with some pain in the right upper  quadrant with some vomiting and diarrhea.  The patient was noted on  admission with a white count of 2900, hemoglobin 9.5, her electrolytes were  normal except for a potassium of 3.3 and creatinine of 0.8.  The patient's  amylase was elevated at 236 but eventually returned to normal during her  stay.  The patient's cardiac enzymes were negative.  She was admitted to the  floor, seen in consultation by GI, CT of the abdomen and pelvis were  unremarkable.  The patient continued complaining of abdominal pain and  progressive right scapular pain.  She underwent a CT of the chest which  showed possible atelectasis, right lower lobe, possibly causing her pain.  Also, MRI of the thoracic and C-spines showed some bulging of her disks and  some mild to moderate stenosis.  The patient underwent colonoscopy for  diarrhea and hematochezia, and was found to just have  hemorrhoids and was  positive for C. difficile.  She was treated with Flagyl with resolution of  her diarrhea.  The abdominal pain gradually subsided, she continued to have  the chest pain that was refractory to a Lidoderm patch.  She also received a  Duragesic patch and IV Dilaudid.  During this time, the patient's blood  pressure continued to rise, she underwent scanning for renal artery stenosis  which was negative and was seen by Dr. Kristian Covey for nephrology as well as  Dr. Rito Ehrlich for urology.  It was noted her creatinine jumped to 2.5 and it  was felt that this was a prerenal azotemia situation.  Her ACE inhibitor was  discontinued as well as her diuretics and she had return to normal of her  BUN and creatinine.  The patient's blood pressure gradually improved with a  TTS patch as well as labetalol for tachycardia along with her Norvasc.   The patient had some weakness in her upper extremities, dropping her bed  pan.  She underwent a CT scan of the brain which showed no acute changes and  was seen in consultation by Dr. Gerilyn Pilgrim for neurology and basically had a  normal workup there, including B12 and homocystine, her sed rates were  slightly elevated at 32 and 25.  Incidentally, workup of her blood pressure  included an aldosterone level slightly low at 2.8, seen at __________ were  normal.  Again, HVA and the VMA were ________.  The patient's other stool  cultures for O&P, C&S were negative.  RPR was nonreactive.  Thyroid  stimulating hormone was normal.   The patient also underwent an MRA of the abdomen which was negative.  Her  blood pressure came under fairly good control.  She was tolerating a regular  diet after having some dysphagia which resolved.  Swallowing study was  negative.  She finally is going to be discharged in stable condition,  continuing her previous medications as well as Norvasc 10 mg.  The patient  was arranged to see a neurosurgeon as an outpatient but  incidentally the  symptoms resolved and she did not see him.     ___________________________________________                                         Kirk Ruths, M.D.   WMM/MEDQ  D:  04/01/2004  T:  04/01/2004  Job:  [id number]

## 2011-01-22 NOTE — Procedures (Signed)
[name], [name]                            ACCOUNT NO.:  [id number]   MEDICAL RECORD NO.:  [id number]                   PATIENT TYPE:  INP   LOCATION:  A201                                 FACILITY:  [facility]   PHYSICIAN:  Darlin Priestly, M.D.             DATE OF BIRTH:  September 30, 1945   DATE OF PROCEDURE:  03/19/2004  DATE OF DISCHARGE:                                  ECHOCARDIOGRAM   INDICATION:  Ms. Reyburn is a  65 year old female patient of Dr. Regino Schultze with  a history of chest pain with questionable pulmonary embolism.  She is now  referred for 2-D echocardiogram to evaluate LV function and valvular  structures.   The aorta is within normal limits at 3.1 cm.   Left atrium is within normal limits at 3.2 cm.  No clots seen.  Patient is  in sinus rhythm during the procedure.   IVS and LVPW reveals a disproportionate upper septal thickening up to 1.6  mm.  The posterior wall was normal.   The aortic valve appears to be structurally normal.   The mitral valve leaflets appear mildly thickened with trivial mitral  regurgitation.   Structurally normal tricuspid valve with trivial tricuspid regurgitation.   There is trivial pulmonic regurgitation.   Left ventricular internal dimensions are within normal limits at 3.5 and 2.2  cm, respectively.  There is good overall left ventricular function,  estimated EF of 60%, with no segmental wall motion abnormalities visualized.   Normal RV size and systolic function.   CONCLUSIONS:  1. Disproportionate upper septal thickening is noted with no evidence of     left ventricular outflow tract obstruction and normal ejection fraction     at 60%.  2. Structurally normal aortic valve.  3. Minimally thickened mitral valve leaflets with trivial mitral     regurgitation.  4. Structurally normal tricuspid valve with trivial tricuspid regurgitation.  5. Trivial pulmonic regurgitation.  6. Normal right ventricular size and systolic  function.      ___________________________________________                                            Darlin Priestly, M.D.   RHM/MEDQ  D:  03/19/2004  T:  03/19/2004  Job:  [id number]   cc:   Kirk Ruths, M.D.  [address]  [place]  [place] [address]  Fax: [phone]

## 2011-01-22 NOTE — Discharge Summary (Signed)
NAME:  [name], Sandra                  ACCOUNT NO.:  [id number]   MEDICAL RECORD NO.:  [id number]          PATIENT TYPE:  INP   LOCATION:  A336                          FACILITY:  [facility]   PHYSICIAN:  Madelin Rear. Sherwood Gambler, MD  DATE OF BIRTH:  October 08, 1945   DATE OF ADMISSION:  06/10/2004  DATE OF DISCHARGE:  10/10/2005LH                                 DISCHARGE SUMMARY   DISCHARGE DIAGNOSES:  1.  Acute superimposed on chronic pancreatitis.  2.  _______________ vulgaris.  3.  Systemic lupus erythematosus.  4.  Hypertension.  5.  Noninsulin-dependent diabetes type 2.  6.  Long-term steroid dependency.   DISCHARGE MEDICATIONS:  Not applicable. The patient was transferred after  consultation with GI for definitive care at [facility] under the  service of Dr. Alois Cliche.   SUMMARY:  The patient was admitted with recurrent severe abdominal pain and  vomiting. She has a past medical history well documented in the previous  records as well as the history and physical on admission. She was felt to  have probable biliary dysfunction and was sent for definitive studies at  [facility] in stable hemodynamic condition on the day of discharge.     Lawr   LJF/MEDQ  D:  07/30/2004  T:  07/30/2004  Job:  [id number]

## 2011-01-22 NOTE — Consult Note (Signed)
NAME:  [name], [name]                            ACCOUNT NO.:  [id number]   MEDICAL RECORD NO.:  [id number]                   PATIENT TYPE:  INP   LOCATION:  A320                                 FACILITY:  [facility]   PHYSICIAN:  Jorja Loa, M.D.             DATE OF BIRTH:  1946-06-19   DATE OF CONSULTATION:  03/06/2004  DATE OF DISCHARGE:                                   CONSULTATION   NEPHROLOGY CONSULTATION:   REASON FOR CONSULTATION:  Uncontrolled hypertension.   HISTORY OF PRESENT ILLNESS:  [name] is a 65 year old female with past  medical history including a history of lupus, pancreatitis and history of  also pemphigus vulgaris presently came with complaints of nausea, vomiting,  some diarrhea, and back pain.  During her admission her blood pressure was  found to be somewhat elevated hence at this moment consult is called.  Ms.  [name] states that she has a history of diabetes and mild hypertension and  she denies any previous history of high blood pressure.  Patient has been  admitted to [facility] and also to [facility] with recurrent  abdominal pain, back pain, headache, and she had multiple workups including  splenectomy with stent placement status post stent removal.  At this moment  still she complains of some nausea, complains of pain and also headache and  she stated the pain is not easing and she has been to different physicians  and nobody is doing anything to help her.   PAST MEDICAL HISTORY:  As stated above, patient with history of lupus for  more than 3-4 years, history of pancreatitis, history of pemphigus  infection, history of pancreatic splenectomy with stent placement and status  post removal of the stent, history of diabetes, history of ileocutaneous  Candida infection, history of herpes simplex II infection, history of  pemphigus vulgaris infection and history of hypothyroidism.   SURGICAL HISTORY:  She is status post cholecystectomy,  hysterectomy,  longtime history of surgery to her lumbar area, status post bilateral  salpingo-oophorectomy.   ALLERGIES:  She is allergic to SULFA and also MYCELEX.   MEDICATIONS:  Her medications at this moment consist of Catapres patch 0.3,  she is also on Flexeril 10 mg p.o. t.i.d., Anusol suppository and Dilaudid 4  mg p.o. t.i.d., labetalol is started today 200 mg p.o. b.i.d., levothyroxine  100 mcg p.o. once daily, lidocaine on p.r.n. basis, Prinivil 20 mg p.o. once  daily, metronidazole 500 mg p.o. once daily, Zofran 4 mg p.o. once daily,  Protonix 40 mg p.o. once daily, K-Dur 20 mEq p.o. b.i.d., prednisone 10 mg  p.o. t.i.d., Carafate 1 g p.o. at bedtime, valacyclovir 500 mg p.o. b.i.d.,  she is getting also some Tylenol on p.r.n. basis, Xanax 0.25 mg p.o. q.6h.,  loperamide 2 mg p.o. once daily, Phenergan on p.r.n. basis, Ambien 10 mg  p.o. once daily, she is getting  also Lasix 40 mg IV once daily.   SOCIAL HISTORY:  She used to work as a Copy.  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  No history of smoking.  No history of alcohol abuse.   FAMILY HISTORY:  There is no history of renal insufficiency or hypertension.   REVIEW OF SYSTEMS:  Complains of some headache pain on her right side,  abdominal pain, nausea, some vomiting, some diarrhea, feeling weak and  tired, could not sleep, denies any chest pain, no shortness of breath, no  dizziness, no lightheadedness.   PHYSICAL EXAMINATION:  VITAL SIGNS:  Blood pressure is today 212/100, prior  to that was 90/95, temperature 97.6, pulse of 58, respiratory rate is 16.  HEENT:  Looks emaciated.  No conjunctival pallor.  Nonicteric.  Oral mucosa  seems to be somewhat dry.  NECK:  Supple.  No JVD.  CHEST:  Decreased breath sounds otherwise seems to be clear.  HEART:  Regular rate and rhythm.  No murmur.  ABDOMEN:  Soft.  Positive bowel sounds.  EXTREMITIES:  No edema.   LABORATORIES:  Her white blood cell count is 5.4, hemoglobin 12.2,   hematocrit 34.5, platelets of 269.  Her sodium 141, potassium 3.2, BUN 10,  creatinine 0.9.  When she came her potassium was 2.6.  Her albumin 3.1 and  her amylase was 143, lipase 27.  UA:  Specific gravity is 1.02, pH of 5, no  protein and no nitrites.   ASSESSMENT:  Problem 1. HYPERTENSION.  At this moment etiology not clear,  possibly idiopathic since patient has a previous history of hypertension, at  this moment there is no history of diabetic nephropathy and also she does  not have any proteinuria.  Her blood pressure at this moment seems to be  very high, possibly because patient is restless and anxious and that might  also contribute to her high blood pressure, she has been on two medications,  Toprol was changed to labetalol (she will receive her first dose).   Problem 2. HISTORY OF LUPUS, LONGSTANDING.  She is on steroid possibly for  her dermatitis, not sure whether she has any other involvement.   Problem 3. PANCREATITIS, LONGSTANDING PROBLEM.  She has multiple workups;  slight increase in amylase/lipase normal; patient being followed by  gastroenterology.   Problem 4. HYPOKALEMIA.  Probably may be secondary to diuretics.  If  however, the diuretic is discontinued and if the potassium seems to be low  in presence of hyperparathyroidism, hyperaldosteronism may need to be  considered but at this moment I really doubt.   Problem 5. HISTORY OF DIABETES.  She is not on any medication, possibly  related to her steroids.   Problem 6. HISTORY OF PEMPHIGUS VULGARIS INFECTION.   RECOMMENDATIONS:  I agree with labetalol and I probably will increase her  dose to 200 mg p.o. b.i.d., continue with Catapres, possibly still treating  her pain and making her comfortable also may help with her  blood pressure.  I will continue this present management.  If patient continues to have high blood pressure I probably will do a workup for secondary causes such as  hyperaldosteronism.  We will  stop the diuretics.  We will continue the other  medications as before.  We will follow her blood work.      ___________________________________________  Jorja Loa, M.D.   BB/MEDQ  D:  03/06/2004  T:  03/06/2004  Job:  [id number]

## 2011-01-22 NOTE — Discharge Summary (Signed)
[name], [name]                            ACCOUNT NO.:  [id number]   MEDICAL RECORD NO.:  [id number]                   PATIENT TYPE:  INP   LOCATION:  A332                                 FACILITY:  [facility]   PHYSICIAN:  Kirk Ruths, M.D.            DATE OF BIRTH:  [birth year]   DATE OF ADMISSION:  02/03/2003  DATE OF DISCHARGE:  02/09/2003                                 DISCHARGE SUMMARY   DISCHARGE DIAGNOSES:  1. Severe stomatitis.  2. Systemic lupus erythematous.  3. Leukopenia and anemia secondary to systemic lupus erythematous.  4. Conjunctivitis, acute.  5. Esophageal reflux.  6. History of hypothyroidism.  7. Vaginitis.   HOSPITAL COURSE:  This 65 year old white female was admitted by me to the  emergency room for severe mouth pain along with abdominal pain and  conjunctivitis.  The patient had nausea, was unable to eat due to severe  stomatitis.  Mouth appeared to have severe thrush.   She was admitted to the floor, begun on IV steroids as well as IV fluids.  The patient was also put on Nystatin suspension and Maxitrol ophthalmic  solution for severe conjunctivitis.  The patient had a previous admission  for similar problem felt to be related to autoimmune disease SLE.  She,  during her stay, had some mildly elevated blood sugars in the 125-150 range,  felt to be related to the steroids.  The patient also was put on Carafate  slurry.   She was seen in consultation with GI who felt this possibly could be  bacterial but felt it was also related to her immune disease.  Levaquin was  added to her regimen.  ENT was consulted concerning the unusual severe  nature of this stomatitis and basically regular treatment, recommend  possible outpatient biopsy by Dermatology.  The patient also complained of  some vaginal discomfort with discharge.  She was already on Diflucan.  Mytrex was added to her regimen and she was seen by Dr. Despina Hidden for Gynecology.  Temovate cream to  be applied locally.  The patient had significant relief of  the vaginitis.  Also her mouth was improving.  She was tolerating diet to  some extent.  The patient also had a CT scan while in the ER that showed  negative sinusitis.   LABORATORY DATA:  As mentioned, white count was 2,400 on admission with  hematocrit 27.7.  On discharge, white count was 5,700 with hematocrit 28.2.  Sedimentation rate 95.  Initially, potassium was 3.1 but that was corrected  by the following day.  It was 4.2 at the time of discharge.  She also had  some mildly elevated amylase which was unexplainable.  Amylase being 192,  followup was 139 which is near normal.  Blood cultures were negative.  Streptococcal screen was negative.   The patient had reached maximum hospital benefit.  She was discharged home  on  Tylox, Carafate, Diflucan, Mycostatin and prednisone dose pack.  She is  to follow at [facility] in one week.  Also is scheduled for manometer studies  at [facility] in one week.                                               Kirk Ruths, M.D.    WMM/MEDQ  D:  02/28/2003  T:  02/28/2003  Job:  [id number]

## 2011-01-22 NOTE — Consult Note (Signed)
NAME:  [name], [name]                  ACCOUNT NO.:  [id number]   MEDICAL RECORD NO.:  [id number]          PATIENT TYPE:  INP   LOCATION:  A336                          FACILITY:  [facility]   PHYSICIAN:  R. Roetta Sessions, M.D. DATE OF BIRTH:  07/25/1946   DATE OF CONSULTATION:  06/10/2004  DATE OF DISCHARGE:                                   CONSULTATION   REQUESTING PHYSICIAN:  Madelin Rear. Sherwood Gambler, M.D.   REASON FOR CONSULTATION:  Abdominal pain, nausea, vomiting.   HISTORY OF PRESENT ILLNESS:  Patient is a 65 year old black female who was  admitted with recurrent abdominal pain, nausea and vomiting.  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  Has  underwent a biliary sphincterotomy and pancreatic duct stent earlier this  year.  She was hospitalized at the end of June and beginning of July for a  couple of weeks.  She has had extensive evaluation for abdominal pain and  various GI symptoms at that time.  Workup included an unremarkable MRA.  Negative diagnostic swallowing function test.  A CT of the abdomen and  pelvis.  Ultrasound in April, 2005 revealed status post cholecystectomy and  common bile duct diameter of 7-8 mm.  She had a small bowel follow-through  in April, 2004, which was negative as well.   She states that she had done fairly well since she was discharged from the  hospital up until September 26th.  She began having nausea, vomiting, severe  headache, abdominal pain, and coming to the emergency department.  She was  sent home at that time and asked to follow up with her PCP.  She went home  but states that she continued to have severe epigastric/right upper quadrant  abdominal pain which was intermittent in nature but with some constant and  dull pain.  She has been having intermittent nausea and vomiting as well.  She states that she feels like she has a boil or abscess in her right upper  quadrant, which is under pressure and needs to  be lanced.  She has  constipation.  She generally has a bowel movement every three days.  She was  treated with C. diff colitis during her last hospitalization with good  results.  She denies any melena or rectal bleeding.  She has had some emesis  which has been gray but no black or red blood seen.  She has intermittent  pain in her mid chest, which she feels is related to indigestion.   On admission, she had acute abdominal film which revealed mild bronchiectic  changes.   White count was 2.8, hemoglobin 8.4, hematocrit 24.8, platelets 324,000.  MCV 89.5.  Total bilirubin 0.6, alkaline phosphatase 71, SGOT 16, SGPT 11,  albumin 3.4, amylase 261, lipase 34.  Notably, her hemoglobin was 13.2 on  March 17, 2004.   MEDICATIONS PRIOR TO ADMISSION:  1.  Protonix 40 mg daily.  2.  Synthroid 0.125 mg 1/2 tablet daily.  3.  Norvasc daily.  4.  Phenergan p.r.n.  5.  Xanax q.6h. p.r.n.  6.  Prednisone 10 mg q.d.  Currently on a taper.  7.  Levsin p.r.n.   ALLERGIES:  1.  SULFA.  2.  MYCELEX.   PAST MEDICAL HISTORY:  1.  History of lupus.  2.  Pemphigus vulgaris and mucocutaneous candidiasis as well as Type II      herpes simplex virus infection.  3.  Gastroesophageal reflux disease.  4.  Non-insulin-dependent diabetes mellitus.  5.  Past history of H. pylori gastritis.  Underwent treatment.  6.  History of pancreatitis with sphincter of Oddi dysfunction, status post      ERCP with pancreatic stent and biliary sphincterotomy on Jan 15, 2004.      A few weeks later, Dr. Karilyn Cota performed an EGD with ERCP for stent      removal; however, it was found that stents had already passed.   PAST SURGICAL HISTORY:  1.  Cholecystectomy.  2.  Hysterectomy.  3.  Lumbar spine surgery several years ago.  4.  Bilateral salpingo-oophorectomy many years ago.   FAMILY HISTORY:  Noncontributory.   SOCIAL HISTORY:  She is single and has never been married.  She has four  children who are in good  health.  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  She lives with one of her  sons.  She does not drink alcohol and has not smoked cigarettes for more  than 30 years.   REVIEW OF SYSTEMS:  Please see HPI for GI.  CARDIOPULMONARY:  Denies any  shortness of breath.  GENERAL:  Complains of fatigue.   PHYSICAL EXAMINATION:  VITAL SIGNS:  Temp 99.3, pulse 106, respirations 20,  blood pressure 125/82.  GENERAL:  A pleasant, well-developed and well-nourished black female in no  acute distress.  SKIN:  Warm and dry.  No jaundice.  HEENT:  Conjunctivae pink.  Sclerae are anicteric.  Oropharynx:  Mucosa  moist and pink.  No lesions or erythema or exudate.  NECK:  No lymphadenopathy or thyromegaly.  LUNGS:  Clear to auscultation.  CARDIAC:  Regular rate and rhythm.  Normal S1 and S2.  No murmurs, rubs or  gallops.  ABDOMEN:  Positive bowel sounds.  Soft, nondistended.  She has moderate  tenderness in the epigastric and right upper quadrant region to deep  palpation.  No organomegaly or masses.  Intentional guarding but no rebound  tenderness.  EXTREMITIES:  No edema.   LABS:  As mentioned in HPI.  In addition, sodium 137, potassium 3.5, BUN 9,  creatinine 1.1, glucose 89, MCV 89.5, platelets 324,000.   IMPRESSION:  Patient is a 65 year old lady with recurrent right upper  quadrant/epigastric pain with nausea and vomiting.  Extensive evaluation, as  outlined above.  Questionable sphincter of Oddi dysfunction as etiology of  symptoms, although symptoms not entirely classical.  LFTs remain normal.  Amylase is elevated with normal lipase.  This is a somewhat nonspecific  finding.  I do not suspect any pancreatitis at this time.  She also has  normocytic anemia with leukopenia.  H&H was normal on March 17, 2004 but  previously has required transfusions.  Recent colonoscopy unremarkable as well as EGD in May.   PLAN:  1.  LFTs, CBC, amylase,  lipase.  2.  Follow up with anemia studies.  3.  Follow up Hemoccult.  If any are positive, may consider giving capsule      studies as next step.  4.  Levsin sublingual  q.a.c. and q.h.s.  5.  Continue IV Protonix.  6.  Lidoderm patch.  Apply 12 hours each day to the right upper quadrant.   I would like to thank Dr. Sherwood Gambler for allowing [place] to partner together with  this patient.     Lesl   LL/MEDQ  D:  06/10/2004  T:  06/10/2004  Job:  [id number]   cc:   Madelin Rear. Sherwood Gambler, M.D.  [address]  [place]  [place] [address]  Fax: [phone]

## 2011-01-22 NOTE — H&P (Signed)
NAME:  [name], [name]                  ACCOUNT NO.:  [id number]   MEDICAL RECORD NO.:  [id number]           PATIENT TYPE:   LOCATION:  A336                          FACILITY:  [facility]   PHYSICIAN:  Madelin Rear. Fusco, M.D.DATE OF BIRTH:  10-01-1945   DATE OF ADMISSION:  DATE OF DISCHARGE:  LH                                HISTORY & PHYSICAL   CHIEF COMPLAINT:  Abdominal pain and vomiting.   HISTORY OF PRESENT ILLNESS:  The patient since last evening developed  abdominal pain and vomiting described as epigastric and right upper quadrant  abdominal pain without radiation.  She states she had something that looked  possibly coffee grounds in nature.  She has had no fever, rigors, or chills.  She feels severe malaise today but denies any polymyalgia, fever, rigors,  chills.  She specifically denied any melena or hematochezia.   PAST MEDICAL HISTORY:  1.  Biliary dysfunction, status post sphincterotomy and stent placement with      spontaneous ejection of stent.  This was accomplished in May of 2005.      Dr. Steffanie Dunn of [facility] performed this procedure in consultation      and referral from Drs. Rehman and Rourk.  2.  Diabetes mellitus.  3.  Hypertension.  4.  Systemic lupus erythematosus.  5.  Pemphigus vulgaris with severe dermatitis.   SOCIAL HISTORY:  Nonsmoker, nondrinker.  No alcohol use.   FAMILY HISTORY:  Noncontributory.   REVIEW OF SYSTEMS:  As above.  All else is negative.   PHYSICAL EXAMINATION:  SKIN:  Some hyperpigmentation from previous episodes  of pemphigus but no petechiae or purpura.  No active dermatitis.  HEAD AND NECK:  No JVD or adenopathy.  Specifically, no oral Candidiasis was  noted.  CHEST:  Clear.  CARDIAC:  Regular rate and rhythm without murmurs, rubs, or gallops.  ABDOMEN:  Positive right upper quadrant abdominal tenderness without  guarding, epigastric abdominal tenderness without guarding.  No organomegaly  or masses.  EXTREMITIES:  Without  clubbing, cyanosis, or edema.  NEUROLOGIC:  Nonfocal.   IMPRESSION AND PLAN:  1.  Abdominal pain, recurrent.  Probable biliary dysfunction.  Rule out      peptic ulcer disease, questionable upper GI bleed.  Will assess full      laboratories, Hemoccult stools.  Obstruction series with anticipated GI      consultation as well.  Continue her proton pump inhibitor parenterally.  2.  Steroid dependent and severe lupus.  Continue stress dose steroids in      house.  Rule out Addisonian crisis.  3.  Lupus, as above.  4.  Hypertension.  Manage as needed with expectant observation.  5.  Diabetes mellitus.  Monitor sugars.  Sliding scale insulin, especially      with increased steroid doses parenterally.       ___________________________________________  Madelin Rear. Sherwood Gambler, M.D.    LJF/MEDQ  D:  06/10/2004  T:  06/10/2004  Job:  [id number]

## 2011-01-22 NOTE — H&P (Signed)
NAMELEOMA, FOLDS                            ACCOUNT NO.:  [id number]   MEDICAL RECORD NO.:  [id number]                   PATIENT TYPE:  INP   LOCATION:  A320                                 FACILITY:  [facility]   PHYSICIAN:  Kirk Ruths, M.D.            DATE OF BIRTH:  1946/02/27   DATE OF ADMISSION:  02/27/2004  DATE OF DISCHARGE:                                HISTORY & PHYSICAL   CHIEF COMPLAINT:  Abdominal pain with vomiting.   HISTORY OF PRESENT ILLNESS:  A 65 year old black female who has ongoing  significant problems with lupus and pemphigus.  The patient on this  admission states that she started feeling poorly approximately two days ago.  Last night, she was having some epigastric right upper quadrant pain  associated with some diarrhea and vomiting.  She was seen in the emergency  room, where she was had fairly normal vitals.  She was afebrile with a white  count of 2900, hemoglobin 9.5.  D-dimer was 0.58.  Cardiac enzymes negative.  Electrolytes were normal except for a potassium slightly low at 3.3.  Her  LFTs were normal, and her lipase was normal at 38.  She will be admitted for  abdominal pain.   ALLERGIES:  Patient is allergic to SULFA.   CURRENT MEDICATIONS:  1. Prednisone 5 mg daily.  2. Protonix 40 mg b.i.d.  3. Phenergan p.r.n.  4. Synthroid 0.1 daily.  5. ___________.  6. Pangestyme.   REVIEW OF SYSTEMS:  Denies chest pain or shortness of breath.  She does have  ongoing chronic stomatitis, steroid use, but this is fairly well controlled  at this time.   PHYSICAL EXAMINATION:  VITAL SIGNS:  Temp 98.2, respiratory rate 20, blood  pressure 160/90, pulse 78 and regular.  GENERAL:  HEENT:  TM's are normal.  Pupils are equal and reactive to light and  accommodation.  Oropharynx with black, hairy tongue.  Very minimal monilial  stomatitis.  NECK:  Supple without JVD, bruits, or thyromegaly.  LUNGS:  Clear in all areas.  HEART:  Regular rate and rhythm  without murmur, rub or gallop.  ABDOMEN:  Soft.  Positive bowel sounds.  Some mild right upper quadrant  tenderness.  NEUROLOGIC:  Grossly intact.  SKIN:  Warm and dry.   ASSESSMENT:  1. Abdominal pain.  2. Status post cholecystectomy.  3. Lupus with pemphigus complications.  4. Hypothyroidism.  5. Hypertension.     ___________________________________________                                         Kirk Ruths, M.D.   WMM/MEDQ  D:  02/27/2004  T:  02/27/2004  Job:  [id number]

## 2011-01-22 NOTE — Discharge Summary (Signed)
NAME:  [name], [name]                  ACCOUNT NO.:  [id number]   MEDICAL RECORD NO.:  [id number]          PATIENT TYPE:  INP   LOCATION:  A339                          FACILITY:  [facility]   PHYSICIAN:  Madelin Rear. Sherwood Gambler, MD  DATE OF BIRTH:  01-30-1946   DATE OF ADMISSION:  03/02/2005  DATE OF DISCHARGE:  07/06/2006LH                                 DISCHARGE SUMMARY   DISCHARGE MEDICATIONS:  1.  Diflucan 100 mg p.o. b.i.d.  2.  Valtrex 1000 mg p.o. b.i.d.  3.  Cymbalta 60 mg p.o. q.d.  4.  Norvasc 10 mg p.o. q.d.  5.  Duke's mixture p.r.n.  6.  Protonix 40 mg p.o. b.i.d.  7.  MiraLax p.r.n.  8.  Prednisone 10 mg p.o. b.i.d.  9.  Xanax 0.5 mg p.o. q.6h. p.r.n.  10. Ativan 2 mg t.i.d. p.r.n.   DISCHARGE DIAGNOSES:  1.  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  2.  Pemphigus vulgaris.  3.  Hypertension.  4.  Steroid-induced diabetes mellitus.   HOSPITAL COURSE:  The patient was admitted with recurrent abdominal pain  secondary to probable abdominal serositis and peritonitis secondary to  lupus.  She was wise enough to seek medical attention early on in her  symptoms as reflected in the past medical records.  She has had recurrent  bouts of this.  She responded very well to IV fluid, bowel rest, parenteral  proton pump inhibitor therapy and steroids.  She had incidental need for  coverage of sliding scale secondary to steroid-induced hyperglycemia.  On  the day of discharge, she was asymptomatic to her usual baseline state.  Incidentally, she had failed selective serotonin reuptake inhibitor therapy  and she was changed to Cymbalta with apparent good result.   FOLLOW UP:  She will follow up in the office in 1 week.       LJF/MEDQ  D:  03/11/2005  T:  03/11/2005  Job:  [id number]

## 2011-01-22 NOTE — H&P (Signed)
NAME:  [name], [name]                  ACCOUNT NO.:  [id number]   MEDICAL RECORD NO.:  [id number]           PATIENT TYPE:   LOCATION:                                 FACILITY:   PHYSICIAN:  Madelin Rear. Sherwood Gambler, MD  DATE OF BIRTH:  Mar 10, 1946   DATE OF ADMISSION:  DATE OF DISCHARGE:  LH                                HISTORY & PHYSICAL   CHIEF COMPLAINT:  Weight loss, thirst, sore mouth, polymyalgia and nausea.   HISTORY OF PRESENT ILLNESS:  The patient has had multiple days of  progressively increasing soreness to the mouth and odynophagia.  She has  noticed increase in her stomatitis lesions.  She has also had a polymyalgia  but no fevers, rigors or chills, cough or sputum.  Nausea has been present  without emesis, no hematemesis, hematochezia or melena.  No abdominal pain.   PAST MEDICAL HISTORY:  Is extensive, as outlined in previous records  including systemic lupus erythematosus, chronic anemia secondary to anemia  of chronic disease, diabetes mellitus type 2, hemorrhoids, pemphigus  vulgaris, treated H. 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 She has also had a previous history of  hypertension.  History of hypothyroidism.   SOCIAL HISTORY:  Nonsmoker and nondrinker.  Fairly depressed over her  chronic medical illnesses.   FAMILY HISTORY:  Positive for coronary artery disease and diabetes but  otherwise unremarkable.   REVIEW OF SYMPTOMS:  Positive for about a 15 pound weight loss over the past  month.  She has had anorexia secondary to nausea as well as painful  swallowing.  She has failed outpatient therapy.   PHYSICAL EXAMINATION:  GENERAL APPEARANCE:  She has a depressed affect.  Her  skin shows multiple erosions consistent with a flare of her pemphigus.  No  obvious areas of impetigo or cellulitis.  HEENT:  Head and neck showed positive severe ulcerative stomatitis and  ulceration including the buccal mucosa as well as tongue lesions  and  oropharynx.  No adenopathy.  CHEST:  Clear.  CARDIAC:  Regular rhythm.  ABDOMEN:  Soft, no organomegaly or masses.  EXTREMITIES:  Without cyanosis, clubbing or edema.  NEUROLOGICAL:  Examination is nonfocal.  VITAL SIGNS:  It should be noted her weight in the office was 115.5 and on  July 22, 2004 she was 132.   LABORATORY DATA:  Pending at present and will be reviewed when available.   IMPRESSION:  1.  Stomatitis/esophagitis secondary to Candidiasis, recurrent.  2.  Diabetes mellitus.  3.  Lupus erythematosus.  4.  Anemia of chronic disease.  5.  Hypothyroidism.  6.  Dehydration and malnutrition.  7.  Pemphigus vulgaris.   PLAN:  High intravenous dose steroids usually improves her collagen vascular  disease.  She will be begun on an aggressive antifungal regimen including  intravenous Diflucan.  Gastroenterology consultation will be sought as well  as nutrition consultation.  I will give her an appetite suppressant as well  consisting of Marinol 2.5 mg p.o. b.i.d.  Symptomatic therapy for  polymyalgia and polyarthralgia with narcotic analgesics  as indicated.      LJF/MEDQ  D:  10/16/2004  T:  10/16/2004  Job:  [id number]

## 2011-01-22 NOTE — Procedures (Signed)
NAME:  [name], [name]                  ACCOUNT NO.:  [id number]   MEDICAL RECORD NO.:  [id number]          PATIENT TYPE:  INP   LOCATION:  A208                          FACILITY:  [facility]   PHYSICIAN:  Dani Gobble, MD       DATE OF BIRTH:  09/14/1945   DATE OF PROCEDURE:  01/25/2006  DATE OF DISCHARGE:                                  ECHOCARDIOGRAM   INDICATION:  Evaluate left ventricular function.   The technical quality of the study is adequate.   The aorta measures normally at 2.9 cm.   The left atrium measures normally at 3.6 cm.  No obvious clots or masses  were appreciated and the patient appeared to be in sinus rhythm during this  procedure.   The interventricular septum reveals basal septal hypertrophy.  Otherwise the  wall thickness is normal.   The aortic valve is probably trileaflet.  There is mild thickening without  limitation of leaflet excursion.  No aortic insufficiency is noted.  Doppler  interrogation of aortic valve is within normal limits.   The mitral valve appears grossly structurally normal.  No mitral valve  prolapse is noted.  Mild mitral regurgitation is noted.   Doppler interrogation of the mitral valve is within normal limits.   Pulmonic valve reveals trivial pulmonic insufficiency.   Tricuspid valve appears grossly structurally normal with mild to moderate  tricuspid regurgitation, leaning more towards the milder end of the  spectrum.  There is no suggestion of pulmonary hypertension.   The left ventricle is normal in size with LV IDD measured 3.7 cm.  LV IC  measured 2.3 cm.  Overall left systolic function is normal and no regional  wall motion abnormalities are noted.   The right atrium, right ventricle are normal size and right ventricular  systolic function is normal.   IMPRESSION:  1.  Basal septal hypertrophy.  2.  Mild aortic sclerosis without stenosis.  3.  Mild mitral regurgitation.  4.  Trivial pulmonic insufficiency.  5.  Mild to  moderate tricuspid regurgitation leaning towards the milder end      of the spectrum.  6.  Normal left ventricular size and systolic function without regional wall      motion abnormality noted           ______________________________  Dani Gobble, MD     AB/MEDQ  D:  01/25/2006  T:  01/25/2006  Job:  [id number]

## 2011-01-22 NOTE — H&P (Signed)
NAMEAMARIANA, [name]                  ACCOUNT NO.:  [id number]   MEDICAL RECORD NO.:  [id number]          PATIENT TYPE:  INP   LOCATION:  A219                          FACILITY:  [facility]   PHYSICIAN:  Patrica Duel, M.D.    DATE OF BIRTH:  [birth year]   DATE OF ADMISSION:  12/28/2005  DATE OF DISCHARGE:  LH                                HISTORY & PHYSICAL   CHIEF COMPLAINT:  Abdominal pain.   HISTORY OF PRESENT ILLNESS:  This is a very unfortunate 65 year old female  with systemic lupus erythematosus, Pemphigus vulgaris, and polyserositis  secondary to the above.  She has had recurrent admissions for the same.  She  was last admitted with abdominal pain in March.  Work-up essentially benign.  She did respond to steroids and hydration and analgesic therapy.   The patient presented to office today complaining of severe abdominal pain  with nausea, vomiting.  She had been seen in the emergency department.  Records are currently unavailable.  She was treated symptomatically and sent  home.  Besides nausea and vomiting she denies hematemesis, hematochezia,  melena, fevers, rigors, or chills.  She does have diffuse abdominal pain  associated with repetitive vomiting and poor oral intake.  There has been no  chest pain, shortness of breath, headache, neurologic deficits,  genitourinary symptoms.   Patient is admitted with intractable abdominal pain and nausea, vomiting  recurrent in nature.   CURRENT MEDICATIONS:  1.  Norvasc 10 daily.  2.  Zoloft 50 daily.  3.  Valtrex 500 daily.  4.  Sucralfate 1 g daily.  5.  Morphine 60 mg b.i.d.  6.  Omeprazole 20 daily.  7.  Synthroid 125 daily.  8.  Phenergan p.r.n.  9.  She is also on Magic mouthwash p.r.n.   ALLERGIES:  SULFA, THIAZIDES, and LOOP DIURETICS.   PAST HISTORY:  As noted above.  She also has history of hypothyroidism which  is compensated.  There is a chronic anemia associated with her lupus.  Hypertension has been well  controlled.  There is a remote history of  hyperlipidemia.   SURGICAL HISTORY:  Significant for a partial hysterectomy as well as  oophorectomy five years later.  She has had back surgery, gallbladder, and  cholecystectomy and appendectomy.   SOCIAL HISTORY:  Nonsmoker.  Nondrinker.   FAMILY HISTORY:  Noncontributory.   REVIEW OF SYSTEMS:  Negative except as mentioned.   PHYSICAL EXAMINATION:  GENERAL:  This is a chronically ill-appearing female  who is in marked distress and crying and moaning incessantly.  VITAL SIGNS:  She is afebrile.  Heart rate is approximately 80, blood  pressure 110/66, weight 108 pounds down from 110 in December this year.  HEENT:  Normocephalic, atraumatic.  Pupils are equal.  There is mild  mucositis present.  NECK:  Supple without masses, thyromegaly, lymphadenopathy.  LUNGS:  Clear.  HEART:  Sounds are normal.  ABDOMEN:  Moderately tender in the epigastrium.  There are no masses,  organomegaly noted.  EXTREMITIES:  No clubbing, cyanosis, edema.  NEUROLOGIC:  Nonfocal.   ASSESSMENT:  Severe abdominal pain secondary to polyserositis most likely.   PLAN:  Admit for pain control, steroids, hydration, and GI consult.  Will  follow and treat expectantly.      Patrica Duel, M.D.  Electronically Signed     MC/MEDQ  D:  12/28/2005  T:  12/28/2005  Job:  [id number]

## 2011-01-22 NOTE — Discharge Summary (Signed)
NAME:  [name], [name]                  ACCOUNT NO.:  [id number]   MEDICAL RECORD NO.:  [id number]          PATIENT TYPE:  INP   LOCATION:  A322                          FACILITY:  [facility]   PHYSICIAN:  Madelin Rear. Sherwood Gambler, MD  DATE OF BIRTH:  1946/04/27   DATE OF ADMISSION:  07/04/2006  DATE OF DISCHARGE:  11/02/2007LH                               DISCHARGE SUMMARY   DISCHARGE MEDICATIONS:  1. Synthroid 25 mcg daily.  2. Simvastatin 20 mg daily.  3. Atenolol 12 mg p.o. b.i.d.  4. Lasix 20 mg t.i.d.  5. Protonix 40 b.i.d.  6. Lyrica 75 t.i.d.  7. Reglan 5 mg q.i.d.  8. Dilaudid 2 to 4 mg q.4h. p.r.n. pain.   DISCHARGE DIAGNOSES:  1. Pneumonia.  2. Systemic lupus erythematosus.  3. Pemphigus vulgaris.  4. Hemorrhoids.  5. Diabetes mellitus.  6. Hypothyroidism.  7. Anemia of chronic disease.  8. Hypertension.   HOSPITAL COURSE:  The patient was admitted with cough, sputum and fever.  She showed classic clinical pneumonia complicated by reactive airway  disease and wheezing.  Bronchodilators and antibiotics were successful  in controlling her symptoms as well as improving her clinically to the  point of discharge.  She will followup in the office in one week post  discharge.      Madelin Rear. Sherwood Gambler, MD  Electronically Signed     LJF/MEDQ  D:  08/22/2006  T:  08/22/2006  Job:  [id number]

## 2011-01-22 NOTE — H&P (Signed)
NAMESHYANE, [name]                  ACCOUNT NO.:  [id number]   MEDICAL RECORD NO.:  [id number]          PATIENT TYPE:  INP   LOCATION:  A209                          FACILITY:  [facility]   PHYSICIAN:  Patrica Duel, M.D.    DATE OF BIRTH:  11-Mar-1946   DATE OF ADMISSION:  12/16/2004  DATE OF DISCHARGE:  LH                                HISTORY & PHYSICAL   CHIEF COMPLAINT:  Chest and abdominal pain.   HISTORY OF PRESENT ILLNESS:  This is a very unfortunate 65 year old female  with the longstanding history of systemic lupus erythematosus and  __________.  She is long-term steroid dependent and has a history of  diabetes mellitus, type 2, as well as anemia of chronic disease.  She has  had a history of gastrointestinal bleeding secondary to hemorrhoids,  compensated hypothyroidism, and hypertension which has been well-controlled.   The patient has been admitted on several occasions with intractable  mucositis.  She has done quite well for the past several weeks.  She  presented to the emergency department with the sudden onset of chest and  epigastric abdominal pain associated with apparent severe anxiety.  Her  workup in the emergency department was unrevealing.  A CBC revealed normal  white count and a normal H&H, platelet count, etc.  MET-7 essentially  normal, except for a glucose of 105 and __________ 12 normal.  Amylase and  lipase negative.  Cardiac markers negative.  EKG nonacute.   The patient was admitted with chest and abdominal pain of questionable  etiology.  It is very possible that anxiety is playing a major role in her  presentation at this time.   There is no history of increasing headache, neurologic deficits, nausea,  vomiting, diarrhea, melena, hematemesis, hematochezia, or genitourinary  symptoms.  Her mucositis has been quite stable.   CURRENT MEDICATIONS:  1.  Morphine 60 mg p.o. b.i.d.  2.  Norvasc 10 mg daily.  3.  Xanax 0.5 q.6h. p.r.n.  4.  Zoloft 50  daily.  5.  Diflucan 150 mg p.r.n.  6.  Valtrex 1 g p.o. b.i.d. p.r.n.  7.  Prednisone 10 mg daily.   ALLERGIES:  SULFA.   PAST MEDICAL HISTORY:  As reviewed above.  She has been seen at [facility] for these problems.   REVIEW OF SYSTEMS:  Negative, except as mentioned.   FAMILY HISTORY:  Noncontributory.   SOCIAL HISTORY:  Nonsmoker, nondrinker.  She is readily independent at home.   PHYSICAL EXAMINATION:  GENERAL:  This is a very pleasant but anxious female  who is somewhat tearful and in no acute distress.  VITAL SIGNS:  On presentation, temperature 96.9 degrees, blood pressure  147/46, pulse 120, soon normalized to 86; respirations 18, unlabored; O2  saturation 100%.  HEENT:  Normocephalic, atraumatic.  Pupils equal.  Ears, nose, throat  benign.  Oropharynx reveals mild glossitis only.  Pharynx is clear.  NECK:  Supple without bruits, masses, or thyromegaly.  LUNGS:  Clear to A&P.  HEART:  Heart sounds are normal without murmurs, rubs, or gallops.  ABDOMEN:  Mild epigastric tenderness without guarding, rebound, or masses.  EXTREMITIES:  No clubbing, cyanosis, or edema.  No active joint inflammation  is present.  NEUROLOGIC:  Nonfocal.   ASSESSMENT:  Chest and abdominal pain associated with longstanding  diagnosis, as noted above.  Currently, cardiac workup is benign.  Electrocardiogram unremarkable and symptom complex incompatible with  coronary syndrome.  Consider gastritis related to steroids.  Of note, she is  status post cholecystectomy.   PLAN:  IV PPIs and Carafate.  Increase SSRI and administer IV steroids  temporarily.  Change Xanax to Ativan 1 mg t.i.d. p.r.n.  Consider  psychiatric consult as outpatient.  Will follow and treat expectantly.      MC/MEDQ  D:  12/17/2004  T:  12/17/2004  Job:  [id number]

## 2011-01-22 NOTE — H&P (Signed)
NAME:  [name], [name]                  ACCOUNT NO.:  [id number]   MEDICAL RECORD NO.:  [id number]          PATIENT TYPE:  INP   LOCATION:  A322                          FACILITY:  [facility]   PHYSICIAN:  Madelin Rear. Sherwood Gambler, MD  DATE OF BIRTH:  1945/11/11   DATE OF ADMISSION:  07/04/2006  DATE OF DISCHARGE:  LH                                HISTORY & PHYSICAL   CHIEF COMPLAINT:  Cough.   HISTORY OF PRESENT ILLNESS:  The patient has multiple days, up to one week,  of a cough, malaise, and mild shortness of breath.  She is intractably  coughing, bringing up scanty amounts of sputum.  She denies hemoptysis.  There has been no chest pain, palpitations, or syncope.   PAST MEDICAL HISTORY:  1. Systemic lupus erythematosus, steroid dependent.  2. Previous Helicobacter pylori treatment.  3. Pemphigus vulgaris.  4. Hemorrhoids.  5. Diabetes mellitus.  6. Hypothyroidism.  7. 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  8. Hypertension.   SOCIAL HISTORY:  Nonsmoker, no drinker, no illicit drug use.   FAMILY HISTORY:  Noncontributory.   REVIEW OF SYSTEMS:  Under HPI.  All else negative.   PHYSICAL EXAMINATION:  SKIN:  Unremarkable.  HEAD/NECK:  No JVD or adenopathy.  Neck supple.  CHEST:  Diffuse inspiratory and expiratory wheezing.  Scattered rhonchi.  Marked rales noted at the left lower base.  CARDIAC:  Regular rhythm.  No gallop or rub.  ABDOMEN:  Benign.  EXTREMITIES:  Without clubbing, cyanosis, or edema.  NEUROLOGIC:  Nonfocal.   IMPRESSION:  1. Clinically obvious pneumonia.  Will confirm with chest x-ray.  The      patient appears toxic enough to require admission.  Will get      laboratories and review them when available.  She will be started on      parenteral antibiotics for good community-acquired infection coverage.  2. Systemic lupus erythematosus.  Currently, she is symptom free.      Maintain her steroids at a higher stress dose in light of the ongoing  infection.  3. Hypertension.  Well controlled.  Intervene as needed.  4. Pemphigus vulgaris.  Skin lesions quiescent at present.  Monitor.      Intervene as needed.  5. Diabetes mellitus.  Sliding scale coverage of blood sugars.  6. Chronic anemia.  Check labs.  Intervene as needed.      Madelin Rear. Sherwood Gambler, MD  Electronically Signed     LJF/MEDQ  D:  07/04/2006  T:  07/05/2006  Job:  [id number]

## 2011-01-22 NOTE — H&P (Signed)
[name], [name]                            ACCOUNT NO.:  [id number]   MEDICAL RECORD NO.:  [id number]                   PATIENT TYPE:   LOCATION:  322                                  FACILITY:  [facility]   PHYSICIAN:  Patrica Duel, M.D.                 DATE OF BIRTH:  Dec 17, 1945   DATE OF ADMISSION:  12/20/2002  DATE OF DISCHARGE:                                HISTORY & PHYSICAL   CHIEF COMPLAINT:  Abdominal pain.   HISTORY OF PRESENT ILLNESS:  This is a 65 year old female with a history of  lupus erythematosus pemphigus, treated H. pylori, gastritis, hypothyroidism.  She is status post partial hysterectomy, back surgery, and cholecystectomy,  as well as appendectomy.   The patient presented to the office approximately one week ago complaining  of abdominal pain.  Workup revealed H. pylori.  Apparently, she had been  treated in the past and repeat Prevpac was ordered.  This was not tolerated.  She experienced increasingly severe epigastric abdominal pain, nausea, and  vomiting.  Of note, the patient has been on Glucophage for approximately six  months when her blood sugar rose after steroid administration.  Some of her  symptoms she relates back to the initiation of Metformin.   Physical examination in the office revealed the presence of yeast glossitis,  as well as epigastric tenderness.   Full lab review was obtained which revealed mild leukopenia (WBC 3.2), as  well as a mild anemia with hemoglobin of 10.7.  All other parameters were  normal except for SGOT of 71, SGPT of 71, and amylase of 265.  Of note,  lipase normal at 46.   An ultrasound obtained the day of admission revealed fatty liver with no  pancreatic pseudocyst or focal abnormalities.  The common bile duct measured  7 mm, which was within normal limits.   The patient was begun on Nystatin suspension yesterday and Glucophage was  discontinued.  She was also begun on Amaryl 1 mg and Reglan 5 mg daily.  She  was maintained on her Aciphex.   The patient came back to the office as instructed.  She has ongoing symptoms  with abdominal pain and nausea.  Admission for full GI evaluation is  indicated at this time and arrangements will be made.   There is no history of headache, neurologic deficits, chest pain, shortness  of breath, or genitourinary symptoms.   CURRENT MEDICATIONS:  1. Levoxyl 100 mg daily.  2. Cardizem CD 120 daily.  3. Prevpac has been discontinued.   ALLERGIES:  SULFA.   PAST MEDICAL HISTORY:  As noted above.   REVIEW OF SYSTEMS:  Negative except as mentioned.  She has had no melena,  hematemesis, or hematochezia.   FAMILY HISTORY:  Noncontributory.   PHYSICAL EXAMINATION:  GENERAL:  A very pleasant female who is quite  depressed and tearful.  VITAL SIGNS:  Temperature  98, pulse is 100, respirations 16 and unlabored,  blood pressure 140/80.  HEENT:  Normocephalic, atraumatic.  Pupils are equal.  Nose, ears, throat:  Benign except for quite severe yeast glossitis.  NECK:  Supple.  No bruits, thyromegaly, or lymphadenopathy.  LUNGS:  Clear to A&P.  CARDIOVASCULAR:  Heart sounds are normal without murmurs, rubs, or gallops.  ABDOMEN:  Nondistended.  There is moderate epigastric tenderness without  guarding or rebound present.  EXTREMITIES:  No clubbing, cyanosis, or edema.  NEUROLOGIC:  No focal deficits.   LABORATORY DATA:  Pertinent labs as noted above.   ASSESSMENT:  Chronic abdominal pain with hyperamylasemia of questionable  significance.  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  Other  problems, as noted above, are stable at this time.   PLAN:  Admit for intravenous hydration, full gastrointestinal workup.  We  will follow and treat expectantly the patient.                                               Patrica Duel, M.D.    MC/MEDQ  D:  12/20/2002  T:  12/20/2002  Job:   [id number]

## 2011-01-22 NOTE — H&P (Signed)
[name], [name]                            ACCOUNT NO.:  [id number]   MEDICAL RECORD NO.:  [id number]                  PATIENT TYPE:   LOCATION:                                       FACILITY:  [facility]   PHYSICIAN:  Patrica Duel, M.D.                 DATE OF BIRTH:  11-Mar-1946   DATE OF ADMISSION:  11/12/2003  DATE OF DISCHARGE:                                HISTORY & PHYSICAL   CHIEF COMPLAINT:  Severe mouth pain.   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  She has  had multiple episodes of severe mucositis/glossitis which has been  refractory to anything but high-dose steroid therapy.  She has previously  been evaluated and treated at [facility].   The patient was seen yesterday, in the office, by Dr. Sherwood Gambler with a several  day history of increasingly severe pain in her oropharynx and throat.  She  also has moderate dysphagia in the pharyngeal region. He sent the patient to  [facility] where she was not seen by a physician  because she had to wait too long.  She presented back to the emergency  department with the same complaint.   In the emergency department, the patient as found to have severe  mucositis/glossitis.  Laboratory review revealed a white count of 3000, H&H  of 10 and 30, normal chemistries and electrolytes and a sedimentation rate  of 123.  She was also febrile with a temperature of 101 degrees.  The  patient was admitted with recurrent severe glossitis refractory to  outpatient therapy.   There is no history of headache, neurologic __________, shortness of breath,  nausea, vomiting, diarrhea, or genitourinary symptoms.   CURRENT MEDICATIONS:  1. Synthroid 0.125.  2. Diflucan 100 mg b.i.d.  3. Nystatin suspension.   ALLERGIES:  None documented.   PAST HISTORY:  As noted above.  She also has compensated  hypothyroidism.   REVIEW OF SYSTEMS:  Negative except as mentioned.   SOCIAL HISTORY:  Nonsmoker, nondrinker.  Denies illicit drug use.   FAMILY HISTORY:  Noncontributory.   PHYSICAL EXAMINATION:  GENERAL:  Reveals a very pleasant female who is  moderate distress.  VITAL SIGNS:  At presentation, temperature 101.8, blood pressure 160/83,  heart rate 115, respirations 20.  HEENT:  Normocephalic, atraumatic.  Pupils are equal.  Ears are benign.  The  oropharynx reveals severe mucositis/glossitis with multiple focal  ulcerations and white coating.  This extends to the visible pharynx.  NECK:  The neck is supple without adenopathy or thyromegaly.  LUNGS:  Clear to A&P.  HEART:  Sounds are apparently normal without murmurs, rubs, or gallops.  ABDOMEN:  Nontender, nondistended.  Bowel sounds are intact.  EXTREMITIES:  No clubbing, cyanosis, or edema.  NEUROLOGIC:  Exam  is without focal deficits.   ASSESSMENT:  1. Lupus erythematous as well as pemphigus both well documented.  Currently     presenting with severe mucositis/glossitis.  2. This patient is also febrile which is probably related to her underlying     illness though other etiologies need to be considered.   PLAN:  High dose of steroids, appropriate cultures and symptomatic therapy.  Will follow and treat expectantly.  Consider transfer to [facility] when bed is available.     ___________________________________________                                         Patrica Duel, M.D.   MC/MEDQ  D:  11/12/2003  T:  11/12/2003  Job:  [id number]

## 2011-01-22 NOTE — Discharge Summary (Signed)
[name], [name]                            ACCOUNT NO.:  [id number]   MEDICAL RECORD NO.:  [id number]                   PATIENT TYPE:  INP   LOCATION:  A319                                 FACILITY:  [facility]   PHYSICIAN:  Kirk Ruths, M.D.            DATE OF BIRTH:  [birth year]   DATE OF ADMISSION:  03/17/2004  DATE OF DISCHARGE:  03/24/2004                                 DISCHARGE SUMMARY   DISCHARGE DIAGNOSES:  1. Chest pain, noncardiac.  2. Generalized pain secondary to systemic lupus erythematosus/pemphigus.  3. Gastroesophageal reflux disease.  4. Hypokalemia.   HOSPITAL COURSE:  This 65 year old black female who is recently discharged  from the hospital for similar problems that were GI related was admitted  complaining of substernal epigastric chest pain with nausea. In the  emergency room, enzymes and EKG were normal, although patient had a  tachycardia. [name] to severity of her pain and accompanying medical problems,  she was admitted for evaluation, rule out MI, cardiology consult. The  patient was admitted to the floor. Serial EKGs were within normal although  she did have a tachycardia of the 140s. Labetalol was begun 200 b.i.d. with  excellent control of her tachycardia as well as her blood pressure. The  patient underwent cardiology consult with Dr. Tresa Endo, and echocardiogram was  normal. Enzymes were negative. Dr. Tresa Endo felt this was not cardiac chest  pain, probably GI related. She incidentally had a potassium of 3.1 on  admission. This was corrected during her stay. She also had a moderately  elevated D-dimer of 1.5. CT scan of chest showed no evidence of pulmonary  embolus. The patient's chest pain improved, but she reverted to her previous  aching all over with a right scapular type severe pain. IV steroids at that  time were started along with Duragesic patch with significant improvement  within 24 hours. The patient was changed over to oral prednisone the  day  before discharge, and she has been the best she has felt for some time. She  will be discharged home to be followed in the office in one month.   DISCHARGE MEDICATIONS:  Her medications include:  1. Synthroid 125 mg.  2. Norvasc 10 mg daily.  3. Protonix twice a day.  4. Stool softener.  5. Nitroglycerin p.r.n.  6. She was written a prescription for labetalol 200 twice a day.  7. Duragesic 50 mcg patches.  8. Prednisone was continued. She will take 40 mg a day, decreasing by 5 mg     daily, until she gets to 10 mg daily and will continue at that dose.   DISPOSITION:  She will be followed in the office in one month. The patient  was stable at the time of discharge.    ___________________________________________  Kirk Ruths, M.D.   WMM/MEDQ  D:  03/24/2004  T:  03/24/2004  Job:  [id number]

## 2011-01-22 NOTE — Procedures (Signed)
NAME:  [name], [name]                  ACCOUNT NO.:  [id number]   MEDICAL RECORD NO.:  [id number]          PATIENT TYPE:  INP   LOCATION:  A201                          FACILITY:  [facility]   PHYSICIAN:  Richard A. Alanda Amass, M.D.DATE OF BIRTH:  November 28, 1945   DATE OF PROCEDURE:  09/20/2005  DATE OF DISCHARGE:                                  ECHOCARDIOGRAM   This 65 year old lady has a history of abdominal pain, GERD, diabetes and  there is some concern about symptoms compatible with pericarditis   Echo was technically adequate for interpretation.   The aorta is normal at 2.8 cm.   The aortic valve has three leaflets, opens well. There is very mild aortic  sclerosis and no aortic stenosis. There is no aortic vegetation   The left atrium is WNL and measures 3.4 cm. The patient was in sinus rhythm  during the procedure, and there are no clots seen.   IBS is focally thickened in the upper portion compatible with DUST  (disproportionate upper septal thickening) measuring 1.5 cm. The remainder  of the septum appears normal with normal contraction pattern. LVPW is normal  at 1.1 cm. There is no outflow tract obstruction and no SAM (systolic  anterior motion of the mitral valve) to suggest obstruction or IHSS.   Mitral valve opens normally. There is mild mitral regurgitation and mild  mitral annular calcification.   The left ventricle shows a small cavity with LVIDD measuring 3.5 and LVISD  measuring 2.2 cm. There is normal thickening of all visualized segments with  no segmental wall motion abnormality and EF greater than 60% estimated. LV  inflow signal shows E to A reversal compatible with diastolic relaxation  abnormality.   Right ventricle is normal.   Tricuspid valve appears grossly normal with mild TR.   There is no pericardial effusion, and the pericardium appears normal.   Two-D echo demonstrates moderate disproportionate upper septal thickening  (DUST) with no outflow tract  obstruction. Small LV cavity with normal  systolic function and LV inflow signal compatible with diastolic relaxation  abnormality. No significant valvular disease. No prior pericardial effusion  or pericardial abnormalities noted.      Richard A. Alanda Amass, M.D.  Electronically Signed     RAW/MEDQ  D:  09/20/2005  T:  09/21/2005  Job:  [id number]   cc:   Dani Gobble, MD  Fax: [phone]

## 2011-01-22 NOTE — H&P (Signed)
NAME:  [name], [name]                  ACCOUNT NO.:  [id number]   MEDICAL RECORD NO.:  [id number]          PATIENT TYPE:  INP   LOCATION:  A201                          FACILITY:  [facility]   PHYSICIAN:  Madelin Rear. Sherwood Gambler, MD  DATE OF BIRTH:  21-Jul-1946   DATE OF ADMISSION:  DATE OF DISCHARGE:  LH                              HISTORY & PHYSICAL   CHIEF COMPLAINT:  Cough, fever, malaise.   HISTORY OF PRESENT ILLNESS:  The patient has had 7 days of cough, fever,  and malaise, increasing with polymyalgia, dizziness, and weakness today.  She has had scanty sputum production.  No hemoptysis.  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  She had no true rigors, positive chills.  Oral intake has been  down.  Dizziness as above.  No syncope.  No chest pain, palpitations.  She also complains of increasing nausea and abdominal pain, which is  mild to moderate in nature.  Location is the epigastric area and  somewhat pan-abdominal.  No local areas particularly uncomfortable.   PAST MEDICAL HISTORY:  Lupus; pemphigus vulgaris, steroid-dependent;  anemia of chronic disease; diabetes mellitus; hypothyroidism;  hypertension; and previously treated H. pylori.   SOCIAL HISTORY:  Nonsmoker.  Nondrinker.  No abused drugs or alcohol.   FAMILY HISTORY:  Fairly unremarkable.   REVIEW OF SYSTEMS:  As under HPI, in detail.  Ten systems reviewed and  negative, except as under HPI.   PHYSICAL EXAMINATION:  GENERAL:  She appears chronically ill with acute  overlay.  She is in no acute distress.  HEENT:  Her head and neck show no JVD or adenopathy.  CHEST:  Chest is with diffuse scattered wheezing and rhonchi.  No rales  appreciated.  CARDIAC EXAM:  No gallop, murmur, or rub.  ABDOMEN:  No guarding, rebound, tenderness, organomegaly, or masses.  EXTREMITIES:  The extremities are without  clubbing, cyanosis, or edema.  NEUROLOGIC EXAM:  Nonfocal.   LABORATORY DATA:  Laboratories are pending at present, and will be  reviewed when available.   IMPRESSION:  1. Probable clinical pneumonia with superimposed Addisonian crisis.      The patient will be admitted for intravenous therapy, increased      dose steroid therapy, and antibiotics.  Respiratory support and      bronchodilators.  Complications of her lupus will be dealt with as      identified.  2. Hypertension, not an issue at present.  She is hypotensive in the      office today at 80/50.  Will address when needed.  Continue her      beta blocker to prevent beta blocker withdrawal symptomatology.  3. Hypothyroidism.  Continue to replete her thyroid hormone.  4. Lupus, as above.  Intervene as needed with higher dose steroids.      This should not be an issue.  5. Pemphigus.  No active skin lesions at present.      Madelin Rear. Sherwood Gambler, MD  Electronically  Signed     LJF/MEDQ  D:  12/21/2006  T:  12/21/2006  Job:  [id number]

## 2011-01-22 NOTE — Discharge Summary (Signed)
NAMEJAYDY, [name]                  ACCOUNT NO.:  [id number]   MEDICAL RECORD NO.:  [id number]          PATIENT TYPE:  INP   LOCATION:  A303                          FACILITY:  [facility]   PHYSICIAN:  Patrica Duel, M.D.    DATE OF BIRTH:  Apr 05, 1946   DATE OF ADMISSION:  05/20/2006  DATE OF DISCHARGE:  09/21/2007LH                                 DISCHARGE SUMMARY   DISCHARGE DIAGNOSES:  1. 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  2. 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  3. Severe depression.  4. Hypertension controlled.  5. Recurrent glossitis and esophagitis currently stable.  6. Chronic abdominal pain secondary to #1.  7. Compensated hypothyroidism.   HISTORY:  For details regarding admission, please refer to the admitting  note from the 14th.  Briefly, this is a 65 year old female with the above  diagnosis who has been admitted multiple times with flares of her lupus with  diffuse abdominal pain as well as myalgia and back pain.  She presented to  the emergency department with mild dysphasia and diffuse pain as well as  obvious depression.  I had seen her 3 days prior to this admission and  placed her on prednisone 10 mg b.i.d.   The patient presented to the emergency department with the same complaints.  Lab evaluation revealed a mild leukopenia with a white count of 2900,  hemoglobin 9.6, hematocrit 28, platelet count of 292,000.  Her sed rate was  103.  Chemistries were normal except for a glucose of 106 and a creatinine  of 1.3.   Of note, the patient had undergone repeated evaluations for the same  complaints by the GI service.  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   The patient was admitted for control of her protracted nausea, vomiting,  severe pain associated with lupus and a markedly elevated sed rate.   COURSE IN THE HOSPITAL:  The patient was placed on high dose IV steroids as  well as IV  PPIs and IV Dilaudid.  She continued to complain of back pain. A  CT scan was obtained which was unremarkable.  A CT scan of her T-spine  showed a central compression fracture of superior end-plate T4 age  indeterminate and multilevel degenerative spurring and disk bulges.  A CT  scan of her abdomen revealed diffuse soft tissue edema; liver, spleen,  pancreas, kidneys, adrenal glands were normal; stomach incompletely  distended and wall thickness suboptimally assessed.  The pelvis was benign  except for scattered soft tissue edema, no active intrapelvic abnormalities.  A nuclear bone scan was ordered by Dr. Hilda Lias which was essentially  negative for acute fracture.  Symptomatic therapy suggested by Dr. Hilda Lias.   The patient's major complaint has been diffuse pain.  This has improved  though she insists on IV Dilaudid instead of p.o. which had been controlling  her symptoms as an outpatient.  Duragesic patch was offered and this was  flatly declined.   Currently, the patient is ambulating in the halls and doing much  better.  She remains depressed and this has been a chronic problem with her.  We have  offered placement in the past as she is somewhat dependent but has no one to  care for her.  She was briefly placed at [facility] and left apparently AMA  in the recent past.   At this point the patient is stable on p.o. medications, is eating well and  ambulating in the halls without difficulty and is stable for discharge; her  status remains somewhat tenuous.   DISPOSITION:  Medications include prednisone 20 mg b.i.d. x7 days and then  decreasing to 30 mg daily and will taper as tolerated.  She will continue  Synthroid 125 daily, atenolol 25 b.i.d., simvastatin 20 daily, Lasix 20  daily, Dilaudid 4 mg q.4 p.r.n. pain, Protonix 40 b.i.d., metoclopramide 5  q.i.d. a.c. and h.s. and Lyrica 75 t.i.d.   ALLERGIES:  PLEASE NOTE ALLERGIES TO SULFA.      Patrica Duel, M.D.   Electronically Signed     MC/MEDQ  D:  05/27/2006  T:  05/29/2006  Job:  [id number]

## 2011-01-22 NOTE — Consult Note (Signed)
NAME:  [name], [name]                  ACCOUNT NO.:  [id number]   MEDICAL RECORD NO.:  [id number]          PATIENT TYPE:  INP   LOCATION:  A336                          FACILITY:  [facility]   PHYSICIAN:  Tilda Burrow, M.D. DATE OF BIRTH:  Jan 09, 1946   DATE OF CONSULTATION:  09/09/2005  DATE OF DISCHARGE:                                   CONSULTATION   CHIEF COMPLAINT:  Lower abdominal pain, perceived by patient since May 24, 2005 anterior and posterior repair.   HISTORY OF PRESENT ILLNESS:  This 65 year old female with medical problems  of lupus erythematosus, generalized poor nutrition, and with a longstanding  history of chronic right lower quadrant ache is consulted due to pelvic pain  in the right lower quadrant which the patient associates with beginning at  the time of her September 2006 surgery.  The patient reports that she  recalls the pain since wakening from the surgery performed on May 24, 2005 where anterior and posterior repair were performed.  These surgeries  were performed over the entirety of the anterior vaginal wall as well as the  lower one half of the posterior vaginal wall.  The surgeries were reportedly  uncomplicated, but the patient's pain levels were sufficiently severe that  she was kept an additional day after the surgery.  She was not febrile.  Post-surgical course has since that time the patient's history has been that  she has been able to walk and that the pain is sometimes worse earlier in  the day and improves slightly with the day's activities.  Bowel movements  come only with difficulty.  This hospitalization to date has been notable  for upper abdomen endoscopy which showed delayed exit from the abdomen with  stomach contents noted greater than 24 hours after ingestion.  The patient  describes the pain as deep inside the pelvis, points to the right lower  quadrant just inside the midline incision with a description of the pain  moving  over to the right side.  She denies loss or urine at this time.  She  does perceive a bulging sensation at the introitus when she sits to void.  She claims to not have any significant nocturia or stress incontinence.  She  is not sexually active.   Review of old records from her office visits show that the patient had  almost identical right lower quadrant pain complaints when I first saw her  in the office on Feb 01, 2005 when she complained of the pain being chronic  for years, perceiving something is in there.  At that time in May 2006 she  reported that the right lower quadrant pain had been there since her second  surgery which was a bilateral salpingo-oophorectomy.  She described herself  at that time as being unable to rest on the right side at night due to the  pain.  Further history from May 2006 describes the patient as having bowel  movements q.3 days with difficulty with defecation.  She had clinically  obvious cystocele which gave her pelvic discomfort and  resulted in her  followup in the office for anterior and posterior repair.  This was  postponed at the patient request from May of 2006 until September when the  patient returned requesting that.  The patient also had an evaluation during  2006 by Dr. Rito Ehrlich.  In May 2006, evaluation by Dr. Rito Ehrlich showed a  negative urinalysis.  Upon her return to evaluation in August 2006, she was  confirmed as mildly anemic without any identifiable blood loss with  hemoglobin 9.8, hematocrit 31.5.  A decision was made for surgery, and she  was taken to the operating room where we could not identify any mass effect  on pelvic exam.  The peritoneal cavity was NOT ENTERED during the anterior  and posterior repair.   Postoperative course during September and October was noted by moderate  discomfort out of proportion to what we would expect with the clinical  findings, did not show any pelvic abnormality except for a water vaginal   discharge noted shortly after surgery which seemed to respond to vaginal  MetroGel.  While she healed slowly, the ultimate result seemed reasonable.  She was last seen June 21, 2005 at which time she was continued on  MiraLax due to the chronic constipation.   Physical exam at this time shows the patient to be active, able to ambulate  in the hallway without any gait limitations. She moves quickly on the bed  without gait or motion abnormality.  Abdomen shows two well-healed midline  incisions, unchanged from prior evaluations.  Exam of external genitalia  shows normal post-surgical changes.  She does have some recurrence of the  cystocele, not as much as before the surgery.  Speculum exam shows a slight  fibrous band two thirds the way up the vaginal on the anterior right side  which does not seem to be particularly tender.  The vaginal length is good,  though the caliber of the vaginal apex is somewhat narrowed.  Digital exam  is performed being careful to identify pain sites.  Left lateral vaginal  sidewall is described as not tender.  The right vaginal sidewall is only  slightly tender, consistent with what was expected for a normal exam.  The  fibrotic band on the right anterior side is specifically and described by  the patient as nontender.  She describes the pain as further up inside and,  indeed, vaginal length is good, and mobility of the vaginal apex is good,  and the vaginal apex deviate slightly to the patient's right.  The patient's  most dramatic pain response is when I palpate some well-formed stool in the  sigmoid colon just above the vaginal apex.  This is dramatically different  and causes the patient to reflexively move and guard.  Posterior palpation  of the rectum closer to the introitus does not elicit the same pain  response.  Digital rectal exam is then performed, and it is notable for very  posteriorly-oriented rectosigmoid which rests close the sacral  curvature. It is with some difficulty to find the flexion point.  The pain seems to be  coming from just above the upper limits of the digital exam, and it extends  to the right.  Combined digital rectal and vaginal exam seems to elicit the  pain with the contact in the rectum rather than the vaginal apex which seems  adequately mobile.   IMPRESSION:  1.  Right lower quadrant, lower abdominal, and pelvic pain which has been  essentially unchanged in its symptomatology since at least May of 2006.  2.  Chronic constipation.  3.  Mild cystocele recurrence.   [name] presents a challenging evaluation.  She is very convincing in her  history, but her perception that the pain began with the September surgery  is not consistent with records from Dr. Rito Ehrlich and myself during May 2006.  It is my impression that the pain is related to the area of the lower  rectosigmoid moreso than the vagina itself.  The possibility of post-  surgical changes since her hysterectomy or her bilateral salpingo-  oophorectomy is certainly a possibility.  I would love to continue to follow  the patient with you and would recommend that we evaluate the bowel perhaps  requiring barium enema before considering any last resort surgical  evaluation.  Thank you for allowing me to assist with this most challenging  case.      Tilda Burrow, M.D.  Electronically Signed     JVF/MEDQ  D:  09/09/2005  T:  09/09/2005  Job:  [id number]   cc:   [facility]

## 2011-01-22 NOTE — Discharge Summary (Signed)
NAMEWESLEE, PRESTAGE                  ACCOUNT NO.:  [id number]   MEDICAL RECORD NO.:  [id number]           PATIENT TYPE:   LOCATION:                                FACILITY:  [facility]   PHYSICIAN:  Patrica Duel, M.D.         DATE OF BIRTH:   DATE OF ADMISSION:  12/28/2005  DATE OF DISCHARGE:  05/07/2007LH                                 DISCHARGE SUMMARY   DISCHARGE DIAGNOSES:  1.  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      This was documented by EGD on this admission.  She is improved from      admission, though her status is somewhat tenuous.  2.  Systemic lupus erythematosus.  3.  Pemphigus vulgaris.  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  5.  Depression.  6.  Hypertension, controlled.   ALLERGIES:  1.  SULFA.  2.  THIAZIDES.  3.  DIURETICS.   For details regarding admission, please refer to the admit note.  Briefly  this is a very unfortunate 65 year old female with the above history who has  been admitted multiple times with recurrent abdominal pain.  She responds to  steroids and hydration as well as analgesic therapy.   The patient presented to the office the day of admission complaining of  severe abdominal pain with nausea and vomiting.  She has been seen in the  emergency department and treated symptomatically and sent home.  She had no  hematemesis, hematochezia, melena, fevers, rigors, or chills.  She had  primarily diffuse abdominal pain associated with repetitive vomiting and  poor p.o. intake.  She was admitted with the above complaints.   HOSPITAL COURSE:  The patient's admitting lab was unremarkable except for  glucose of 201.  Hemogram stable.  Amylase, lipase, etc., unremarkable.   The patient was treated symptomatically.  She underwent upper endoscopy  which revealed chronic gastritis which Dr. Benard Rink suspected may be secondary  to vasculitis producing ischemia.  Treatment would be a challenge.   The patient had been treated  symptomatically and is improved.  She has been  offered nursing home placement but has declined at this time.  Currently the  patient is stable taking p.o.'s and able to be discharged.   Note that the patient is chronically depressed and has been seen by mental  health and agrees to continue that on an ongoing basis.   DISPOSITION:  Medications include:  1.  Norvasc 10 daily.  2.  Zoloft 50 daily.  3.  Valtrex 500 daily.  4.  Sucralfate 1 g q.i.d.  5.  Morphine sulfate 60 mg b.i.d.  6.  Omeprazole 20 daily.  7.  Synthroid 125 mcg daily.  8.  Phenergan p.r.n.  9.  Magic Mouthwash p.r.n.  10. Prednisone 20 mg b.i.d. tapering to 30 mg  daily after one week to be      followed closely as an outpatient.      Patrica Duel, M.D.  Electronically Signed     MC/MEDQ  D:  01/10/2006  T:  01/11/2006  Job:  [id number]

## 2011-01-22 NOTE — Op Note (Signed)
NAME:  Enriques, Islah F.                           ACCOUNT NO.:  [id number]   MEDICAL RECORD NO.:  [id number]                  PATIENT TYPE:   LOCATION:                                       FACILITY:   PHYSICIAN:  R. Roetta Sessions, M.D.              DATE OF BIRTH:   DATE OF PROCEDURE:  03/03/2004  DATE OF DISCHARGE:                                 OPERATIVE REPORT   PROCEDURE:  Diagnostic colonoscopy.   ENDOSCOPIST:  Gerrit Friends. Rourk, M.D.   INDICATIONS FOR PROCEDURE:  The patient is a 65 year old lady with multiple  medical problems admitted to the hospital with right-sided abdominal pain.  She has had intermittent small volume hematochezia and has anemia.  Colonoscopy is now being done.  This approach has been discussed with the patient previously and, again,  today at the bedside.  The potential risks, benefits, and alternatives have  been reviewed; questions answered.  Please see the documentation in the  medical record.   PROCEDURE NOTE:  O2 saturation, blood pressure, pulse and respirations were  monitored throughout the entire procedure.  Conscious sedation: Versed 5 mg IV, Demerol 75 mg IV in divided doses.   INSTRUMENT:  Olympus video chip system.   FINDINGS:  A digital rectal exam revealed no abnormalities.   ENDOSCOPIC FINDINGS:  The prep was good.   RECTUM:  Examination of the rectal mucosa including the retroflex view of  the anal verge revealed no abnormalities aside from anal canal hemorrhoids.   COLON:  The colonic mucosa was surveyed from the rectosigmoid junction  through the left transverse and right colon to the area of the appendiceal  orifice, ileocecal valve, and cecum.  These structures were well seen and  photographed for the record.   From this level the scope was slowly withdrawn; and all previously mentioned  mucosal surfaces were again seen.  The colonic mucosa appeared normal.  The  patient tolerated the procedure well and was reacted in  endoscopy.   IMPRESSION:  1. Anal canal hemorrhoids; otherwise normal rectum.  2. Normal colon.   DISCUSSION:  I suspected that the patient has experienced low-volume  hematochezia from hemorrhoids.   I really do not see any significant improvement in this lady's right-sided  abdominal pain since admission to the hospital.   It may well be that she has an element of sphincter of Oddi dysfunction, but  it is unlikely that all of her abdominal symptoms can be explained on that  basis.   RECOMMENDATIONS:  1. A 10-day course of Anusol HC suppositories, 1 per rectum at bedtime.  2. Would consider inpatient tertiary referral back to [facility] where she can be evaluated further and a GI opinion can be     obtained there.      ___________________________________________  Jonathon Bellows, M.D.   RMR/MEDQ  D:  03/03/2004  T:  03/03/2004  Job:  [id number]   cc:   Kirk Ruths, M.D.  [id number]  [place]  [place] [address]  Fax: [phone]   R. Roetta Sessions, M.D.  [id number]  [place]  [place] [address]  Fax: [phone]

## 2011-04-05 ENCOUNTER — Encounter (INDEPENDENT_AMBULATORY_CARE_PROVIDER_SITE_OTHER): Payer: Self-pay

## 2011-04-27 ENCOUNTER — Ambulatory Visit (INDEPENDENT_AMBULATORY_CARE_PROVIDER_SITE_OTHER): Payer: Medicare Other | Admitting: Internal Medicine

## 2011-06-07 LAB — URINALYSIS, ROUTINE W REFLEX MICROSCOPIC
Bilirubin Urine: NEGATIVE
Hgb urine dipstick: NEGATIVE
Specific Gravity, Urine: 1.01
pH: 6.5

## 2011-06-07 LAB — DIFFERENTIAL
Eosinophils Relative: 4
Lymphocytes Relative: 27
Lymphs Abs: 1.1
Monocytes Absolute: 0.3
Monocytes Relative: 6

## 2011-06-07 LAB — CBC
HCT: 33.2 — ABNORMAL LOW
Hemoglobin: 11.3 — ABNORMAL LOW
WBC: 4.1

## 2011-06-07 LAB — BASIC METABOLIC PANEL
GFR calc non Af Amer: 54 — ABNORMAL LOW
Glucose, Bld: 98
Potassium: 4.6
Sodium: 138

## 2011-06-14 ENCOUNTER — Encounter (HOSPITAL_COMMUNITY): Payer: Self-pay | Admitting: Emergency Medicine

## 2011-06-14 ENCOUNTER — Emergency Department (HOSPITAL_COMMUNITY)
Admission: EM | Admit: 2011-06-14 | Discharge: 2011-06-14 | Disposition: A | Discharge status: Home / Self Care | Payer: Medicare Other | Attending: Emergency Medicine | Admitting: Emergency Medicine

## 2011-06-14 ENCOUNTER — Emergency Department (HOSPITAL_COMMUNITY): Payer: Medicare Other

## 2011-06-14 DIAGNOSIS — S63509A Unspecified sprain of unspecified wrist, initial encounter: Secondary | ICD-10-CM | POA: Insufficient documentation

## 2011-06-14 DIAGNOSIS — IMO0002 Reserved for concepts with insufficient information to code with codable children: Secondary | ICD-10-CM | POA: Insufficient documentation

## 2011-06-14 DIAGNOSIS — Z87891 Personal history of nicotine dependence: Secondary | ICD-10-CM | POA: Insufficient documentation

## 2011-06-14 DIAGNOSIS — W19XXXA Unspecified fall, initial encounter: Secondary | ICD-10-CM

## 2011-06-14 DIAGNOSIS — T07XXXA Unspecified multiple injuries, initial encounter: Secondary | ICD-10-CM | POA: Insufficient documentation

## 2011-06-14 DIAGNOSIS — W010XXA Fall on same level from slipping, tripping and stumbling without subsequent striking against object, initial encounter: Secondary | ICD-10-CM | POA: Insufficient documentation

## 2011-06-14 HISTORY — DX: Disorder of thyroid, unspecified: E07.9

## 2011-06-14 MED ORDER — TETANUS-DIPHTH-ACELL PERTUSSIS 5-2.5-18.5 LF-MCG/0.5 IM SUSP
0.5000 mL | Freq: Once | INTRAMUSCULAR | Status: AC
  Administered 2011-06-14: 0.5 mL via INTRAMUSCULAR
  Filled 2011-06-14: qty 0.5

## 2011-06-14 MED ORDER — MORPHINE SULFATE 4 MG/ML IJ SOLN
4.0000 mg | Freq: Once | INTRAMUSCULAR | Status: AC
  Administered 2011-06-14: 4 mg via INTRAVENOUS
  Filled 2011-06-14: qty 1

## 2011-06-14 NOTE — ED Provider Notes (Signed)
History     CSN: [id number] Arrival date & time: 06/14/2011  7:48 AM  Chief Complaint  Patient presents with  . Wrist Injury    (Consider location/radiation/quality/duration/timing/severity/associated sxs/prior treatment) HPI Patient tripped and fell over her treadmill this morning injuring her left wrist and right knee no loss of consciousness was feeling well prior to the event no other in pain is moderate to severe no associated symptoms no treatment prior to coming here Past Medical History  Diagnosis Date  . Other chronic pain 12/16/2010  . Unspecified essential hypertension 12/16/2010  . Other and unspecified hyperlipidemia 12/16/2010  . GERD (gastroesophageal reflux disease)   . Diabetes mellitus   . Thyroid disease     Past Surgical History  Procedure Date  . Cholecystectomy   . Spinal fixation surgery   . Abdominal hysterectomy     History reviewed. No pertinent family history.  History  Substance Use Topics  . Smoking status: Former Games developer  . Smokeless tobacco: Not on file  . Alcohol Use: No    OB History    Grav Para Term Preterm Abortions TAB SAB Ect Mult Living                  Review of Systems  Constitutional: Negative.   HENT: Negative.   Respiratory: Negative.   Cardiovascular: Negative.   Gastrointestinal: Negative.   Musculoskeletal:       Trauma to left wrist and right knee   Skin:       Abrasion right knee  Neurological: Negative.   Hematological: Negative.   Psychiatric/Behavioral: Negative.     Allergies  Review of patient's allergies indicates not on file.  Home Medications   Current Outpatient Rx  Name Route Sig Dispense Refill  . BISOPROLOL FUMARATE 5 MG PO TABS Oral Take 5 mg by mouth daily.      Marland Kitchen LEVOTHYROXINE SODIUM 50 MCG PO TABS Oral Take 50 mcg by mouth daily.      Marland Kitchen METOCLOPRAMIDE HCL 5 MG PO TABS Oral Take 5 mg by mouth 4 (four) times daily.      Marland Kitchen PREGABALIN 75 MG PO CAPS Oral Take 75 mg by mouth 2 (two) times  daily.      Marland Kitchen SIMVASTATIN 20 MG PO TABS Oral Take 20 mg by mouth at bedtime.        BP 142/88  Pulse 80  Temp(Src) 100.3 F (37.9 C) (Oral)  Resp 20  Ht 5' (1.524 m)  Wt 155 lb (70.308 kg)  BMI 30.27 kg/m2  SpO2 99%  Physical Exam  Constitutional: She appears well-developed and well-nourished.       Appears uncomfortable  HENT:  Head: Normocephalic and atraumatic.  Eyes: Conjunctivae are normal. Pupils are equal, round, and reactive to light.  Neck: Neck supple. No tracheal deviation present. No thyromegaly present.  Cardiovascular: Normal rate and regular rhythm.   No murmur heard. Pulmonary/Chest: Effort normal and breath sounds normal.  Abdominal: Soft. Bowel sounds are normal. She exhibits no distension. There is no tenderness.  Musculoskeletal: She exhibits no edema and no tenderness.       Left upper extremity deformity wrist with corresponding tenderness radial pulse 2+ good capillary refill right lower extremity abrasion with tenderness and anterior knee, neurovascular intact  Neurological: She is alert. Coordination normal.  Skin: Skin is warm and dry. No rash noted.  Psychiatric: She has a normal mood and affect.    ED Course  Procedures (including critical care time)  Labs  Reviewed - No data to display No results found.   No diagnosis found.  Pain improved after IV morphine in the emergency department at 12 noon alert and motoric Glasgow Coma Score 15 not lightheaded on standing ambulates without difficulty  MDM  X-rays reviewed by me no fracture  Referral Dr. Hilda Lias   2 comfortable in spelled Velcro splint applied by nurse Plan patient in take hydromorphone as prescribed for her for chronic pain Diagnosis #1 fall #2 sprain of left wrist #3 abrasion right knee   Doug Sou, MD 06/14/11 1209

## 2011-06-14 NOTE — ED Notes (Signed)
Pt tripped and fell. Pt c/o L wrist pain. Obvious deformity and  Very tender to touch. Radial pulse present. Fingers slightly colder than r hand. Cap refill around 2 sec. rom limited. Swelling present.

## 2011-06-14 NOTE — ED Notes (Addendum)
Denies hitting head or LOC. Iced it at time of injury. Has not taken anything for pain.R knee pain also. Pt ambulatory without difficulty

## 2011-06-18 LAB — CBC
HCT: 29.6 — ABNORMAL LOW
Hemoglobin: 10.1 — ABNORMAL LOW
MCHC: 34.2
MCV: 88.9
RBC: 3.33 — ABNORMAL LOW
RDW: 12.6

## 2011-06-18 LAB — URINE MICROSCOPIC-ADD ON

## 2011-06-18 LAB — COMPREHENSIVE METABOLIC PANEL
ALT: 15
BUN: 14
CO2: 30
Calcium: 9.3
GFR calc non Af Amer: >60
Glucose, Bld: 99
Total Protein: 7.2

## 2011-06-18 LAB — URINALYSIS, ROUTINE W REFLEX MICROSCOPIC
Glucose, UA: NEGATIVE
Hgb urine dipstick: NEGATIVE
Protein, ur: NEGATIVE
Specific Gravity, Urine: 1.015
Urobilinogen, UA: 0.2

## 2011-06-18 LAB — DIFFERENTIAL
Basophils Relative: 0
Eosinophils Absolute: 0.2
Lymphs Abs: 1.4
Monocytes Relative: 9
Neutro Abs: 1.8
Neutrophils Relative %: 48

## 2011-09-13 DIAGNOSIS — H2589 Other age-related cataract: Secondary | ICD-10-CM | POA: Diagnosis not present

## 2011-09-13 DIAGNOSIS — E119 Type 2 diabetes mellitus without complications: Secondary | ICD-10-CM | POA: Diagnosis not present

## 2011-09-14 ENCOUNTER — Encounter (HOSPITAL_COMMUNITY): Payer: Self-pay | Admitting: Pharmacy Technician

## 2011-09-15 ENCOUNTER — Encounter (HOSPITAL_COMMUNITY): Payer: Self-pay

## 2011-09-15 ENCOUNTER — Encounter (HOSPITAL_COMMUNITY)
Admission: RE | Admit: 2011-09-15 | Discharge: 2011-09-15 | Disposition: A | Discharge status: Home / Self Care | Payer: Medicare Other | Source: Ambulatory Visit | Attending: Ophthalmology | Admitting: Ophthalmology

## 2011-09-15 ENCOUNTER — Other Ambulatory Visit: Payer: Self-pay

## 2011-09-15 DIAGNOSIS — E119 Type 2 diabetes mellitus without complications: Secondary | ICD-10-CM | POA: Diagnosis not present

## 2011-09-15 DIAGNOSIS — H2589 Other age-related cataract: Secondary | ICD-10-CM | POA: Diagnosis not present

## 2011-09-15 DIAGNOSIS — I10 Essential (primary) hypertension: Secondary | ICD-10-CM | POA: Diagnosis not present

## 2011-09-15 DIAGNOSIS — Z01810 Encounter for preprocedural cardiovascular examination: Secondary | ICD-10-CM | POA: Diagnosis not present

## 2011-09-15 DIAGNOSIS — Z79899 Other long term (current) drug therapy: Secondary | ICD-10-CM | POA: Diagnosis not present

## 2011-09-15 DIAGNOSIS — Z01812 Encounter for preprocedural laboratory examination: Secondary | ICD-10-CM | POA: Diagnosis not present

## 2011-09-15 HISTORY — DX: Fibromyalgia: M79.7

## 2011-09-15 LAB — BASIC METABOLIC PANEL
BUN: 14 mg/dL (ref 6–23)
CO2: 30 mEq/L (ref 19–32)
Calcium: 9.5 mg/dL (ref 8.4–10.5)
Chloride: 106 mEq/L (ref 96–112)
Creatinine, Ser: 1.05 mg/dL (ref 0.50–1.10)

## 2011-09-15 NOTE — Patient Instructions (Addendum)
[address] E[address] BLAS  09/15/2011   Your procedure is scheduled on:  09/20/2011  Report to [facility] at 1300 AM.  Call this number if you have problems the morning of surgery: [phone]   Remember:   Do not eat food:After Midnight.  May have clear liquids:until Midnight .  Clear liquids include soda, tea, black coffee, apple or grape juice, broth.  Take these medicines the morning of surgery with A SIP OF WATER: dilaudid,zebeta,synthroid,reglan,lyrica   Do not wear jewelry, make-up or nail polish.  Do not wear lotions, powders, or perfumes. You may wear deodorant.  Do not shave 48 hours prior to surgery.  Do not bring valuables to the hospital.  Contacts, dentures or bridgework may not be worn into surgery.  Leave suitcase in the car. After surgery it may be brought to your room.  For patients admitted to the hospital, checkout time is 11:00 AM the day of discharge.   Patients discharged the day of surgery will not be allowed to drive home.  Name and phone number of your driver: family  Special Instructions: N/A   Please read over the following fact sheets that you were given: Pain Booklet, Surgical Site Infection Prevention, Anesthesia Post-op Instructions and Care and Recovery After Surgery Cataract A cataract is a clouding of the lens of the eye. It is most often related to aging. A cataract is not a "film" over the surface of the eye. The lens is inside the eye and changes size of the pupil. The lens can enlarge to let more light enter the eye in dark environments and contract the size of the pupil to let in bright light. The lens is the part of the eye that helps focus light on the retina. The retina is the eye's light-sensitive layer. It is in the back of the eye that sends visual signals to the brain. In a normal eye, light passes through the lens and gets focused on the retina. To help produce a sharp image, the lens must remain clear. When a lens becomes cloudy, vision is compromised by  the degree and nature of the clouding. Certain cataracts make people more near-sighted as they develop, others increase glare, and all reduce vision to some degree or another. A cataract that is so dense that it becomes milky white and a white opacity can be seen through the pupil. When the white color is seen, it is called a "mature" or "hyper-mature cataract." Such cataracts cause total blindness in the affected eye. The cataract must be removed to prevent damage to the eye itself. Some types of cataracts can cause a secondary disease of the eye, such as certain types of glaucoma. In the early stages, better lighting and eyeglasses may lessen vision problems caused by cataracts. At a certain point, surgery may be needed to improve vision. CAUSES   Aging. However, cataracts may occur at any age, even in newborns.   Certain drugs.   Trauma to the eye.   Certain diseases (such as diabetes).   Inherited or acquired medical syndromes.  SYMPTOMS   Gradual, progressive drop in vision in the affected eye. Cataracts may develop at different rates in each eye. Cataracts may even be in just one eye with the other unaffected.   Cataracts due to trauma may develop quickly, sometimes over a matter or days or even hours. The result is severe and rapid visual loss.  DIAGNOSIS  To detect a cataract, an eye doctor examines the lens. A well  developed cataract can be diagnosed without dilating the pupil. Early cataracts and others of a specific nature are best diagnosed with an exam of the eyes with the pupils dilated by drops. TREATMENT   For an early cataract, vision may improve by using different eyeglasses or stronger lighting.   If the above measures do not help, surgery is the only effective treatment. This treatment removes the cloudy lens and replaces it with a substitute lens (Intraocular lens, or IOL). Newly developed IOL technology allows the implanted lens to improve vision both at a distance and  up close. Discuss with your eye surgeon about the possibility of still needing glasses. Also discuss how visual coordination between both eyes will be affected.  A cataract needs to be removed only when vision loss interferes with your everyday activities such as driving, reading or watching TV. You and your eye doctor can make that decision together. In most cases, waiting until you are ready to have cataract surgery will not harm your eye. If you have cataracts in both eyes, only one should be removed at a time. This allows the operated eye to heal and be out of danger from serious problems (such as infection or poor wound healing) before having the other eye undergo surgery.  Sometimes, a cataract should be removed even if it does not cause problems with your vision. For example, a cataract should be removed if it prevents examination or treatment of another eye problem. Just as you cannot see out of the affected eye well, your doctor cannot see into your eye well through a cataract. The vast majority of people who have cataract surgery have better vision afterward. CATARACT REMOVAL There are two primary ways to remove a cataract. Your doctor can explain the differences and help determine which is best for you:  Phacoemulsification (small incision cataract surgery). This involves making a small cut (incision) on the edge of the clear, dome-shaped surface that covers the front of the eye (the cornea). An injection behind the eye or eye drops are given to make this a painless procedure. The doctor then inserts a tiny probe into the eye. This device emits ultrasound waves that soften and break up the cloudy center of the lens so it can be removed by suction. Most cataract surgery is done this way. The cuts are usually so small and performed in such a manner that often no sutures are needed to keep it closed.   Extracapsular surgery. Your doctor makes a slightly longer incision on the side of the cornea. The  doctor removes the hard center of the lens. The remainder of the lens is then removed by suction. In some cases, extremely fine sutures are needed which the doctor may, or may not remove in the office after the surgery.  When an IOL is implanted, it needs no care. It becomes a permanent part of your eye and cannot be seen or felt.  Some people cannot have an IOL. They may have problems during surgery, or maybe they have another eye disease. For these people, a soft contact lens may be suggested. If an IOL or contact lens cannot be used, very powerful and thick glasses are required after surgery. Since vision is very different through such thick glasses, it is important to have your doctor discuss the impact on your vision after any cataract surgery where there is no plan to implant an IOL. The normal lens of the eye is covered by a clear capsule. Both phacoemulsification and  extracapsular surgery require that the back surface of this lens capsule be left in place. This helps support IOLs and prevents the IOL from dislocating and falling back into the deeper interior of the eye. Right after surgery, and often permanently this "posterior capsule" remains clear. In some cases however, it can become cloudy, presenting the same type of visual compromise that the original cataract did since light is again obstructed as it passes through the clear IOL. This condition is often referred to as an "after-cataract." Fortunately, after-cataracts are easily treated using a painless and very fast laser treatment that is performed without anesthesia or incisions. It is done in a matter of minutes in an outpatient environment. Visual improvement is often immediate.  HOME CARE INSTRUCTIONS   Your surgeon will discuss pre and post operative care with you prior to surgery. The majority of people are able to do almost all normal activities right away. Although, it is often advised to avoid strenuous activity for a period of time.     Postoperative drops and careful avoidance of infection will be needed. Many surgeons suggest the use of a protective shield during the first few days after surgery.   There is a very small incidence of complication from modern cataract surgery, but it can happen. Infection that spreads to the inside of the eye (endophthalmitis) can result in total visual loss and even loss of the eye itself. In extremely rare instances, the inflammation of endophthalmitis can spread to both eyes (sympathetic ophthalmia). Appropriate post-operative care under the close observation of your surgeon is essential to a successful outcome.  SEEK IMMEDIATE MEDICAL CARE IF:   You have any sudden drop of vision in the operated eye.   You have pain in the operated eye.   You see a large number of floating dots in the field of vision in the operated eye.   You see flashing lights, or if a portion of your side vision in any direction appears black (like a curtain being drawn into your field of vision) in the operated eye.  Document Released: 08/23/2005 Document Revised: 05/05/2011 Document Reviewed: 10/09/2007 [facility] Patient Information 2012 [place], [place].PATIENT INSTRUCTIONS POST-ANESTHESIA  IMMEDIATELY FOLLOWING SURGERY:  Do not drive or operate machinery for the first twenty four hours after surgery.  Do not make any important decisions for twenty four hours after surgery or while taking narcotic pain medications or sedatives.  If you develop intractable nausea and vomiting or a severe headache please notify your doctor immediately.  FOLLOW-UP:  Please make an appointment with your surgeon as instructed. You do not need to follow up with anesthesia unless specifically instructed to do so.  WOUND CARE INSTRUCTIONS (if applicable):  Keep a dry clean dressing on the anesthesia/puncture wound site if there is drainage.  Once the wound has quit draining you may leave it open to air.  Generally you should leave the  bandage intact for twenty four hours unless there is drainage.  If the epidural site drains for more than 36-48 hours please call the anesthesia department.  QUESTIONS?:  Please feel free to call your physician or the hospital operator if you have any questions, and they will be happy to assist you.     [facility] ([place]) Anesthesia Department [address] [place] [phone]

## 2011-09-20 ENCOUNTER — Ambulatory Visit (HOSPITAL_COMMUNITY): Payer: Medicare Other | Admitting: Anesthesiology

## 2011-09-20 ENCOUNTER — Ambulatory Visit (HOSPITAL_COMMUNITY)
Admission: RE | Admit: 2011-09-20 | Discharge: 2011-09-20 | Disposition: A | Discharge status: Home / Self Care | Payer: Medicare Other | Source: Ambulatory Visit | Attending: Ophthalmology | Admitting: Ophthalmology

## 2011-09-20 ENCOUNTER — Encounter (HOSPITAL_COMMUNITY)
Admission: RE | Disposition: A | Discharge status: Home / Self Care | Payer: Self-pay | Source: Ambulatory Visit | Attending: Ophthalmology

## 2011-09-20 ENCOUNTER — Encounter (HOSPITAL_COMMUNITY): Payer: Self-pay | Admitting: *Deleted

## 2011-09-20 ENCOUNTER — Encounter (HOSPITAL_COMMUNITY): Payer: Self-pay | Admitting: Anesthesiology

## 2011-09-20 DIAGNOSIS — I10 Essential (primary) hypertension: Secondary | ICD-10-CM | POA: Diagnosis not present

## 2011-09-20 DIAGNOSIS — Z79899 Other long term (current) drug therapy: Secondary | ICD-10-CM | POA: Insufficient documentation

## 2011-09-20 DIAGNOSIS — E119 Type 2 diabetes mellitus without complications: Secondary | ICD-10-CM | POA: Insufficient documentation

## 2011-09-20 DIAGNOSIS — Z01812 Encounter for preprocedural laboratory examination: Secondary | ICD-10-CM | POA: Diagnosis not present

## 2011-09-20 DIAGNOSIS — Z01810 Encounter for preprocedural cardiovascular examination: Secondary | ICD-10-CM | POA: Diagnosis not present

## 2011-09-20 DIAGNOSIS — H2589 Other age-related cataract: Secondary | ICD-10-CM | POA: Insufficient documentation

## 2011-09-20 DIAGNOSIS — H269 Unspecified cataract: Secondary | ICD-10-CM | POA: Diagnosis not present

## 2011-09-20 HISTORY — PX: CATARACT EXTRACTION W/PHACO: SHX586

## 2011-09-20 SURGERY — PHACOEMULSIFICATION, CATARACT, WITH IOL INSERTION
Anesthesia: Monitor Anesthesia Care | Site: Eye | Laterality: Left | Wound class: Clean

## 2011-09-20 MED ORDER — PROVISC 10 MG/ML IO SOLN
INTRAOCULAR | Status: DC | PRN
  Administered 2011-09-20: 8.5 mg via INTRAOCULAR

## 2011-09-20 MED ORDER — TETRACAINE HCL 0.5 % OP SOLN
1.0000 [drp] | OPHTHALMIC | Status: AC
  Administered 2011-09-20 (×3): 1 [drp] via OPHTHALMIC

## 2011-09-20 MED ORDER — DEXTROSE 50 % IV SOLN
INTRAVENOUS | Status: AC
  Filled 2011-09-20: qty 50

## 2011-09-20 MED ORDER — NEOMYCIN-POLYMYXIN-DEXAMETH 3.5-10000-0.1 OP OINT
TOPICAL_OINTMENT | OPHTHALMIC | Status: AC
  Filled 2011-09-20: qty 3.5

## 2011-09-20 MED ORDER — PHENYLEPHRINE HCL 2.5 % OP SOLN
OPHTHALMIC | Status: AC
  Filled 2011-09-20: qty 2

## 2011-09-20 MED ORDER — LIDOCAINE HCL 3.5 % OP GEL
OPHTHALMIC | Status: DC
Start: 2011-09-20 — End: 2011-09-20
  Filled 2011-09-20: qty 5

## 2011-09-20 MED ORDER — MIDAZOLAM HCL 2 MG/2ML IJ SOLN
INTRAMUSCULAR | Status: AC
  Filled 2011-09-20: qty 2

## 2011-09-20 MED ORDER — CYCLOPENTOLATE-PHENYLEPHRINE 0.2-1 % OP SOLN
OPHTHALMIC | Status: AC
  Filled 2011-09-20: qty 2

## 2011-09-20 MED ORDER — LIDOCAINE HCL (PF) 1 % IJ SOLN
INTRAMUSCULAR | Status: DC | PRN
  Administered 2011-09-20: .4 mL

## 2011-09-20 MED ORDER — LIDOCAINE HCL (PF) 1 % IJ SOLN
INTRAMUSCULAR | Status: AC
  Filled 2011-09-20: qty 2

## 2011-09-20 MED ORDER — LIDOCAINE HCL 3.5 % OP GEL
1.0000 "application " | Freq: Once | OPHTHALMIC | Status: AC
  Administered 2011-09-20: 1 via OPHTHALMIC

## 2011-09-20 MED ORDER — TETRACAINE HCL 0.5 % OP SOLN
OPHTHALMIC | Status: AC
  Administered 2011-09-20: 1 [drp] via OPHTHALMIC
  Filled 2011-09-20: qty 2

## 2011-09-20 MED ORDER — POVIDONE-IODINE 5 % OP SOLN
OPHTHALMIC | Status: DC | PRN
  Administered 2011-09-20: 1 via OPHTHALMIC

## 2011-09-20 MED ORDER — EPINEPHRINE HCL 1 MG/ML IJ SOLN
INTRAOCULAR | Status: DC | PRN
  Administered 2011-09-20: 14:00:00

## 2011-09-20 MED ORDER — DEXTROSE 50 % IV SOLN
12.5000 g | Freq: Once | INTRAVENOUS | Status: AC
  Administered 2011-09-20: 12.5 g via INTRAVENOUS

## 2011-09-20 MED ORDER — EPINEPHRINE HCL 1 MG/ML IJ SOLN
INTRAMUSCULAR | Status: AC
  Filled 2011-09-20: qty 1

## 2011-09-20 MED ORDER — CYCLOPENTOLATE-PHENYLEPHRINE 0.2-1 % OP SOLN
1.0000 [drp] | OPHTHALMIC | Status: AC
  Administered 2011-09-20 (×3): 1 [drp] via OPHTHALMIC

## 2011-09-20 MED ORDER — LIDOCAINE 3.5 % OP GEL OPTIME - NO CHARGE
OPHTHALMIC | Status: DC | PRN
  Administered 2011-09-20: 1 [drp] via OPHTHALMIC

## 2011-09-20 MED ORDER — LACTATED RINGERS IV SOLN
INTRAVENOUS | Status: DC
  Administered 2011-09-20: 14:00:00 via INTRAVENOUS

## 2011-09-20 MED ORDER — NEOMYCIN-POLYMYXIN-DEXAMETH 0.1 % OP OINT
TOPICAL_OINTMENT | OPHTHALMIC | Status: DC | PRN
  Administered 2011-09-20: 1 via OPHTHALMIC

## 2011-09-20 MED ORDER — BSS IO SOLN
INTRAOCULAR | Status: DC | PRN
  Administered 2011-09-20: 15 mL via INTRAOCULAR

## 2011-09-20 MED ORDER — MIDAZOLAM HCL 2 MG/2ML IJ SOLN
1.0000 mg | INTRAMUSCULAR | Status: DC | PRN
  Administered 2011-09-20: 2 mg via INTRAVENOUS

## 2011-09-20 MED ORDER — PHENYLEPHRINE HCL 2.5 % OP SOLN
1.0000 [drp] | OPHTHALMIC | Status: AC
  Administered 2011-09-20 (×3): 1 [drp] via OPHTHALMIC

## 2011-09-20 SURGICAL SUPPLY — 30 items
CAPSULAR TENSION RING-AMO (OPHTHALMIC RELATED) IMPLANT
CLOTH BEACON ORANGE TIMEOUT ST (SAFETY) ×2 IMPLANT
EYE SHIELD UNIVERSAL CLEAR (GAUZE/BANDAGES/DRESSINGS) ×2 IMPLANT
GLOVE BIO SURGEON STRL SZ 6.5 (GLOVE) IMPLANT
GLOVE BIOGEL PI IND STRL 6.5 (GLOVE) ×1 IMPLANT
GLOVE BIOGEL PI IND STRL 7.0 (GLOVE) IMPLANT
GLOVE BIOGEL PI IND STRL 7.5 (GLOVE) IMPLANT
GLOVE BIOGEL PI INDICATOR 6.5 (GLOVE) ×1
GLOVE BIOGEL PI INDICATOR 7.0 (GLOVE)
GLOVE BIOGEL PI INDICATOR 7.5 (GLOVE)
GLOVE ECLIPSE 6.5 STRL STRAW (GLOVE) IMPLANT
GLOVE ECLIPSE 7.0 STRL STRAW (GLOVE) IMPLANT
GLOVE ECLIPSE 7.5 STRL STRAW (GLOVE) IMPLANT
GLOVE EXAM NITRILE LRG STRL (GLOVE) IMPLANT
GLOVE EXAM NITRILE MD LF STRL (GLOVE) ×2 IMPLANT
GLOVE SKINSENSE NS SZ6.5 (GLOVE)
GLOVE SKINSENSE NS SZ7.0 (GLOVE)
GLOVE SKINSENSE STRL SZ6.5 (GLOVE) IMPLANT
GLOVE SKINSENSE STRL SZ7.0 (GLOVE) IMPLANT
KIT VITRECTOMY (OPHTHALMIC RELATED) IMPLANT
PAD ARMBOARD 7.5X6 YLW CONV (MISCELLANEOUS) ×2 IMPLANT
PROC W NO LENS (INTRAOCULAR LENS)
PROC W SPEC LENS (INTRAOCULAR LENS)
PROCESS W NO LENS (INTRAOCULAR LENS) IMPLANT
PROCESS W SPEC LENS (INTRAOCULAR LENS) IMPLANT
RING MALYGIN (MISCELLANEOUS) IMPLANT
SIGHTPATH CAT PROC W REG LENS (Ophthalmic Related) ×2 IMPLANT
SYR TB 1ML LL NO SAFETY (SYRINGE) ×2 IMPLANT
VISCOELASTIC ADDITIONAL (OPHTHALMIC RELATED) IMPLANT
WATER STERILE IRR 250ML POUR (IV SOLUTION) ×2 IMPLANT

## 2011-09-20 NOTE — Anesthesia Postprocedure Evaluation (Signed)
  Anesthesia Post-op Note  Patient: [name]  Procedure(s) Performed:  CATARACT EXTRACTION PHACO AND INTRAOCULAR LENS PLACEMENT (IOC) - CDE:9:14  Patient Location: PACU and Short Stay  Anesthesia Type: MAC  Level of Consciousness: awake, alert , oriented and patient cooperative  Airway and Oxygen Therapy: Patient Spontanous Breathing  Post-op Pain: none  Post-op Assessment: Post-op Vital signs reviewed, Patient's Cardiovascular Status Stable, Respiratory Function Stable, Patent Airway and No signs of Nausea or vomiting  Post-op Vital Signs: Reviewed and stable  Complications: No apparent anesthesia complications

## 2011-09-20 NOTE — Transfer of Care (Signed)
Immediate Anesthesia Transfer of Care Note  Patient: [name]  Procedure(s) Performed:  CATARACT EXTRACTION PHACO AND INTRAOCULAR LENS PLACEMENT (IOC) - CDE:9:14  Patient Location: PACU and Short Stay  Anesthesia Type: MAC  Level of Consciousness: awake, alert , oriented and patient cooperative  Airway & Oxygen Therapy: Patient Spontanous Breathing  Post-op Assessment: Report given to PACU RN, Post -op Vital signs reviewed and stable and Patient moving all extremities  Post vital signs: Reviewed and stable Filed Vitals:   09/20/11 1408  BP: 142/73  Pulse: 63  Temp:   Resp: 18    Complications: No apparent anesthesia complications

## 2011-09-20 NOTE — H&P (Signed)
I have reviewed the H&P, the patient was re-examined, and I have identified no interval changes in medical condition and plan of care since the history and physical of record  

## 2011-09-20 NOTE — Brief Op Note (Signed)
Pre-Op Dx: Cataract OS Post-Op Dx: Cataract OS Surgeon: Mikeya Tomasetti Anesthesia: Topical with MAC Implant: B&L enVista Specimen: None Complications: None 

## 2011-09-20 NOTE — Anesthesia Preprocedure Evaluation (Addendum)
Anesthesia Evaluation  Patient identified by MRN, date of birth, ID band Patient awake    Airway Mallampati: I      Dental  (+) Edentulous Upper and Edentulous Lower   Pulmonary neg pulmonary ROS,  clear to auscultation        Cardiovascular hypertension, Pt. on medications Regular Normal    Neuro/Psych  Neuromuscular disease    GI/Hepatic GERD-  Medicated and Controlled,  Endo/Other  Diabetes mellitus-, Well Controlled, Type 2  Renal/GU      Musculoskeletal  (+) Fibromyalgia -  Abdominal   Peds  Hematology   Anesthesia Other Findings   Reproductive/Obstetrics                           Anesthesia Physical Anesthesia Plan  ASA: III  Anesthesia Plan: MAC   Post-op Pain Management:    Induction: Intravenous  Airway Management Planned: Nasal Cannula  Additional Equipment:   Intra-op Plan:   Post-operative Plan:   Informed Consent: I have reviewed the patients History and Physical, chart, labs and discussed the procedure including the risks, benefits and alternatives for the proposed anesthesia with the patient or authorized representative who has indicated his/her understanding and acceptance.     Plan Discussed with:   Anesthesia Plan Comments:         Anesthesia Quick Evaluation

## 2011-09-21 NOTE — Op Note (Signed)
[name], [name]                  ACCOUNT NO.:  [id number]  MEDICAL RECORD NO.:  [id number]  LOCATION:  [facility]PO                          FACILITY:  [facility]  PHYSICIAN:  Susanne Greenhouse, MD       DATE OF BIRTH:  1946/08/01  DATE OF PROCEDURE:  09/20/2011 DATE OF DISCHARGE:  09/20/2011                              OPERATIVE REPORT   PREOPERATIVE DIAGNOSIS:  Combined cataract, left eye, diagnosis code 366.19.  POSTOPERATIVE DIAGNOSIS:  Combined cataract, left eye, diagnosis code 366.19.  OPERATION PERFORMED:  Phacoemulsification with posterior chamber intraocular lens implantation, left eye.  SURGEON:  Bonne Dolores. Parmvir Boomer, MD  ANESTHESIA:  General endotracheal anesthesia.  OPERATIVE SUMMARY:  In the preoperative area, dilating drops were placed into the left eye.  The patient was then brought into the operating room where he was placed under general anesthesia.  The eye was then prepped and draped.  Beginning with a 75 blade, a paracentesis port was made at the surgeon's 2 o'clock position.  The anterior chamber was then filled with a 1% nonpreserved lidocaine solution with epinephrine.  This was followed by Viscoat to deepen the chamber.  A small fornix-based peritomy was performed superiorly.  Next, a single iris hook was placed through the limbus superiorly.  A 2.4-mm keratome blade was then used to make a clear corneal incision over the iris hook.  A bent cystotome needle and Utrata forceps were used to create a continuous tear capsulotomy.  Hydrodissection was performed using balanced salt solution on a fine cannula.  The lens nucleus was then removed using phacoemulsification in a quadrant cracking technique.  The cortical material was then removed with irrigation and aspiration.  The capsular bag and anterior chamber were refilled with Provisc.  The wound was widened to approximately 3 mm and a posterior chamber intraocular lens was placed into the capsular bag without difficulty using  an [employer] lens injecting system.  A single 10-0 nylon suture was then used to close the incision as well as stromal hydration.  The Provisc was removed from the anterior chamber and capsular bag with irrigation and aspiration.  At this point, the wounds were tested for leak, which were negative.  The anterior chamber remained deep and stable.  The patient tolerated the procedure well.  There were no operative complications, and he awoke from general anesthesia without problem.  No surgical specimens.  The patient's surgery was performed on a regular hospital stretcher due to the patient's weight being too heavy for a eye bed and this made positioning and comfort very difficult for the surgeon.  There were no complications.  No surgical specimens. Prosthetic device used is a [employer]in [employer] posterior chamber lens, model MX60, power of 19.0, serial number is [id number].          ______________________________ Susanne Greenhouse, MD     KEH/MEDQ  D:  09/20/2011  T:  09/21/2011  Job:  [id number]

## 2011-09-22 ENCOUNTER — Encounter (HOSPITAL_COMMUNITY): Payer: Self-pay | Admitting: Ophthalmology

## 2011-09-27 DIAGNOSIS — M329 Systemic lupus erythematosus, unspecified: Secondary | ICD-10-CM | POA: Diagnosis not present

## 2011-09-27 DIAGNOSIS — R42 Dizziness and giddiness: Secondary | ICD-10-CM | POA: Diagnosis not present

## 2011-09-27 DIAGNOSIS — Z6828 Body mass index (BMI) 28.0-28.9, adult: Secondary | ICD-10-CM | POA: Diagnosis not present

## 2011-09-27 DIAGNOSIS — H2589 Other age-related cataract: Secondary | ICD-10-CM | POA: Diagnosis not present

## 2011-09-28 ENCOUNTER — Encounter (HOSPITAL_COMMUNITY): Payer: Self-pay | Admitting: Pharmacy Technician

## 2011-09-29 ENCOUNTER — Encounter (HOSPITAL_COMMUNITY)
Admission: RE | Admit: 2011-09-29 | Discharge: 2011-09-29 | Payer: Medicare Other | Source: Ambulatory Visit | Admitting: Ophthalmology

## 2011-09-29 MED ORDER — FENTANYL CITRATE 0.05 MG/ML IJ SOLN: 25.0000 ug | INTRAMUSCULAR | Status: DC | PRN

## 2011-09-29 MED ORDER — ONDANSETRON HCL 4 MG/2ML IJ SOLN: 4.0000 mg | Freq: Once | INTRAMUSCULAR | Status: AC | PRN

## 2011-10-01 MED ORDER — CYCLOPENTOLATE-PHENYLEPHRINE 0.2-1 % OP SOLN
OPHTHALMIC | Status: AC
  Filled 2011-10-01: qty 2

## 2011-10-01 MED ORDER — NEOMYCIN-POLYMYXIN-DEXAMETH 3.5-10000-0.1 OP OINT
TOPICAL_OINTMENT | OPHTHALMIC | Status: AC
  Filled 2011-10-01: qty 3.5

## 2011-10-01 MED ORDER — LIDOCAINE HCL 3.5 % OP GEL
OPHTHALMIC | Status: AC
  Filled 2011-10-01: qty 5

## 2011-10-01 MED ORDER — LIDOCAINE HCL (PF) 1 % IJ SOLN
INTRAMUSCULAR | Status: AC
  Filled 2011-10-01: qty 2

## 2011-10-01 MED ORDER — TETRACAINE HCL 0.5 % OP SOLN
OPHTHALMIC | Status: AC
  Filled 2011-10-01: qty 2

## 2011-10-04 ENCOUNTER — Encounter (HOSPITAL_COMMUNITY): Payer: Self-pay | Admitting: *Deleted

## 2011-10-04 ENCOUNTER — Encounter (HOSPITAL_COMMUNITY): Payer: Self-pay | Admitting: Anesthesiology

## 2011-10-04 ENCOUNTER — Ambulatory Visit (HOSPITAL_COMMUNITY)
Admission: RE | Admit: 2011-10-04 | Discharge: 2011-10-04 | Disposition: A | Discharge status: Home / Self Care | Payer: Medicare Other | Source: Ambulatory Visit | Attending: Ophthalmology | Admitting: Ophthalmology

## 2011-10-04 ENCOUNTER — Ambulatory Visit (HOSPITAL_COMMUNITY): Payer: Medicare Other | Admitting: Anesthesiology

## 2011-10-04 ENCOUNTER — Encounter (HOSPITAL_COMMUNITY)
Admission: RE | Disposition: A | Discharge status: Home / Self Care | Payer: Self-pay | Source: Ambulatory Visit | Attending: Ophthalmology

## 2011-10-04 DIAGNOSIS — I10 Essential (primary) hypertension: Secondary | ICD-10-CM | POA: Insufficient documentation

## 2011-10-04 DIAGNOSIS — Z01812 Encounter for preprocedural laboratory examination: Secondary | ICD-10-CM | POA: Diagnosis not present

## 2011-10-04 DIAGNOSIS — E119 Type 2 diabetes mellitus without complications: Secondary | ICD-10-CM | POA: Diagnosis not present

## 2011-10-04 DIAGNOSIS — H2589 Other age-related cataract: Secondary | ICD-10-CM | POA: Insufficient documentation

## 2011-10-04 DIAGNOSIS — H269 Unspecified cataract: Secondary | ICD-10-CM | POA: Diagnosis not present

## 2011-10-04 DIAGNOSIS — Z79899 Other long term (current) drug therapy: Secondary | ICD-10-CM | POA: Insufficient documentation

## 2011-10-04 HISTORY — PX: CATARACT EXTRACTION W/PHACO: SHX586

## 2011-10-04 LAB — GLUCOSE, CAPILLARY: Glucose-Capillary: 94 mg/dL (ref 70–99)

## 2011-10-04 SURGERY — PHACOEMULSIFICATION, CATARACT, WITH IOL INSERTION
Anesthesia: Monitor Anesthesia Care | Site: Eye | Laterality: Right | Wound class: Clean

## 2011-10-04 MED ORDER — LIDOCAINE 3.5 % OP GEL OPTIME - NO CHARGE
OPHTHALMIC | Status: DC | PRN
  Administered 2011-10-04: 1 [drp] via OPHTHALMIC

## 2011-10-04 MED ORDER — MIDAZOLAM HCL 2 MG/2ML IJ SOLN
1.0000 mg | INTRAMUSCULAR | Status: DC | PRN
  Administered 2011-10-04: 2 mg via INTRAVENOUS

## 2011-10-04 MED ORDER — CYCLOPENTOLATE-PHENYLEPHRINE 0.2-1 % OP SOLN
1.0000 [drp] | OPHTHALMIC | Status: AC
  Administered 2011-10-04 (×3): 1 [drp] via OPHTHALMIC

## 2011-10-04 MED ORDER — PHENYLEPHRINE HCL 2.5 % OP SOLN
1.0000 [drp] | OPHTHALMIC | Status: AC
Start: 2011-10-04 — End: 2011-10-04
  Administered 2011-10-04 (×3): 1 [drp] via OPHTHALMIC

## 2011-10-04 MED ORDER — POVIDONE-IODINE 5 % OP SOLN
OPHTHALMIC | Status: DC | PRN
  Administered 2011-10-04: 1 via OPHTHALMIC

## 2011-10-04 MED ORDER — LIDOCAINE HCL (PF) 1 % IJ SOLN
INTRAMUSCULAR | Status: DC | PRN
  Administered 2011-10-04: .4 mL

## 2011-10-04 MED ORDER — EPINEPHRINE HCL 1 MG/ML IJ SOLN
INTRAOCULAR | Status: DC | PRN
  Administered 2011-10-04: 10:00:00

## 2011-10-04 MED ORDER — BSS IO SOLN
INTRAOCULAR | Status: DC | PRN
  Administered 2011-10-04: 30 mL via INTRAOCULAR

## 2011-10-04 MED ORDER — LACTATED RINGERS IV SOLN
INTRAVENOUS | Status: DC
  Administered 2011-10-04: 10:00:00 via INTRAVENOUS

## 2011-10-04 MED ORDER — EPINEPHRINE HCL 1 MG/ML IJ SOLN
INTRAMUSCULAR | Status: AC
  Filled 2011-10-04: qty 1

## 2011-10-04 MED ORDER — MIDAZOLAM HCL 2 MG/2ML IJ SOLN
INTRAMUSCULAR | Status: AC
  Administered 2011-10-04: 2 mg via INTRAVENOUS
  Filled 2011-10-04: qty 2

## 2011-10-04 MED ORDER — NEOMYCIN-POLYMYXIN-DEXAMETH 0.1 % OP OINT
TOPICAL_OINTMENT | OPHTHALMIC | Status: DC | PRN
  Administered 2011-10-04: 1 via OPHTHALMIC

## 2011-10-04 MED ORDER — LIDOCAINE HCL 3.5 % OP GEL
1.0000 "application " | Freq: Once | OPHTHALMIC | Status: AC
  Administered 2011-10-04: 1 via OPHTHALMIC

## 2011-10-04 MED ORDER — PHENYLEPHRINE HCL 2.5 % OP SOLN
OPHTHALMIC | Status: AC
  Administered 2011-10-04: 1 [drp] via OPHTHALMIC
  Filled 2011-10-04: qty 2

## 2011-10-04 MED ORDER — TETRACAINE HCL 0.5 % OP SOLN
1.0000 [drp] | OPHTHALMIC | Status: AC
Start: 2011-10-04 — End: 2011-10-04
  Administered 2011-10-04 (×3): 1 [drp] via OPHTHALMIC

## 2011-10-04 MED ORDER — PROVISC 10 MG/ML IO SOLN
INTRAOCULAR | Status: DC | PRN
  Administered 2011-10-04: .85 mL via INTRAOCULAR

## 2011-10-04 SURGICAL SUPPLY — 31 items
CAPSULAR TENSION RING-AMO (OPHTHALMIC RELATED) IMPLANT
CLOTH BEACON ORANGE TIMEOUT ST (SAFETY) ×2 IMPLANT
EYE SHIELD UNIVERSAL CLEAR (GAUZE/BANDAGES/DRESSINGS) ×2 IMPLANT
GLOVE BIO SURGEON STRL SZ 6.5 (GLOVE) IMPLANT
GLOVE BIOGEL PI IND STRL 6.5 (GLOVE) IMPLANT
GLOVE BIOGEL PI IND STRL 7.0 (GLOVE) ×1 IMPLANT
GLOVE BIOGEL PI IND STRL 7.5 (GLOVE) IMPLANT
GLOVE BIOGEL PI INDICATOR 6.5 (GLOVE)
GLOVE BIOGEL PI INDICATOR 7.0 (GLOVE) ×1
GLOVE BIOGEL PI INDICATOR 7.5 (GLOVE)
GLOVE ECLIPSE 6.5 STRL STRAW (GLOVE) IMPLANT
GLOVE ECLIPSE 7.0 STRL STRAW (GLOVE) IMPLANT
GLOVE ECLIPSE 7.5 STRL STRAW (GLOVE) IMPLANT
GLOVE EXAM NITRILE LRG STRL (GLOVE) IMPLANT
GLOVE EXAM NITRILE MD LF STRL (GLOVE) ×2 IMPLANT
GLOVE SKINSENSE NS SZ6.5 (GLOVE)
GLOVE SKINSENSE NS SZ7.0 (GLOVE)
GLOVE SKINSENSE STRL SZ6.5 (GLOVE) IMPLANT
GLOVE SKINSENSE STRL SZ7.0 (GLOVE) IMPLANT
KIT VITRECTOMY (OPHTHALMIC RELATED) IMPLANT
PAD ARMBOARD 7.5X6 YLW CONV (MISCELLANEOUS) ×2 IMPLANT
PROC W NO LENS (INTRAOCULAR LENS)
PROC W SPEC LENS (INTRAOCULAR LENS)
PROCESS W NO LENS (INTRAOCULAR LENS) IMPLANT
PROCESS W SPEC LENS (INTRAOCULAR LENS) IMPLANT
RING MALYGIN (MISCELLANEOUS) IMPLANT
SIGHTPATH CAT PROC W REG LENS (Ophthalmic Related) ×2 IMPLANT
SYR TB 1ML LL NO SAFETY (SYRINGE) ×2 IMPLANT
TAPE CLOTH 1X10 TAN NS (GAUZE/BANDAGES/DRESSINGS) ×2 IMPLANT
VISCOELASTIC ADDITIONAL (OPHTHALMIC RELATED) IMPLANT
WATER STERILE IRR 250ML POUR (IV SOLUTION) ×2 IMPLANT

## 2011-10-04 NOTE — Anesthesia Postprocedure Evaluation (Signed)
  Anesthesia Post-op Note  Patient: [name]  Procedure(s) Performed:  CATARACT EXTRACTION PHACO AND INTRAOCULAR LENS PLACEMENT (IOC) - CDE:11.66  Patient Location: PACU  Anesthesia Type: MAC  Level of Consciousness: awake, alert  and oriented  Airway and Oxygen Therapy: Patient Spontanous Breathing  Post-op Pain: none  Post-op Assessment: Post-op Vital signs reviewed, Patient's Cardiovascular Status Stable, Respiratory Function Stable and No signs of Nausea or vomiting  Post-op Vital Signs: Reviewed and stable  Complications: No apparent anesthesia complications

## 2011-10-04 NOTE — H&P (Signed)
I have reviewed the H&P, the patient was re-examined, and I have identified no interval changes in medical condition and plan of care since the history and physical of record  

## 2011-10-04 NOTE — Transfer of Care (Signed)
Immediate Anesthesia Transfer of Care Note  Patient: [name]  Procedure(s) Performed:  CATARACT EXTRACTION PHACO AND INTRAOCULAR LENS PLACEMENT (IOC) - CDE:11.66  Patient Location: PACU  Anesthesia Type: MAC  Level of Consciousness: awake, alert  and oriented  Airway & Oxygen Therapy: Patient Spontanous Breathing  Post-op Assessment: Report given to PACU RN  Post vital signs: Reviewed and stable  Complications: No apparent anesthesia complications

## 2011-10-04 NOTE — Progress Notes (Signed)
Pt is having [facility] transport her after her cataract surgery back home. Pt has no family present here during or after her surgery. Post-op instructions were reviewed with pt and printed copy of all instructions were given to her. Pt said she lives with her son, Casimiro Needle, and she gave me his phone number to call and review the instructions with also, but no one answered. I spoke with my charge nurse, Waynard Edwards, RN, and she said it was okay for the pt to be given her own discharge instructions and to be given a printed copy and to sign them with [facility] transporting her back home.

## 2011-10-04 NOTE — Brief Op Note (Signed)
Pre-Op Dx: Cataract OD Post-Op Dx: Cataract OD Surgeon: Atonya Templer Anesthesia: Topical with MAC Implant: B&L enVista Blood Loss: None Specimen: None Complications: None 

## 2011-10-04 NOTE — Anesthesia Preprocedure Evaluation (Signed)
Anesthesia Evaluation  Patient identified by MRN, date of birth, ID band Patient awake    Reviewed: Allergy & Precautions, H&P , NPO status , Patient's Chart, lab work & pertinent test results  Airway Mallampati: I      Dental  (+) Edentulous Upper and Edentulous Lower   Pulmonary neg pulmonary ROS,  clear to auscultation        Cardiovascular hypertension, Pt. on medications Regular Normal    Neuro/Psych  Neuromuscular disease    GI/Hepatic GERD-  Medicated and Controlled,  Endo/Other  Diabetes mellitus-, Well Controlled, Type 2  Renal/GU      Musculoskeletal  (+) Fibromyalgia -  Abdominal   Peds  Hematology   Anesthesia Other Findings   Reproductive/Obstetrics                           Anesthesia Physical Anesthesia Plan  ASA: III  Anesthesia Plan: MAC   Post-op Pain Management:    Induction: Intravenous  Airway Management Planned: Nasal Cannula  Additional Equipment:   Intra-op Plan:   Post-operative Plan:   Informed Consent: I have reviewed the patients History and Physical, chart, labs and discussed the procedure including the risks, benefits and alternatives for the proposed anesthesia with the patient or authorized representative who has indicated his/her understanding and acceptance.     Plan Discussed with:   Anesthesia Plan Comments:         Anesthesia Quick Evaluation

## 2011-10-05 NOTE — Op Note (Signed)
[name], [name]                  ACCOUNT NO.:  [id number]  MEDICAL RECORD NO.:  [id number]  LOCATION:  [facility]                          FACILITY:  [facility]  PHYSICIAN:  Susanne Greenhouse, MD       DATE OF BIRTH:  August 10, 1946  DATE OF PROCEDURE:  10/04/2011 DATE OF DISCHARGE:  10/04/2011                              OPERATIVE REPORT   PREOPERATIVE DIAGNOSIS:  Combined cataract, right eye, diagnosis code 366.19.  POSTOPERATIVE DIAGNOSIS:  Combined cataract, right eye, diagnosis code 366.19.  PROCEDURE:  Phacoemulsification with posterior chamber intraocular lens implantation, right eye.  SURGEON:  Bonne Dolores. Mort Smelser, MD  ANESTHESIA:  General endotracheal anesthesia.  OPERATIVE SUMMARY:  In the preoperative area, dilating drops were placed into the right eye.  The patient was then brought into the operating room where he was placed under general anesthesia.  The eye was then prepped and draped.  Beginning with a 75 blade, a paracentesis port was made at the surgeon's 2 o'clock position.  The anterior chamber was then filled with a 1% nonpreserved lidocaine solution with epinephrine.  This was followed by Viscoat to deepen the chamber.  A small fornix-based peritomy was performed superiorly. Next, a single iris hook was placed through the limbus superiorly.  A 2.4-mm  keratome blade was then used to make a clear corneal incision over the iris hook.  A bent cystotome needle and Utrata forceps were used to create a continuous tear capsulotomy.  Hydrodissection was performed using balanced salt solution on a fine cannula.  The lens nucleus was then removed using phacoemulsification in a quadrant cracking technique.  The cortical material was then removed with irrigation and aspiration.  The capsular bag and anterior chamber were refilled with Provisc.  The wound was widened to approximately 3 mm and a posterior chamber intraocular lens was placed into the capsular bag without difficulty using an  [employer] lens injecting system.  A single 10-0 nylon suture was then used to close the incision as well as stromal hydration.  The Provisc was removed from the anterior chamber and capsular bag with irrigation and aspiration.  At this point, the wounds were tested for leak, which were negative.  The anterior chamber remained deep and stable.  The patient tolerated the procedure well.  There were no operative complications, and he awoke from general anesthesia without problem.  No surgical specimens.  Prosthetic device used is a [employer] enVista posterior chamber lens model MX60, power of 18.5, serial number is [id number].          ______________________________ Susanne Greenhouse, MD     KEH/MEDQ  D:  10/04/2011  T:  10/05/2011  Job:  [id number]

## 2011-10-07 ENCOUNTER — Encounter (HOSPITAL_COMMUNITY): Payer: Self-pay | Admitting: Ophthalmology

## 2011-10-27 DIAGNOSIS — Z79899 Other long term (current) drug therapy: Secondary | ICD-10-CM | POA: Diagnosis not present

## 2011-10-27 DIAGNOSIS — E785 Hyperlipidemia, unspecified: Secondary | ICD-10-CM | POA: Diagnosis not present

## 2011-10-27 DIAGNOSIS — E039 Hypothyroidism, unspecified: Secondary | ICD-10-CM | POA: Diagnosis not present

## 2011-10-27 DIAGNOSIS — K219 Gastro-esophageal reflux disease without esophagitis: Secondary | ICD-10-CM | POA: Diagnosis not present

## 2011-10-27 DIAGNOSIS — I10 Essential (primary) hypertension: Secondary | ICD-10-CM | POA: Diagnosis not present

## 2011-11-11 DIAGNOSIS — Z6828 Body mass index (BMI) 28.0-28.9, adult: Secondary | ICD-10-CM | POA: Diagnosis not present

## 2011-11-11 DIAGNOSIS — R300 Dysuria: Secondary | ICD-10-CM | POA: Diagnosis not present

## 2011-11-11 DIAGNOSIS — N390 Urinary tract infection, site not specified: Secondary | ICD-10-CM | POA: Diagnosis not present

## 2011-11-25 DIAGNOSIS — I10 Essential (primary) hypertension: Secondary | ICD-10-CM | POA: Diagnosis not present

## 2011-11-25 DIAGNOSIS — J209 Acute bronchitis, unspecified: Secondary | ICD-10-CM | POA: Diagnosis not present

## 2011-11-25 DIAGNOSIS — J9801 Acute bronchospasm: Secondary | ICD-10-CM | POA: Diagnosis not present

## 2011-11-25 DIAGNOSIS — G8929 Other chronic pain: Secondary | ICD-10-CM | POA: Diagnosis not present

## 2011-11-25 DIAGNOSIS — Z6829 Body mass index (BMI) 29.0-29.9, adult: Secondary | ICD-10-CM | POA: Diagnosis not present

## 2011-12-15 DIAGNOSIS — M25529 Pain in unspecified elbow: Secondary | ICD-10-CM | POA: Diagnosis not present

## 2011-12-15 DIAGNOSIS — M25539 Pain in unspecified wrist: Secondary | ICD-10-CM | POA: Diagnosis not present

## 2011-12-28 DIAGNOSIS — E785 Hyperlipidemia, unspecified: Secondary | ICD-10-CM | POA: Diagnosis not present

## 2011-12-28 DIAGNOSIS — Z6828 Body mass index (BMI) 28.0-28.9, adult: Secondary | ICD-10-CM | POA: Diagnosis not present

## 2011-12-28 DIAGNOSIS — I10 Essential (primary) hypertension: Secondary | ICD-10-CM | POA: Diagnosis not present

## 2011-12-28 DIAGNOSIS — E039 Hypothyroidism, unspecified: Secondary | ICD-10-CM | POA: Diagnosis not present

## 2011-12-28 DIAGNOSIS — G8929 Other chronic pain: Secondary | ICD-10-CM | POA: Diagnosis not present

## 2011-12-28 DIAGNOSIS — K219 Gastro-esophageal reflux disease without esophagitis: Secondary | ICD-10-CM | POA: Diagnosis not present

## 2012-01-27 DIAGNOSIS — E785 Hyperlipidemia, unspecified: Secondary | ICD-10-CM | POA: Diagnosis not present

## 2012-01-27 DIAGNOSIS — E039 Hypothyroidism, unspecified: Secondary | ICD-10-CM | POA: Diagnosis not present

## 2012-01-27 DIAGNOSIS — K219 Gastro-esophageal reflux disease without esophagitis: Secondary | ICD-10-CM | POA: Diagnosis not present

## 2012-01-27 DIAGNOSIS — G8929 Other chronic pain: Secondary | ICD-10-CM | POA: Diagnosis not present

## 2012-02-28 DIAGNOSIS — G8929 Other chronic pain: Secondary | ICD-10-CM | POA: Diagnosis not present

## 2012-02-28 DIAGNOSIS — Z6829 Body mass index (BMI) 29.0-29.9, adult: Secondary | ICD-10-CM | POA: Diagnosis not present

## 2012-03-31 DIAGNOSIS — G8929 Other chronic pain: Secondary | ICD-10-CM | POA: Diagnosis not present

## 2012-03-31 DIAGNOSIS — Z6828 Body mass index (BMI) 28.0-28.9, adult: Secondary | ICD-10-CM | POA: Diagnosis not present

## 2012-03-31 DIAGNOSIS — R5381 Other malaise: Secondary | ICD-10-CM | POA: Diagnosis not present

## 2012-03-31 DIAGNOSIS — M159 Polyosteoarthritis, unspecified: Secondary | ICD-10-CM | POA: Diagnosis not present

## 2012-03-31 DIAGNOSIS — E785 Hyperlipidemia, unspecified: Secondary | ICD-10-CM | POA: Diagnosis not present

## 2012-05-05 DIAGNOSIS — E039 Hypothyroidism, unspecified: Secondary | ICD-10-CM | POA: Diagnosis not present

## 2012-05-05 DIAGNOSIS — E785 Hyperlipidemia, unspecified: Secondary | ICD-10-CM | POA: Diagnosis not present

## 2012-05-05 DIAGNOSIS — G8929 Other chronic pain: Secondary | ICD-10-CM | POA: Diagnosis not present

## 2012-05-05 DIAGNOSIS — I10 Essential (primary) hypertension: Secondary | ICD-10-CM | POA: Diagnosis not present

## 2012-06-06 DIAGNOSIS — G8929 Other chronic pain: Secondary | ICD-10-CM | POA: Diagnosis not present

## 2012-06-06 DIAGNOSIS — N390 Urinary tract infection, site not specified: Secondary | ICD-10-CM | POA: Diagnosis not present

## 2012-06-06 DIAGNOSIS — Z6829 Body mass index (BMI) 29.0-29.9, adult: Secondary | ICD-10-CM | POA: Diagnosis not present

## 2012-07-13 DIAGNOSIS — Z6829 Body mass index (BMI) 29.0-29.9, adult: Secondary | ICD-10-CM | POA: Diagnosis not present

## 2012-07-13 DIAGNOSIS — E039 Hypothyroidism, unspecified: Secondary | ICD-10-CM | POA: Diagnosis not present

## 2012-07-13 DIAGNOSIS — I10 Essential (primary) hypertension: Secondary | ICD-10-CM | POA: Diagnosis not present

## 2012-07-13 DIAGNOSIS — N390 Urinary tract infection, site not specified: Secondary | ICD-10-CM | POA: Diagnosis not present

## 2012-07-13 DIAGNOSIS — E785 Hyperlipidemia, unspecified: Secondary | ICD-10-CM | POA: Diagnosis not present

## 2012-08-21 DIAGNOSIS — E039 Hypothyroidism, unspecified: Secondary | ICD-10-CM | POA: Diagnosis not present

## 2012-08-21 DIAGNOSIS — I10 Essential (primary) hypertension: Secondary | ICD-10-CM | POA: Diagnosis not present

## 2012-08-21 DIAGNOSIS — Z6830 Body mass index (BMI) 30.0-30.9, adult: Secondary | ICD-10-CM | POA: Diagnosis not present

## 2012-08-21 DIAGNOSIS — G8929 Other chronic pain: Secondary | ICD-10-CM | POA: Diagnosis not present

## 2012-09-29 DIAGNOSIS — G8929 Other chronic pain: Secondary | ICD-10-CM | POA: Diagnosis not present

## 2012-09-29 DIAGNOSIS — N390 Urinary tract infection, site not specified: Secondary | ICD-10-CM | POA: Diagnosis not present

## 2012-09-29 DIAGNOSIS — Z6829 Body mass index (BMI) 29.0-29.9, adult: Secondary | ICD-10-CM | POA: Diagnosis not present

## 2012-09-29 DIAGNOSIS — K219 Gastro-esophageal reflux disease without esophagitis: Secondary | ICD-10-CM | POA: Diagnosis not present

## 2012-10-01 ENCOUNTER — Emergency Department (HOSPITAL_COMMUNITY): Payer: Medicare Other

## 2012-10-01 ENCOUNTER — Emergency Department (HOSPITAL_COMMUNITY)
Admission: EM | Admit: 2012-10-01 | Discharge: 2012-10-01 | Disposition: A | Discharge status: Home / Self Care | Payer: Medicare Other | Attending: Emergency Medicine | Admitting: Emergency Medicine

## 2012-10-01 ENCOUNTER — Encounter (HOSPITAL_COMMUNITY): Payer: Self-pay

## 2012-10-01 DIAGNOSIS — Z79899 Other long term (current) drug therapy: Secondary | ICD-10-CM | POA: Diagnosis not present

## 2012-10-01 DIAGNOSIS — E079 Disorder of thyroid, unspecified: Secondary | ICD-10-CM | POA: Insufficient documentation

## 2012-10-01 DIAGNOSIS — Z87891 Personal history of nicotine dependence: Secondary | ICD-10-CM | POA: Insufficient documentation

## 2012-10-01 DIAGNOSIS — R0602 Shortness of breath: Secondary | ICD-10-CM | POA: Insufficient documentation

## 2012-10-01 DIAGNOSIS — E785 Hyperlipidemia, unspecified: Secondary | ICD-10-CM | POA: Insufficient documentation

## 2012-10-01 DIAGNOSIS — R52 Pain, unspecified: Secondary | ICD-10-CM | POA: Insufficient documentation

## 2012-10-01 DIAGNOSIS — G8929 Other chronic pain: Secondary | ICD-10-CM | POA: Diagnosis not present

## 2012-10-01 DIAGNOSIS — M797 Fibromyalgia: Secondary | ICD-10-CM

## 2012-10-01 DIAGNOSIS — I10 Essential (primary) hypertension: Secondary | ICD-10-CM | POA: Diagnosis not present

## 2012-10-01 DIAGNOSIS — IMO0001 Reserved for inherently not codable concepts without codable children: Secondary | ICD-10-CM | POA: Diagnosis not present

## 2012-10-01 DIAGNOSIS — E119 Type 2 diabetes mellitus without complications: Secondary | ICD-10-CM | POA: Insufficient documentation

## 2012-10-01 DIAGNOSIS — R079 Chest pain, unspecified: Secondary | ICD-10-CM | POA: Diagnosis not present

## 2012-10-01 DIAGNOSIS — K219 Gastro-esophageal reflux disease without esophagitis: Secondary | ICD-10-CM | POA: Diagnosis not present

## 2012-10-01 DIAGNOSIS — R059 Cough, unspecified: Secondary | ICD-10-CM | POA: Diagnosis not present

## 2012-10-01 DIAGNOSIS — R05 Cough: Secondary | ICD-10-CM | POA: Insufficient documentation

## 2012-10-01 DIAGNOSIS — J3489 Other specified disorders of nose and nasal sinuses: Secondary | ICD-10-CM | POA: Diagnosis not present

## 2012-10-01 LAB — CBC
HCT: 35.2 % — ABNORMAL LOW (ref 36.0–46.0)
Hemoglobin: 11.8 g/dL — ABNORMAL LOW (ref 12.0–15.0)
MCH: 30.4 pg (ref 26.0–34.0)
MCHC: 33.5 g/dL (ref 30.0–36.0)
RBC: 3.88 MIL/uL (ref 3.87–5.11)

## 2012-10-01 LAB — COMPREHENSIVE METABOLIC PANEL
ALT: 18 U/L (ref 0–35)
Alkaline Phosphatase: 86 U/L (ref 39–117)
BUN: 20 mg/dL (ref 6–23)
CO2: 23 mEq/L (ref 19–32)
GFR calc Af Amer: 55 mL/min — ABNORMAL LOW (ref >90)
GFR calc non Af Amer: 48 mL/min — ABNORMAL LOW (ref >90)
Glucose, Bld: 103 mg/dL — ABNORMAL HIGH (ref 70–99)
Potassium: 3.8 mEq/L (ref 3.5–5.1)
Sodium: 138 mEq/L (ref 135–145)
Total Protein: 7.8 g/dL (ref 6.0–8.3)

## 2012-10-01 LAB — PROTIME-INR: Prothrombin Time: 13.9 seconds (ref 11.6–15.2)

## 2012-10-01 LAB — POCT I-STAT TROPONIN I

## 2012-10-01 MED ORDER — ASPIRIN 81 MG PO CHEW
324.0000 mg | CHEWABLE_TABLET | Freq: Once | ORAL | Status: AC
  Administered 2012-10-01: 324 mg via ORAL
  Filled 2012-10-01: qty 4

## 2012-10-01 MED ORDER — IOHEXOL 350 MG/ML SOLN
100.0000 mL | Freq: Once | INTRAVENOUS | Status: AC | PRN
  Administered 2012-10-01: 100 mL via INTRAVENOUS

## 2012-10-01 MED ORDER — SODIUM CHLORIDE 0.9 % IV SOLN
1000.0000 mL | INTRAVENOUS | Status: DC
  Administered 2012-10-01: 1000 mL via INTRAVENOUS

## 2012-10-01 MED ORDER — HYDROMORPHONE HCL 8 MG PO TABS: 8.0000 mg | ORAL_TABLET | ORAL | Status: DC | PRN

## 2012-10-01 MED ORDER — HYDROMORPHONE HCL PF 1 MG/ML IJ SOLN
1.0000 mg | INTRAMUSCULAR | Status: DC | PRN
  Administered 2012-10-01 (×2): 1 mg via INTRAVENOUS
  Filled 2012-10-01 (×2): qty 1

## 2012-10-01 NOTE — ED Notes (Signed)
Pt c/o generalized body aches since Friday.  Reports woke up this am with left sided chest pain.  Reports pain doesn't radiate anywhere else and doesn't get worse with movement or palpation.

## 2012-10-01 NOTE — ED Notes (Signed)
Hold repeat troponin levels per Dr. Roselyn Bering.

## 2012-10-01 NOTE — ED Provider Notes (Signed)
History  This chart was scribed for Sandra Kras, MD by Marlin Canary ED Scribe. The patient was seen in room APA14/APA14. Patient's care was started at 0829.  CSN: [id number]  Arrival date & time 10/01/12  0814   First MD Initiated Contact with Patient 10/01/12 [phone]      Chief Complaint  Patient presents with  . Chest Pain  . Generalized Body Aches   The history is provided by the patient. No language interpreter was used.    HPI Comments: LEALER MARSLAND is a 67 y.o. female with history of fibromyalgia and hypertension who presents to the Emergency Department complaining of constant, non-radiating, moderate to severe, sharp central chest pain onset this morning. Patient also complains of generalized body aches onset 3 days ago. Other symptoms include rhinorrhea, cough, congestion and mild shortness of breath. Patient denies fever, nausea or edema. Patient says that she usually takes dilaudid for fibromyalgia associated body pains, but she has run out. She states that current body aches feel differently from regular fibromyalgia pain. She has not taken her blood pressure medicines today. Patient states she has never had a cardiac workup. She denies any known history of MI, DVT or PE. Patient has not had a flu shot this season.  Past Medical History  Diagnosis Date  . Other chronic pain 12/16/2010  . Unspecified essential hypertension 12/16/2010  . Other and unspecified hyperlipidemia 12/16/2010  . GERD (gastroesophageal reflux disease)   . Diabetes mellitus   . Thyroid disease   . Fibromyalgia     Past Surgical History  Procedure Date  . Cholecystectomy   . Spinal fixation surgery   . Abdominal hysterectomy 30 yrs ago  . Cataract extraction w/phaco 09/20/2011    Procedure: CATARACT EXTRACTION PHACO AND INTRAOCULAR LENS PLACEMENT (IOC);  Surgeon: Gemma Payor, MD;  Location: [facility];  Service: Ophthalmology;  Laterality: Left;  CDE:9:14  . Cataract extraction w/phaco 10/04/2011   Procedure: CATARACT EXTRACTION PHACO AND INTRAOCULAR LENS PLACEMENT (IOC);  Surgeon: Gemma Payor, MD;  Location: [facility];  Service: Ophthalmology;  Laterality: Right;  CDE:11.66    Family History  Problem Relation Age of Onset  . Anesthesia problems Neg Hx   . Hypotension Neg Hx   . Malignant hyperthermia Neg Hx   . Pseudochol deficiency Neg Hx     History  Substance Use Topics  . Smoking status: Former Smoker -- 0.2 packs/day for 1 years    Types: Cigarettes  . Smokeless tobacco: Former Neurosurgeon    Quit date: 09/14/1964  . Alcohol Use: No   No OB history provided.  Review of Systems A complete 10 system review of systems was obtained and all systems are negative except as noted in the HPI and PMH.   Allergies  Sulfa antibiotics and Cymbalta  Home Medications   Current Outpatient Rx  Name  Route  Sig  Dispense  Refill  . BISOPROLOL FUMARATE 5 MG PO TABS   Oral   Take 5 mg by mouth daily.           Marland Kitchen HYDROMORPHONE HCL 8 MG PO TABS   Oral   Take 8-16 mg by mouth every 4 (four) hours as needed. pain         . LEVOTHYROXINE SODIUM 50 MCG PO TABS   Oral   Take 50 mcg by mouth daily.           Marland Kitchen METOCLOPRAMIDE HCL 5 MG PO TABS   Oral   Take 5  mg by mouth 4 (four) times daily.           Marland Kitchen PREGABALIN 75 MG PO CAPS   Oral   Take 75 mg by mouth 3 (three) times daily.          Marland Kitchen SIMVASTATIN 40 MG PO TABS   Oral   Take 40 mg by mouth every evening.             BP 178/102  Pulse 71  Temp 98.4 F (36.9 C) (Oral)  Resp 20  Ht 5' (1.524 m)  Wt 145 lb (65.772 kg)  BMI 28.32 kg/m2  SpO2 98%  Physical Exam  Nursing note and vitals reviewed. Constitutional: She is oriented to person, place, and time. She appears well-developed and well-nourished. No distress.       Appears uncomfortable.  HENT:  Head: Normocephalic and atraumatic.  Right Ear: External ear normal.  Left Ear: External ear normal.  Eyes: Conjunctivae normal are normal. Right eye exhibits no  discharge. Left eye exhibits no discharge. No scleral icterus.  Neck: Neck supple. No tracheal deviation present.  Cardiovascular: Normal rate, regular rhythm and intact distal pulses.   Pulmonary/Chest: Effort normal and breath sounds normal. No stridor. No respiratory distress. She has no wheezes. She has no rales. She exhibits tenderness.  Abdominal: Soft. Bowel sounds are normal. She exhibits no distension. There is no tenderness. There is no rebound and no guarding.  Musculoskeletal: She exhibits tenderness (diffusely, all extremities). She exhibits no edema.  Neurological: She is alert and oriented to person, place, and time. She has normal strength. No sensory deficit. Cranial nerve deficit:  no gross defecits noted. She exhibits normal muscle tone. She displays no seizure activity. Coordination normal.  Skin: Skin is warm and dry. No rash noted. She is not diaphoretic.  Psychiatric: She has a normal mood and affect.    ED Course  Procedures (including critical care time) DIAGNOSTIC STUDIES: Oxygen Saturation is 100% on room air, Normal by my interpretation.    COORDINATION OF CARE: 0829-Patient informed of current plan for treatment and evaluation and agrees with plan at this time.   Rate: 72  Rhythm: normal sinus rhythm  QRS Axis: normal  Intervals: normal  ST/T Wave abnormalities: normal  Conduction Disutrbances:none  Narrative Interpretation: normal  Old EKG Reviewed: no significant changes Labs Reviewed  CBC - Abnormal; Notable for the following:    Hemoglobin 11.8 (*)     HCT 35.2 (*)     All other components within normal limits  COMPREHENSIVE METABOLIC PANEL - Abnormal; Notable for the following:    Glucose, Bld 103 (*)     Creatinine, Ser 1.15 (*)     GFR calc non Af Amer 48 (*)     GFR calc Af Amer 55 (*)     All other components within normal limits  D-DIMER, QUANTITATIVE - Abnormal; Notable for the following:    D-Dimer, Quant 1.12 (*)     All other  components within normal limits  PROTIME-INR  APTT  POCT I-STAT TROPONIN I   Dg Chest 2 View  10/01/2012  *RADIOLOGY REPORT*  Clinical Data: Chest pain, generalized body aches  CHEST - 2 VIEW  Comparison: 03/07/2009  Findings: Cardiomediastinal silhouette is stable.  No acute infiltrate or pleural effusion.  No pulmonary edema.  Mild degenerative changes thoracic spine.  IMPRESSION: No active disease.   Original Report Authenticated By: Natasha Mead, M.D.    Ct Angio Chest Pe W/cm &/or Wo  Cm  10/01/2012  *RADIOLOGY REPORT*  Clinical Data: Chest pain, elevated D-dimer  CT ANGIOGRAPHY CHEST  Technique:  Multidetector CT imaging of the chest using the standard protocol during bolus administration of intravenous contrast. Multiplanar reconstructed images including MIPs were obtained and reviewed to evaluate the vascular anatomy.  Contrast: OMNIPAQUE IOHEXOL 350 MG/ML SOLN  Comparison: 03/07/2009  Findings: Central airways are patent.  The study is of excellent technical quality.  No pulmonary embolus is noted.  Heart size is stable.  No pericardial effusion.  The patient is status post cholecystectomy.  Visualized upper abdomen is unremarkable.  No mediastinal hematoma or adenopathy.  Sagittal images of the spine shows stable degenerative changes. Sagittal view of the sternum is unremarkable.  Mild atherosclerotic calcifications of the thoracic aorta. Atherosclerotic calcifications of the coronary arteries.  Images of the lung parenchyma shows no acute infiltrate or pulmonary edema.  No focal consolidation.  No pulmonary nodules.  IMPRESSION:  1.  No pulmonary embolus. 2.  Degenerative changes thoracic spine. 3.  Status post cholecystectomy.   Original Report Authenticated By: Natasha Mead, M.D.        MDM  The patient does not have any evidence of acute abnormality that would account for her chest pain based on her laboratory tests and x-rays. Her d-dimer was elevated however fortunately her CAT scan  does not show any evidence of pulmonary embolism. There is also no evidence of aneurysm.  Patient does is history of fibromyalgia. She is supposed to be taking hydromorphone tablets for pain. I suspect that her pain is related to her fibromyalgia. She could have a component of withdrawal symptoms that she has not been taking her Dilaudid. At this time I do not feel there is any evidence to suggest acute coronary syndrome or other medical emergency. I will discharge the patient home and encourage her to take her pain medications.      I personally performed the services described in this documentation, which was scribed in my presence.  The recorded information has been reviewed and is accurate.   Sandra Kras, MD 10/01/12 [phone]

## 2012-10-26 ENCOUNTER — Other Ambulatory Visit (HOSPITAL_COMMUNITY): Payer: Self-pay | Admitting: Internal Medicine

## 2012-10-26 ENCOUNTER — Ambulatory Visit (HOSPITAL_COMMUNITY)
Admission: RE | Admit: 2012-10-26 | Discharge: 2012-10-26 | Disposition: A | Discharge status: Home / Self Care | Payer: Medicare Other | Source: Ambulatory Visit | Attending: Internal Medicine | Admitting: Internal Medicine

## 2012-10-26 DIAGNOSIS — R609 Edema, unspecified: Secondary | ICD-10-CM | POA: Diagnosis not present

## 2012-10-26 DIAGNOSIS — M7989 Other specified soft tissue disorders: Secondary | ICD-10-CM | POA: Diagnosis not present

## 2012-10-26 DIAGNOSIS — Z6829 Body mass index (BMI) 29.0-29.9, adult: Secondary | ICD-10-CM | POA: Diagnosis not present

## 2012-10-26 DIAGNOSIS — I10 Essential (primary) hypertension: Secondary | ICD-10-CM | POA: Diagnosis not present

## 2012-10-26 DIAGNOSIS — Z79899 Other long term (current) drug therapy: Secondary | ICD-10-CM | POA: Diagnosis not present

## 2012-10-26 DIAGNOSIS — E039 Hypothyroidism, unspecified: Secondary | ICD-10-CM | POA: Diagnosis not present

## 2012-12-05 DIAGNOSIS — I10 Essential (primary) hypertension: Secondary | ICD-10-CM | POA: Diagnosis not present

## 2012-12-05 DIAGNOSIS — N390 Urinary tract infection, site not specified: Secondary | ICD-10-CM | POA: Diagnosis not present

## 2012-12-05 DIAGNOSIS — Z6829 Body mass index (BMI) 29.0-29.9, adult: Secondary | ICD-10-CM | POA: Diagnosis not present

## 2012-12-05 DIAGNOSIS — E039 Hypothyroidism, unspecified: Secondary | ICD-10-CM | POA: Diagnosis not present

## 2013-01-12 ENCOUNTER — Other Ambulatory Visit (HOSPITAL_COMMUNITY): Payer: Self-pay | Admitting: Internal Medicine

## 2013-01-12 DIAGNOSIS — Z6829 Body mass index (BMI) 29.0-29.9, adult: Secondary | ICD-10-CM | POA: Diagnosis not present

## 2013-01-12 DIAGNOSIS — Z13820 Encounter for screening for osteoporosis: Secondary | ICD-10-CM

## 2013-01-12 DIAGNOSIS — E785 Hyperlipidemia, unspecified: Secondary | ICD-10-CM | POA: Diagnosis not present

## 2013-01-12 DIAGNOSIS — I10 Essential (primary) hypertension: Secondary | ICD-10-CM | POA: Diagnosis not present

## 2013-01-12 DIAGNOSIS — M79604 Pain in right leg: Secondary | ICD-10-CM

## 2013-01-12 DIAGNOSIS — G8929 Other chronic pain: Secondary | ICD-10-CM | POA: Diagnosis not present

## 2013-01-19 ENCOUNTER — Ambulatory Visit (HOSPITAL_COMMUNITY): Admission: RE | Admit: 2013-01-19 | Payer: Medicare Other | Source: Ambulatory Visit

## 2013-01-19 ENCOUNTER — Other Ambulatory Visit (HOSPITAL_COMMUNITY): Payer: Medicare Other

## 2013-02-12 DIAGNOSIS — E1142 Type 2 diabetes mellitus with diabetic polyneuropathy: Secondary | ICD-10-CM | POA: Diagnosis not present

## 2013-02-12 DIAGNOSIS — M159 Polyosteoarthritis, unspecified: Secondary | ICD-10-CM | POA: Diagnosis not present

## 2013-02-12 DIAGNOSIS — E1149 Type 2 diabetes mellitus with other diabetic neurological complication: Secondary | ICD-10-CM | POA: Diagnosis not present

## 2013-02-20 DIAGNOSIS — Z6828 Body mass index (BMI) 28.0-28.9, adult: Secondary | ICD-10-CM | POA: Diagnosis not present

## 2013-02-20 DIAGNOSIS — G8929 Other chronic pain: Secondary | ICD-10-CM | POA: Diagnosis not present

## 2013-02-25 ENCOUNTER — Encounter (HOSPITAL_COMMUNITY): Payer: Self-pay | Admitting: *Deleted

## 2013-02-25 ENCOUNTER — Emergency Department (HOSPITAL_COMMUNITY)
Admission: EM | Admit: 2013-02-25 | Discharge: 2013-02-25 | Disposition: A | Discharge status: Home / Self Care | Payer: Medicare Other | Attending: Emergency Medicine | Admitting: Emergency Medicine

## 2013-02-25 DIAGNOSIS — R110 Nausea: Secondary | ICD-10-CM | POA: Insufficient documentation

## 2013-02-25 DIAGNOSIS — R0602 Shortness of breath: Secondary | ICD-10-CM | POA: Insufficient documentation

## 2013-02-25 DIAGNOSIS — F19939 Other psychoactive substance use, unspecified with withdrawal, unspecified: Secondary | ICD-10-CM | POA: Insufficient documentation

## 2013-02-25 DIAGNOSIS — E785 Hyperlipidemia, unspecified: Secondary | ICD-10-CM | POA: Insufficient documentation

## 2013-02-25 DIAGNOSIS — Z79899 Other long term (current) drug therapy: Secondary | ICD-10-CM | POA: Insufficient documentation

## 2013-02-25 DIAGNOSIS — G8929 Other chronic pain: Secondary | ICD-10-CM | POA: Insufficient documentation

## 2013-02-25 DIAGNOSIS — R509 Fever, unspecified: Secondary | ICD-10-CM | POA: Insufficient documentation

## 2013-02-25 DIAGNOSIS — Z8719 Personal history of other diseases of the digestive system: Secondary | ICD-10-CM | POA: Insufficient documentation

## 2013-02-25 DIAGNOSIS — IMO0001 Reserved for inherently not codable concepts without codable children: Secondary | ICD-10-CM | POA: Insufficient documentation

## 2013-02-25 DIAGNOSIS — R079 Chest pain, unspecified: Secondary | ICD-10-CM | POA: Insufficient documentation

## 2013-02-25 DIAGNOSIS — Z87891 Personal history of nicotine dependence: Secondary | ICD-10-CM | POA: Insufficient documentation

## 2013-02-25 DIAGNOSIS — I10 Essential (primary) hypertension: Secondary | ICD-10-CM | POA: Insufficient documentation

## 2013-02-25 DIAGNOSIS — M329 Systemic lupus erythematosus, unspecified: Secondary | ICD-10-CM | POA: Insufficient documentation

## 2013-02-25 DIAGNOSIS — R000 Tachycardia, unspecified: Secondary | ICD-10-CM | POA: Insufficient documentation

## 2013-02-25 DIAGNOSIS — F329 Major depressive disorder, single episode, unspecified: Secondary | ICD-10-CM | POA: Insufficient documentation

## 2013-02-25 DIAGNOSIS — R002 Palpitations: Secondary | ICD-10-CM | POA: Insufficient documentation

## 2013-02-25 DIAGNOSIS — R109 Unspecified abdominal pain: Secondary | ICD-10-CM | POA: Insufficient documentation

## 2013-02-25 DIAGNOSIS — Z9089 Acquired absence of other organs: Secondary | ICD-10-CM | POA: Insufficient documentation

## 2013-02-25 DIAGNOSIS — E119 Type 2 diabetes mellitus without complications: Secondary | ICD-10-CM | POA: Insufficient documentation

## 2013-02-25 DIAGNOSIS — Z90710 Acquired absence of both cervix and uterus: Secondary | ICD-10-CM | POA: Insufficient documentation

## 2013-02-25 DIAGNOSIS — E079 Disorder of thyroid, unspecified: Secondary | ICD-10-CM | POA: Insufficient documentation

## 2013-02-25 DIAGNOSIS — F1123 Opioid dependence with withdrawal: Secondary | ICD-10-CM

## 2013-02-25 DIAGNOSIS — F3289 Other specified depressive episodes: Secondary | ICD-10-CM | POA: Insufficient documentation

## 2013-02-25 LAB — CBC WITH DIFFERENTIAL/PLATELET
Basophils Relative: 0 % (ref 0–1)
HCT: 31.9 % — ABNORMAL LOW (ref 36.0–46.0)
Hemoglobin: 10.7 g/dL — ABNORMAL LOW (ref 12.0–15.0)
MCH: 29 pg (ref 26.0–34.0)
MCHC: 33.5 g/dL (ref 30.0–36.0)
Monocytes Absolute: 0.2 10*3/uL (ref 0.1–1.0)
Monocytes Relative: 6 % (ref 3–12)
Neutro Abs: 1.8 10*3/uL (ref 1.7–7.7)

## 2013-02-25 LAB — BASIC METABOLIC PANEL
BUN: 14 mg/dL (ref 6–23)
Chloride: 101 mEq/L (ref 96–112)
Creatinine, Ser: 0.91 mg/dL (ref 0.50–1.10)
GFR calc Af Amer: 73 mL/min — ABNORMAL LOW (ref >90)
Glucose, Bld: 107 mg/dL — ABNORMAL HIGH (ref 70–99)

## 2013-02-25 MED ORDER — OXYCODONE-ACETAMINOPHEN 5-325 MG PO TABS
2.0000 | ORAL_TABLET | Freq: Once | ORAL | Status: AC
  Administered 2013-02-25: 2 via ORAL
  Filled 2013-02-25: qty 2

## 2013-02-25 MED ORDER — PROMETHAZINE HCL 25 MG PO TABS: 25.0000 mg | ORAL_TABLET | Freq: Three times a day (TID) | ORAL | Status: DC | PRN

## 2013-02-25 MED ORDER — CYCLOBENZAPRINE HCL 10 MG PO TABS: 10.0000 mg | ORAL_TABLET | Freq: Three times a day (TID) | ORAL | Status: DC | PRN

## 2013-02-25 MED ORDER — OXYCODONE-ACETAMINOPHEN 5-325 MG PO TABS: 2.0000 | ORAL_TABLET | ORAL | Status: DC | PRN

## 2013-02-25 NOTE — ED Notes (Addendum)
Pt c/o generalized body aches, fever that started Thursday, felt like she has having a fast heartbeat yesterday am and today.sob and nausea that started yesterday am. Pt states that she takes dilaudid 8mg  tablets 1-2 tablets as needed for chronic pain, last dose of pain medication was last week, does have prescription at [facility] but they have had to order the medication,

## 2013-02-25 NOTE — ED Provider Notes (Signed)
History    This chart was scribed for Ward Givens, MD by Leone Payor, ED Scribe. This patient was seen in room APA06/APA06 and the patient's care was started 1:21 PM.   CSN: [id number]  Arrival date & time 02/25/13  1252   First MD Initiated Contact with Patient 02/25/13 1301      Chief Complaint  Patient presents with  . Generalized Body Aches  . Tachycardia  . Fever     The history is provided by the patient. No language interpreter was used.    HPI Comments: [name] is a 67 y.o. female who presents to the Emergency Department complaining of body aches and heart palpitations  starting yesterday. It improved throughout the day but had another episode this morning at 9:30am. States she felt really bad and slumped over on the bed. She is feeling a dull, L sided chest pain currently but denies any heart palpitations currently. She denies h/o MI. States she has lupus but does not have prescribed medications for it. She was treated by Dr. Margo Aye dermatologist but has not seen him in years. She has mild SOB, nausea, abdominal pain. She denies cough, diarrhea. States Dr. Sherwood Gambler prescribes her pain medication but she ran out last week on 02/21/13. The pharmacy told her it would take a week before the rx would be filled, which is the usual pattern because of the volume of pain medications she receives. She failed to mention to her MD that she only had 2-4 pills left the day he saw her.   Pt is a former smoker but denies alcohol use.   PCP Dr Sherwood Gambler  Past Medical History  Diagnosis Date  . Other chronic pain 12/16/2010  . Unspecified essential hypertension 12/16/2010  . Other and unspecified hyperlipidemia 12/16/2010  . GERD (gastroesophageal reflux disease)   . Diabetes mellitus   . Thyroid disease   . Fibromyalgia     Past Surgical History  Procedure Laterality Date  . Cholecystectomy    . Spinal fixation surgery    . Abdominal hysterectomy  30 yrs ago  . Cataract extraction  w/phaco  09/20/2011    Procedure: CATARACT EXTRACTION PHACO AND INTRAOCULAR LENS PLACEMENT (IOC);  Surgeon: Gemma Payor, MD;  Location: [facility];  Service: Ophthalmology;  Laterality: Left;  CDE:9:14  . Cataract extraction w/phaco  10/04/2011    Procedure: CATARACT EXTRACTION PHACO AND INTRAOCULAR LENS PLACEMENT (IOC);  Surgeon: Gemma Payor, MD;  Location: [facility];  Service: Ophthalmology;  Laterality: Right;  CDE:11.66    Family History  Problem Relation Age of Onset  . Anesthesia problems Neg Hx   . Hypotension Neg Hx   . Malignant hyperthermia Neg Hx   . Pseudochol deficiency Neg Hx     History  Substance Use Topics  . Smoking status: Former Smoker -- 0.25 packs/day for 1 years    Types: Cigarettes  . Smokeless tobacco: Former Neurosurgeon    Quit date: 09/14/1964  . Alcohol Use: No  lives at home Lives with son  OB History   Grav Para Term Preterm Abortions TAB SAB Ect Mult Living                  Review of Systems  Respiratory: Positive for shortness of breath. Negative for cough.   Cardiovascular: Positive for chest pain and palpitations.  Gastrointestinal: Positive for nausea and abdominal pain. Negative for vomiting and diarrhea.    Allergies  Sulfa antibiotics and Cymbalta  Home Medications  Current Outpatient Rx  Name  Route  Sig  Dispense  Refill  . bisoprolol (ZEBETA) 5 MG tablet   Oral   Take 5 mg by mouth daily.           Marland Kitchen HYDROmorphone (DILAUDID) 8 MG tablet   Oral   Take 1-2 tablets (8-16 mg total) by mouth every 4 (four) hours as needed. pain   12 tablet   0   . levothyroxine (SYNTHROID, LEVOTHROID) 88 MCG tablet   Oral   Take 88 mcg by mouth daily before breakfast.         . metoCLOPramide (REGLAN) 5 MG tablet   Oral   Take 5 mg by mouth 4 (four) times daily.           . pregabalin (LYRICA) 100 MG capsule   Oral   Take 100 mg by mouth 3 (three) times daily.         . simvastatin (ZOCOR) 40 MG tablet   Oral   Take 40 mg by mouth every  evening.             BP 166/79  Pulse 71  Temp(Src) 99.2 F (37.3 C) (Oral)  Resp 20  SpO2 97%  Vital signs normal    Physical Exam  Nursing note and vitals reviewed. Constitutional: She is oriented to person, place, and time. She appears well-developed and well-nourished.  Non-toxic appearance. She does not appear ill. No distress.  Looks distressed. Flat affect   HENT:  Head: Normocephalic and atraumatic.  Right Ear: External ear normal.  Left Ear: External ear normal.  Nose: Nose normal. No mucosal edema or rhinorrhea.  Mouth/Throat: Oropharynx is clear and moist and mucous membranes are normal. No dental abscesses or edematous.  Eyes: Conjunctivae and EOM are normal. Pupils are equal, round, and reactive to light.  Neck: Normal range of motion and full passive range of motion without pain. Neck supple.  Cardiovascular: Normal rate, regular rhythm and normal heart sounds.  Exam reveals no gallop and no friction rub.   No murmur heard. Pulmonary/Chest: Effort normal and breath sounds normal. No respiratory distress. She has no wheezes. She has no rhonchi. She has no rales. She exhibits no tenderness and no crepitus.  Abdominal: Soft. Normal appearance and bowel sounds are normal. She exhibits no distension. There is no tenderness. There is no rebound and no guarding.  Musculoskeletal: Normal range of motion. She exhibits no edema and no tenderness.  Moves all extremities well.   Neurological: She is alert and oriented to person, place, and time. She has normal strength. No cranial nerve deficit.  Skin: Skin is warm, dry and intact. No rash noted. No erythema. No pallor.  Psychiatric: Her speech is normal. Her mood appears not anxious. She is slowed. She exhibits a depressed mood.    ED Course  Procedures (including critical care time) Medications  oxyCODONE-acetaminophen (PERCOCET/ROXICET) 5-325 MG per tablet 2 tablet (2 tablets Oral Given 02/25/13 1331)     DIAGNOSTIC  STUDIES: Oxygen Saturation is 97% on RA, adequate by my interpretation.    COORDINATION OF CARE: 1:26 PM Discussed treatment plan with pt at bedside and pt agreed to plan.   Review of the [facility] site shows she gets 240 hydromorphone tabs 8 mg monthly, the last was 5/9.  Have discussed she needs to get with her PCP to supply her with more of her chronic pain medications  Results for orders placed during the hospital encounter of 02/25/13  CBC  WITH DIFFERENTIAL      Result Value Range   WBC 3.2 (*) 4.0 - 10.5 K/uL   RBC 3.69 (*) 3.87 - 5.11 MIL/uL   Hemoglobin 10.7 (*) 12.0 - 15.0 g/dL   HCT 65.7 (*) 84.6 - 96.2 %   MCV 86.4  78.0 - 100.0 fL   MCH 29.0  26.0 - 34.0 pg   MCHC 33.5  30.0 - 36.0 g/dL   RDW 95.2  84.1 - 32.4 %   Platelets 157  150 - 400 K/uL   Neutrophils Relative % 56  43 - 77 %   Neutro Abs 1.8  1.7 - 7.7 K/uL   Lymphocytes Relative 34  12 - 46 %   Lymphs Abs 1.1  0.7 - 4.0 K/uL   Monocytes Relative 6  3 - 12 %   Monocytes Absolute 0.2  0.1 - 1.0 K/uL   Eosinophils Relative 4  0 - 5 %   Eosinophils Absolute 0.1  0.0 - 0.7 K/uL   Basophils Relative 0  0 - 1 %   Basophils Absolute 0.0  0.0 - 0.1 K/uL  BASIC METABOLIC PANEL      Result Value Range   Sodium 138  135 - 145 mEq/L   Potassium 4.4  3.5 - 5.1 mEq/L   Chloride 101  96 - 112 mEq/L   CO2 27  19 - 32 mEq/L   Glucose, Bld 107 (*) 70 - 99 mg/dL   BUN 14  6 - 23 mg/dL   Creatinine, Ser 4.01  0.50 - 1.10 mg/dL   Calcium 9.7  8.4 - 02.7 mg/dL   GFR calc non Af Amer 63 (*) >90 mL/min   GFR calc Af Amer 73 (*) >90 mL/min  TROPONIN I      Result Value Range   Troponin I <0.30  <0.30 ng/mL   Laboratory interpretation all normal except anemia, low WBC without neutropenia    Date: 02/25/2013  Rate: 70  Rhythm: normal sinus rhythm  QRS Axis: normal  Intervals: normal  ST/T Wave abnormalities: normal  Conduction Disutrbances:none  Narrative Interpretation:   Old EKG Reviewed: none available    1.  Narcotic withdrawal    New Prescriptions   CYCLOBENZAPRINE (FLEXERIL) 10 MG TABLET    Take 1 tablet (10 mg total) by mouth 3 (three) times daily as needed for muscle spasms.   OXYCODONE-ACETAMINOPHEN (PERCOCET/ROXICET) 5-325 MG PER TABLET    Take 2 tablets by mouth every 4 (four) hours as needed for pain.   PROMETHAZINE (PHENERGAN) 25 MG TABLET    Take 1 tablet (25 mg total) by mouth every 8 (eight) hours as needed for nausea.   Plan discharge  Devoria Albe, MD, FACEP     MDM  I personally performed the services described in this documentation, which was scribed in my presence. The recorded information has been reviewed and considered.  Devoria Albe, MD, Arm[facility] Gang    Ward Givens, MD 02/25/13 [phone]

## 2013-03-22 DIAGNOSIS — G8929 Other chronic pain: Secondary | ICD-10-CM | POA: Diagnosis not present

## 2013-03-22 DIAGNOSIS — Z6828 Body mass index (BMI) 28.0-28.9, adult: Secondary | ICD-10-CM | POA: Diagnosis not present

## 2013-05-29 DIAGNOSIS — G8929 Other chronic pain: Secondary | ICD-10-CM | POA: Diagnosis not present

## 2013-05-29 DIAGNOSIS — Z6828 Body mass index (BMI) 28.0-28.9, adult: Secondary | ICD-10-CM | POA: Diagnosis not present

## 2013-07-11 DIAGNOSIS — E039 Hypothyroidism, unspecified: Secondary | ICD-10-CM | POA: Diagnosis not present

## 2013-07-11 DIAGNOSIS — I10 Essential (primary) hypertension: Secondary | ICD-10-CM | POA: Diagnosis not present

## 2013-07-11 DIAGNOSIS — M159 Polyosteoarthritis, unspecified: Secondary | ICD-10-CM | POA: Diagnosis not present

## 2013-07-11 DIAGNOSIS — G8929 Other chronic pain: Secondary | ICD-10-CM | POA: Diagnosis not present

## 2013-07-11 DIAGNOSIS — Z6828 Body mass index (BMI) 28.0-28.9, adult: Secondary | ICD-10-CM | POA: Diagnosis not present

## 2013-08-08 DIAGNOSIS — Z6828 Body mass index (BMI) 28.0-28.9, adult: Secondary | ICD-10-CM | POA: Diagnosis not present

## 2013-08-08 DIAGNOSIS — G8929 Other chronic pain: Secondary | ICD-10-CM | POA: Diagnosis not present

## 2013-09-13 DIAGNOSIS — G8929 Other chronic pain: Secondary | ICD-10-CM | POA: Diagnosis not present

## 2013-09-13 DIAGNOSIS — Z6830 Body mass index (BMI) 30.0-30.9, adult: Secondary | ICD-10-CM | POA: Diagnosis not present

## 2013-10-16 DIAGNOSIS — I10 Essential (primary) hypertension: Secondary | ICD-10-CM | POA: Diagnosis not present

## 2013-10-16 DIAGNOSIS — G8929 Other chronic pain: Secondary | ICD-10-CM | POA: Diagnosis not present

## 2013-10-16 DIAGNOSIS — Z0000 Encounter for general adult medical examination without abnormal findings: Secondary | ICD-10-CM | POA: Diagnosis not present

## 2013-10-16 DIAGNOSIS — Z6830 Body mass index (BMI) 30.0-30.9, adult: Secondary | ICD-10-CM | POA: Diagnosis not present

## 2013-10-16 DIAGNOSIS — Z01419 Encounter for gynecological examination (general) (routine) without abnormal findings: Secondary | ICD-10-CM | POA: Diagnosis not present

## 2013-10-16 DIAGNOSIS — Z79899 Other long term (current) drug therapy: Secondary | ICD-10-CM | POA: Diagnosis not present

## 2013-10-16 DIAGNOSIS — M159 Polyosteoarthritis, unspecified: Secondary | ICD-10-CM | POA: Diagnosis not present

## 2013-11-13 DIAGNOSIS — G8929 Other chronic pain: Secondary | ICD-10-CM | POA: Diagnosis not present

## 2013-11-13 DIAGNOSIS — Z6830 Body mass index (BMI) 30.0-30.9, adult: Secondary | ICD-10-CM | POA: Diagnosis not present

## 2013-12-26 DIAGNOSIS — Z6829 Body mass index (BMI) 29.0-29.9, adult: Secondary | ICD-10-CM | POA: Diagnosis not present

## 2013-12-26 DIAGNOSIS — G8929 Other chronic pain: Secondary | ICD-10-CM | POA: Diagnosis not present

## 2014-01-14 ENCOUNTER — Emergency Department (HOSPITAL_COMMUNITY): Payer: Medicare Other

## 2014-01-14 ENCOUNTER — Encounter (HOSPITAL_COMMUNITY): Payer: Self-pay | Admitting: Emergency Medicine

## 2014-01-14 ENCOUNTER — Emergency Department (HOSPITAL_COMMUNITY)
Admission: EM | Admit: 2014-01-14 | Discharge: 2014-01-14 | Disposition: A | Discharge status: Home / Self Care | Payer: Medicare Other | Attending: Emergency Medicine | Admitting: Emergency Medicine

## 2014-01-14 DIAGNOSIS — R5381 Other malaise: Secondary | ICD-10-CM | POA: Diagnosis not present

## 2014-01-14 DIAGNOSIS — R609 Edema, unspecified: Secondary | ICD-10-CM | POA: Diagnosis not present

## 2014-01-14 DIAGNOSIS — R0602 Shortness of breath: Secondary | ICD-10-CM | POA: Diagnosis not present

## 2014-01-14 DIAGNOSIS — Z87891 Personal history of nicotine dependence: Secondary | ICD-10-CM | POA: Insufficient documentation

## 2014-01-14 DIAGNOSIS — K219 Gastro-esophageal reflux disease without esophagitis: Secondary | ICD-10-CM | POA: Diagnosis not present

## 2014-01-14 DIAGNOSIS — E119 Type 2 diabetes mellitus without complications: Secondary | ICD-10-CM | POA: Diagnosis not present

## 2014-01-14 DIAGNOSIS — E079 Disorder of thyroid, unspecified: Secondary | ICD-10-CM | POA: Insufficient documentation

## 2014-01-14 DIAGNOSIS — G8929 Other chronic pain: Secondary | ICD-10-CM | POA: Diagnosis not present

## 2014-01-14 DIAGNOSIS — R5383 Other fatigue: Secondary | ICD-10-CM

## 2014-01-14 DIAGNOSIS — Z79899 Other long term (current) drug therapy: Secondary | ICD-10-CM | POA: Diagnosis not present

## 2014-01-14 DIAGNOSIS — I10 Essential (primary) hypertension: Secondary | ICD-10-CM | POA: Diagnosis not present

## 2014-01-14 DIAGNOSIS — IMO0001 Reserved for inherently not codable concepts without codable children: Secondary | ICD-10-CM | POA: Diagnosis not present

## 2014-01-14 LAB — CBC WITH DIFFERENTIAL/PLATELET
Basophils Absolute: 0 10*3/uL (ref 0.0–0.1)
Basophils Relative: 0 % (ref 0–1)
Eosinophils Absolute: 0.1 10*3/uL (ref 0.0–0.7)
Eosinophils Relative: 5 % (ref 0–5)
HEMATOCRIT: 32.7 % — AB (ref 36.0–46.0)
HEMOGLOBIN: 10.4 g/dL — AB (ref 12.0–15.0)
LYMPHS ABS: 1.1 10*3/uL (ref 0.7–4.0)
LYMPHS PCT: 37 % (ref 12–46)
MCH: 28.8 pg (ref 26.0–34.0)
MCHC: 31.8 g/dL (ref 30.0–36.0)
MCV: 90.6 fL (ref 78.0–100.0)
MONO ABS: 0.2 10*3/uL (ref 0.1–1.0)
MONOS PCT: 7 % (ref 3–12)
Neutro Abs: 1.5 10*3/uL — ABNORMAL LOW (ref 1.7–7.7)
Neutrophils Relative %: 51 % (ref 43–77)
Platelets: 185 10*3/uL (ref 150–400)
RBC: 3.61 MIL/uL — ABNORMAL LOW (ref 3.87–5.11)
RDW: 13 % (ref 11.5–15.5)
WBC: 2.9 10*3/uL — AB (ref 4.0–10.5)

## 2014-01-14 LAB — COMPREHENSIVE METABOLIC PANEL
ALT: 33 U/L (ref 0–35)
AST: 44 U/L — AB (ref 0–37)
Albumin: 3.7 g/dL (ref 3.5–5.2)
Alkaline Phosphatase: 109 U/L (ref 39–117)
BUN: 15 mg/dL (ref 6–23)
CHLORIDE: 103 meq/L (ref 96–112)
CO2: 27 meq/L (ref 19–32)
CREATININE: 1 mg/dL (ref 0.50–1.10)
Calcium: 9.8 mg/dL (ref 8.4–10.5)
GFR calc Af Amer: 65 mL/min — ABNORMAL LOW (ref >90)
GFR, EST NON AFRICAN AMERICAN: 56 mL/min — AB (ref >90)
Glucose, Bld: 94 mg/dL (ref 70–99)
Potassium: 4.2 mEq/L (ref 3.7–5.3)
Sodium: 141 mEq/L (ref 137–147)
Total Bilirubin: 0.4 mg/dL (ref 0.3–1.2)
Total Protein: 7.7 g/dL (ref 6.0–8.3)

## 2014-01-14 LAB — URINALYSIS, ROUTINE W REFLEX MICROSCOPIC
Bilirubin Urine: NEGATIVE
Glucose, UA: NEGATIVE mg/dL
Hgb urine dipstick: NEGATIVE
Ketones, ur: NEGATIVE mg/dL
Nitrite: NEGATIVE
Protein, ur: NEGATIVE mg/dL
SPECIFIC GRAVITY, URINE: 1.02 (ref 1.005–1.030)
UROBILINOGEN UA: 0.2 mg/dL (ref 0.0–1.0)
pH: 6 (ref 5.0–8.0)

## 2014-01-14 LAB — CK: Total CK: 135 U/L (ref 7–177)

## 2014-01-14 LAB — TROPONIN I

## 2014-01-14 LAB — URINE MICROSCOPIC-ADD ON

## 2014-01-14 LAB — PRO B NATRIURETIC PEPTIDE: PRO B NATRI PEPTIDE: 332.1 pg/mL — AB (ref 0–125)

## 2014-01-14 MED ORDER — FUROSEMIDE 20 MG PO TABS
20.0000 mg | ORAL_TABLET | Freq: Every day | ORAL | Status: DC
Start: 2014-01-14 — End: 2018-04-24

## 2014-01-14 MED ORDER — FUROSEMIDE 10 MG/ML IJ SOLN
20.0000 mg | Freq: Once | INTRAMUSCULAR | Status: AC
  Administered 2014-01-14: 20 mg via INTRAVENOUS
  Filled 2014-01-14: qty 2

## 2014-01-14 MED ORDER — POTASSIUM CHLORIDE CRYS ER 20 MEQ PO TBCR: 20.0000 meq | EXTENDED_RELEASE_TABLET | Freq: Every day | ORAL | Status: DC

## 2014-01-14 NOTE — ED Notes (Signed)
bil leg pain, with swelling. Nausea, feels weak.  No vomiting or diarrhea.

## 2014-01-14 NOTE — Discharge Instructions (Signed)
Edema °Edema is a buildup of fluids. It is most common in the feet, ankles, and legs. This happens more as a person ages. It may affect one or both legs. °HOME CARE  °· Raise (elevate) the legs or ankles above the level of the heart while lying down. °· Avoid sitting or standing still for a long time. °· Exercise the legs to help the puffiness (swelling) go down. °· A low-salt diet may help lessen the puffiness. °· Only take medicine as told by your doctor. °GET HELP RIGHT AWAY IF:  °· You develop shortness of breath or chest pain. °· You cannot breathe when you lie down. °· You have more puffiness that does not go away with treatment. °· You develop pain or redness in the areas that are puffy. °· You have a temperature by mouth above 102° F (38.9° C), not controlled by medicine. °· You gain 03 lb/1.4 kg or more in 1 day or 05 lb/2.3 kg in a week. °MAKE SURE YOU:  °· Understand these instructions. °· Will watch your condition. °· Will get help right away if you are not doing well or get worse. °Document Released: 02/09/2008 Document Revised: 11/15/2011 Document Reviewed: 02/09/2008 °[employer]® Patient Information ©2014 [employer]. ° °

## 2014-01-14 NOTE — ED Notes (Signed)
Pt continues to c/o leg pain.

## 2014-01-14 NOTE — ED Provider Notes (Addendum)
CSN: [id number]     Arrival date & time 01/14/14  1348 History   First MD Initiated Contact with Patient 01/14/14 1405     Chief Complaint  Patient presents with  . Leg Pain     (Consider location/radiation/quality/duration/timing/severity/associated sxs/prior Treatment) HPI Comments: Patient presents with multiple complaints. Patient reports that she has been having intermittent cramping pain in her legs for last couple of days. Pain is present at rest as well as with trying to walk, does not appear to worsen with walking. She has diffuse swelling of the legs and all of her muscles are aching. There is some mild shortness of breath associated with the symptoms. No skin changes of the legs. She has a history of chronic pain and takes hydromorphone for this. She says this has not helped.  Patient is a 68 y.o. female presenting with leg pain.  Leg Pain   Past Medical History  Diagnosis Date  . Other chronic pain 12/16/2010  . Unspecified essential hypertension 12/16/2010  . Other and unspecified hyperlipidemia 12/16/2010  . GERD (gastroesophageal reflux disease)   . Diabetes mellitus   . Thyroid disease   . Fibromyalgia    Past Surgical History  Procedure Laterality Date  . Cholecystectomy    . Spinal fixation surgery    . Abdominal hysterectomy  30 yrs ago  . Cataract extraction w/phaco  09/20/2011    Procedure: CATARACT EXTRACTION PHACO AND INTRAOCULAR LENS PLACEMENT (IOC);  Surgeon: Tonny Branch, MD;  Location: [facility];  Service: Ophthalmology;  Laterality: Left;  CDE:9:14  . Cataract extraction w/phaco  10/04/2011    Procedure: CATARACT EXTRACTION PHACO AND INTRAOCULAR LENS PLACEMENT (IOC);  Surgeon: Tonny Branch, MD;  Location: [facility];  Service: Ophthalmology;  Laterality: Right;  CDE:11.66   Family History  Problem Relation Age of Onset  . Anesthesia problems Neg Hx   . Hypotension Neg Hx   . Malignant hyperthermia Neg Hx   . Pseudochol deficiency Neg Hx    History  Substance  Use Topics  . Smoking status: Former Smoker -- 0.25 packs/day for 1 years    Types: Cigarettes  . Smokeless tobacco: Former Systems developer    Quit date: 09/14/1964  . Alcohol Use: No   OB History   Grav Para Term Preterm Abortions TAB SAB Ect Mult Living                 Review of Systems  Respiratory: Positive for shortness of breath.   Cardiovascular: Positive for leg swelling.  Musculoskeletal: Positive for myalgias.  All other systems reviewed and are negative.     Allergies  Sulfa antibiotics and Cymbalta  Home Medications   Prior to Admission medications   Medication Sig Start Date End Date Taking? Authorizing Provider  bisoprolol (ZEBETA) 5 MG tablet Take 5 mg by mouth daily.      Historical Provider, MD  cyclobenzaprine (FLEXERIL) 10 MG tablet Take 1 tablet (10 mg total) by mouth 3 (three) times daily as needed for muscle spasms. 02/25/13   Janice Norrie, MD  HYDROmorphone (DILAUDID) 8 MG tablet Take 1-2 tablets (8-16 mg total) by mouth every 4 (four) hours as needed. pain 10/01/12   Kathalene Frames, MD  levothyroxine (SYNTHROID, LEVOTHROID) 88 MCG tablet Take 88 mcg by mouth daily before breakfast.    Historical Provider, MD  metoCLOPramide (REGLAN) 5 MG tablet Take 5 mg by mouth 4 (four) times daily.      Historical Provider, MD  oxyCODONE-acetaminophen (PERCOCET/ROXICET) 5-325 MG  per tablet Take 2 tablets by mouth every 4 (four) hours as needed for pain. 02/25/13   Janice Norrie, MD  pregabalin (LYRICA) 100 MG capsule Take 100 mg by mouth 3 (three) times daily.    Historical Provider, MD  promethazine (PHENERGAN) 25 MG tablet Take 1 tablet (25 mg total) by mouth every 8 (eight) hours as needed for nausea. 02/25/13   Janice Norrie, MD  simvastatin (ZOCOR) 40 MG tablet Take 40 mg by mouth every evening.      Historical Provider, MD   BP 138/79  Pulse 68  Temp(Src) 98.6 F (37 C) (Oral)  Resp 13  Ht 4\' 11"  (1.499 m)  Wt 140 lb (63.504 kg)  BMI 28.26 kg/m2  SpO2 96% Physical Exam   Constitutional: She is oriented to person, place, and time. She appears well-developed and well-nourished. No distress.  HENT:  Head: Normocephalic and atraumatic.  Right Ear: Hearing normal.  Left Ear: Hearing normal.  Nose: Nose normal.  Mouth/Throat: Oropharynx is clear and moist and mucous membranes are normal.  Eyes: Conjunctivae and EOM are normal. Pupils are equal, round, and reactive to light.  Neck: Normal range of motion. Neck supple.  Cardiovascular: Regular rhythm, S1 normal and S2 normal.  Exam reveals no gallop and no friction rub.   No murmur heard. Pulmonary/Chest: Effort normal and breath sounds normal. No respiratory distress. She exhibits no tenderness.  Abdominal: Soft. Normal appearance and bowel sounds are normal. There is no hepatosplenomegaly. There is no tenderness. There is no rebound, no guarding, no tenderness at McBurney's point and negative Murphy's sign. No hernia.  Musculoskeletal: Normal range of motion. She exhibits edema (bilateral, mild, non-pitting).  Neurological: She is alert and oriented to person, place, and time. She has normal strength. No cranial nerve deficit or sensory deficit. Coordination normal. GCS eye subscore is 4. GCS verbal subscore is 5. GCS motor subscore is 6.  Skin: Skin is warm, dry and intact. No rash noted. No cyanosis.  Psychiatric: She has a normal mood and affect. Her speech is normal and behavior is normal. Thought content normal.    ED Course  Procedures (including critical care time) Labs Review Labs Reviewed  CBC WITH DIFFERENTIAL - Abnormal; Notable for the following:    WBC 2.9 (*)    RBC 3.61 (*)    Hemoglobin 10.4 (*)    HCT 32.7 (*)    Neutro Abs 1.5 (*)    All other components within normal limits  COMPREHENSIVE METABOLIC PANEL - Abnormal; Notable for the following:    AST 44 (*)    GFR calc non Af Amer 56 (*)    GFR calc Af Amer 65 (*)    All other components within normal limits  PRO B NATRIURETIC PEPTIDE  - Abnormal; Notable for the following:    Pro B Natriuretic peptide (BNP) 332.1 (*)    All other components within normal limits  URINALYSIS, ROUTINE W REFLEX MICROSCOPIC - Abnormal; Notable for the following:    Leukocytes, UA MODERATE (*)    All other components within normal limits  URINE MICROSCOPIC-ADD ON - Abnormal; Notable for the following:    Squamous Epithelial / LPF FEW (*)    Bacteria, UA FEW (*)    All other components within normal limits  URINE CULTURE  TROPONIN I  CK  TSH    Imaging Review Dg Chest 2 View  01/14/2014   CLINICAL DATA:  Shortness of breath  EXAM: CHEST  2 VIEW  COMPARISON:  CT ANGIO CHEST W/CM &/OR WO/CM dated 10/01/2012; DG CHEST 2 VIEW dated 10/01/2012  FINDINGS: Mild cardiomegaly with aortic unfolding noted. The lungs are clear. No pleural effusion. Minimal crowding at the lung bases is noted. Cholecystectomy clips are noted. No acute osseous abnormality.  IMPRESSION: Mild cardiomegaly with crowding of lung markings at the bases but no focal consolidation.   Electronically Signed   By: Conchita Paris M.D.   On: 01/14/2014 15:58     EKG Interpretation   Date/Time:  Monday Jan 14 2014 14:39:16 EDT Ventricular Rate:  65 PR Interval:  219 QRS Duration: 69 QT Interval:  420 QTC Calculation: 437 R Axis:   17 Text Interpretation:  Sinus rhythm Borderline prolonged PR interval  Abnormal R-wave progression, early transition No significant change since  last tracing Confirmed by POLLINA  MD, [place] [phone]) on 01/14/2014  2:45:20 PM      MDM   Final diagnoses:  Peripheral edema  Chronic pain    Patient presents to the ER for evaluation of swelling of her legs with pain. She does have generalized weakness. There is no chest pain. She feels slightly short of breath, no cough, congestion or fever. Patient does have swelling of her hands and legs. Legs are more swollen than the hands, is mostly nonpitting with the slight pitting component. She is  difficult to evaluate because she does have a history of chronic pain secondary to fibromyalgia. Objectively, however, patient does have mildly elevated BNP, increased vascular crowding on chest x-ray, lower extremity edema and I cannot rule out congestive heart failure. Reviewing the patient's records, I cannot find a previous echo.   EKG is unremarkable. Troponin is negative. TSH is pending, consider myxedema as a cause for swelling.  Case was discussed with Doctor Dhungel, reviewed the patient's labs and x-rays today. He recommended diuretic with close followup by primary care to schedule echo. He did not feel the patient required hospitalization at this time. When I discussed this with the patient, she was in agreement. She would prefer to go home. She will call Doctor Fosco in the morning for further evaluation. She was given Lasix 20 mg IV here in the ER and a seven-day course of Lasix 20 mg by mouth, potassium 20 mEq by mouth. Return to the ER for worsening symptoms. Continue her current pain management strategy.    Orpah Greek, MD 01/14/14 [place], MD 01/14/14 1734

## 2014-01-15 DIAGNOSIS — R079 Chest pain, unspecified: Secondary | ICD-10-CM | POA: Diagnosis not present

## 2014-01-15 DIAGNOSIS — IMO0001 Reserved for inherently not codable concepts without codable children: Secondary | ICD-10-CM | POA: Diagnosis not present

## 2014-01-15 DIAGNOSIS — Z6829 Body mass index (BMI) 29.0-29.9, adult: Secondary | ICD-10-CM | POA: Diagnosis not present

## 2014-01-15 LAB — URINE CULTURE

## 2014-01-15 LAB — TSH: TSH: 0.539 u[IU]/mL (ref 0.350–4.500)

## 2014-01-29 ENCOUNTER — Other Ambulatory Visit (HOSPITAL_COMMUNITY): Payer: Self-pay | Admitting: Internal Medicine

## 2014-01-29 DIAGNOSIS — I872 Venous insufficiency (chronic) (peripheral): Secondary | ICD-10-CM | POA: Diagnosis not present

## 2014-01-29 DIAGNOSIS — M810 Age-related osteoporosis without current pathological fracture: Secondary | ICD-10-CM

## 2014-01-29 DIAGNOSIS — Z0000 Encounter for general adult medical examination without abnormal findings: Secondary | ICD-10-CM

## 2014-01-29 DIAGNOSIS — Z6829 Body mass index (BMI) 29.0-29.9, adult: Secondary | ICD-10-CM | POA: Diagnosis not present

## 2014-01-29 DIAGNOSIS — Z1231 Encounter for screening mammogram for malignant neoplasm of breast: Secondary | ICD-10-CM

## 2014-01-29 DIAGNOSIS — R030 Elevated blood-pressure reading, without diagnosis of hypertension: Secondary | ICD-10-CM | POA: Diagnosis not present

## 2014-01-29 DIAGNOSIS — R609 Edema, unspecified: Secondary | ICD-10-CM | POA: Diagnosis not present

## 2014-02-05 ENCOUNTER — Other Ambulatory Visit (HOSPITAL_COMMUNITY): Payer: Medicare Other

## 2014-02-05 ENCOUNTER — Inpatient Hospital Stay (HOSPITAL_COMMUNITY): Admission: RE | Admit: 2014-02-05 | Payer: Medicare Other | Source: Ambulatory Visit

## 2014-02-28 DIAGNOSIS — Z6829 Body mass index (BMI) 29.0-29.9, adult: Secondary | ICD-10-CM | POA: Diagnosis not present

## 2014-02-28 DIAGNOSIS — G8929 Other chronic pain: Secondary | ICD-10-CM | POA: Diagnosis not present

## 2014-04-16 DIAGNOSIS — G8929 Other chronic pain: Secondary | ICD-10-CM | POA: Diagnosis not present

## 2014-04-16 DIAGNOSIS — Z6830 Body mass index (BMI) 30.0-30.9, adult: Secondary | ICD-10-CM | POA: Diagnosis not present

## 2014-05-21 DIAGNOSIS — E039 Hypothyroidism, unspecified: Secondary | ICD-10-CM | POA: Diagnosis not present

## 2014-05-21 DIAGNOSIS — G8929 Other chronic pain: Secondary | ICD-10-CM | POA: Diagnosis not present

## 2014-05-21 DIAGNOSIS — I10 Essential (primary) hypertension: Secondary | ICD-10-CM | POA: Diagnosis not present

## 2014-05-21 DIAGNOSIS — R5381 Other malaise: Secondary | ICD-10-CM | POA: Diagnosis not present

## 2014-05-21 DIAGNOSIS — R5383 Other fatigue: Secondary | ICD-10-CM | POA: Diagnosis not present

## 2014-05-21 DIAGNOSIS — Z6829 Body mass index (BMI) 29.0-29.9, adult: Secondary | ICD-10-CM | POA: Diagnosis not present

## 2014-07-02 DIAGNOSIS — K219 Gastro-esophageal reflux disease without esophagitis: Secondary | ICD-10-CM | POA: Diagnosis not present

## 2014-07-02 DIAGNOSIS — E063 Autoimmune thyroiditis: Secondary | ICD-10-CM | POA: Diagnosis not present

## 2014-07-02 DIAGNOSIS — F419 Anxiety disorder, unspecified: Secondary | ICD-10-CM | POA: Diagnosis not present

## 2014-07-02 DIAGNOSIS — G894 Chronic pain syndrome: Secondary | ICD-10-CM | POA: Diagnosis not present

## 2014-08-05 DIAGNOSIS — G894 Chronic pain syndrome: Secondary | ICD-10-CM | POA: Diagnosis not present

## 2014-08-05 DIAGNOSIS — Z6831 Body mass index (BMI) 31.0-31.9, adult: Secondary | ICD-10-CM | POA: Diagnosis not present

## 2014-08-05 DIAGNOSIS — E6609 Other obesity due to excess calories: Secondary | ICD-10-CM | POA: Diagnosis not present

## 2014-09-10 DIAGNOSIS — Z6831 Body mass index (BMI) 31.0-31.9, adult: Secondary | ICD-10-CM | POA: Diagnosis not present

## 2014-09-10 DIAGNOSIS — G894 Chronic pain syndrome: Secondary | ICD-10-CM | POA: Diagnosis not present

## 2014-09-10 DIAGNOSIS — E6609 Other obesity due to excess calories: Secondary | ICD-10-CM | POA: Diagnosis not present

## 2014-10-18 ENCOUNTER — Other Ambulatory Visit (HOSPITAL_COMMUNITY): Payer: Self-pay | Admitting: Internal Medicine

## 2014-10-18 DIAGNOSIS — G894 Chronic pain syndrome: Secondary | ICD-10-CM | POA: Diagnosis not present

## 2014-10-18 DIAGNOSIS — M8580 Other specified disorders of bone density and structure, unspecified site: Secondary | ICD-10-CM

## 2014-10-18 DIAGNOSIS — Z6831 Body mass index (BMI) 31.0-31.9, adult: Secondary | ICD-10-CM | POA: Diagnosis not present

## 2014-10-23 ENCOUNTER — Other Ambulatory Visit (HOSPITAL_COMMUNITY): Payer: Medicare Other

## 2014-11-28 ENCOUNTER — Other Ambulatory Visit (HOSPITAL_COMMUNITY): Payer: Self-pay | Admitting: Internal Medicine

## 2014-11-28 DIAGNOSIS — M8580 Other specified disorders of bone density and structure, unspecified site: Secondary | ICD-10-CM

## 2014-11-28 DIAGNOSIS — Z6831 Body mass index (BMI) 31.0-31.9, adult: Secondary | ICD-10-CM | POA: Diagnosis not present

## 2014-11-28 DIAGNOSIS — I10 Essential (primary) hypertension: Secondary | ICD-10-CM | POA: Diagnosis not present

## 2014-11-28 DIAGNOSIS — M1991 Primary osteoarthritis, unspecified site: Secondary | ICD-10-CM | POA: Diagnosis not present

## 2014-11-28 DIAGNOSIS — F419 Anxiety disorder, unspecified: Secondary | ICD-10-CM | POA: Diagnosis not present

## 2014-11-28 DIAGNOSIS — G894 Chronic pain syndrome: Secondary | ICD-10-CM | POA: Diagnosis not present

## 2014-11-28 DIAGNOSIS — M859 Disorder of bone density and structure, unspecified: Secondary | ICD-10-CM

## 2014-11-28 DIAGNOSIS — E6609 Other obesity due to excess calories: Secondary | ICD-10-CM | POA: Diagnosis not present

## 2014-12-05 ENCOUNTER — Ambulatory Visit (HOSPITAL_COMMUNITY)
Admission: RE | Admit: 2014-12-05 | Discharge: 2014-12-05 | Disposition: A | Discharge status: Home / Self Care | Payer: Medicare Other | Source: Ambulatory Visit | Attending: Internal Medicine | Admitting: Internal Medicine

## 2014-12-05 DIAGNOSIS — M859 Disorder of bone density and structure, unspecified: Secondary | ICD-10-CM

## 2014-12-05 DIAGNOSIS — M8580 Other specified disorders of bone density and structure, unspecified site: Secondary | ICD-10-CM | POA: Diagnosis present

## 2015-01-13 DIAGNOSIS — I10 Essential (primary) hypertension: Secondary | ICD-10-CM | POA: Diagnosis not present

## 2015-01-13 DIAGNOSIS — F419 Anxiety disorder, unspecified: Secondary | ICD-10-CM | POA: Diagnosis not present

## 2015-01-13 DIAGNOSIS — E6609 Other obesity due to excess calories: Secondary | ICD-10-CM | POA: Diagnosis not present

## 2015-01-13 DIAGNOSIS — G894 Chronic pain syndrome: Secondary | ICD-10-CM | POA: Diagnosis not present

## 2015-01-13 DIAGNOSIS — Z6831 Body mass index (BMI) 31.0-31.9, adult: Secondary | ICD-10-CM | POA: Diagnosis not present

## 2015-02-13 DIAGNOSIS — H04129 Dry eye syndrome of unspecified lacrimal gland: Secondary | ICD-10-CM | POA: Diagnosis not present

## 2015-02-21 DIAGNOSIS — Z79899 Other long term (current) drug therapy: Secondary | ICD-10-CM | POA: Diagnosis not present

## 2015-02-21 DIAGNOSIS — I10 Essential (primary) hypertension: Secondary | ICD-10-CM | POA: Diagnosis not present

## 2015-02-21 DIAGNOSIS — G894 Chronic pain syndrome: Secondary | ICD-10-CM | POA: Diagnosis not present

## 2015-02-21 DIAGNOSIS — R5383 Other fatigue: Secondary | ICD-10-CM | POA: Diagnosis not present

## 2015-02-21 DIAGNOSIS — E063 Autoimmune thyroiditis: Secondary | ICD-10-CM | POA: Diagnosis not present

## 2015-02-21 DIAGNOSIS — Z6831 Body mass index (BMI) 31.0-31.9, adult: Secondary | ICD-10-CM | POA: Diagnosis not present

## 2015-03-14 ENCOUNTER — Other Ambulatory Visit (HOSPITAL_COMMUNITY): Payer: Self-pay | Admitting: Internal Medicine

## 2015-03-14 DIAGNOSIS — Z1231 Encounter for screening mammogram for malignant neoplasm of breast: Secondary | ICD-10-CM

## 2015-03-20 ENCOUNTER — Ambulatory Visit (HOSPITAL_COMMUNITY)
Admission: RE | Admit: 2015-03-20 | Discharge: 2015-03-20 | Disposition: A | Discharge status: Home / Self Care | Payer: Medicare Other | Source: Ambulatory Visit | Attending: Internal Medicine | Admitting: Internal Medicine

## 2015-03-20 DIAGNOSIS — Z1231 Encounter for screening mammogram for malignant neoplasm of breast: Secondary | ICD-10-CM | POA: Diagnosis not present

## 2015-03-28 DIAGNOSIS — R5383 Other fatigue: Secondary | ICD-10-CM | POA: Diagnosis not present

## 2015-04-10 DIAGNOSIS — G894 Chronic pain syndrome: Secondary | ICD-10-CM | POA: Diagnosis not present

## 2015-04-10 DIAGNOSIS — E6609 Other obesity due to excess calories: Secondary | ICD-10-CM | POA: Diagnosis not present

## 2015-04-10 DIAGNOSIS — Z1389 Encounter for screening for other disorder: Secondary | ICD-10-CM | POA: Diagnosis not present

## 2015-04-10 DIAGNOSIS — I10 Essential (primary) hypertension: Secondary | ICD-10-CM | POA: Diagnosis not present

## 2015-04-10 DIAGNOSIS — L209 Atopic dermatitis, unspecified: Secondary | ICD-10-CM | POA: Diagnosis not present

## 2015-04-10 DIAGNOSIS — Z6832 Body mass index (BMI) 32.0-32.9, adult: Secondary | ICD-10-CM | POA: Diagnosis not present

## 2015-10-06 DIAGNOSIS — R51 Headache: Secondary | ICD-10-CM | POA: Diagnosis not present

## 2015-10-06 DIAGNOSIS — G894 Chronic pain syndrome: Secondary | ICD-10-CM | POA: Diagnosis not present

## 2015-10-06 DIAGNOSIS — M3210 Systemic lupus erythematosus, organ or system involvement unspecified: Secondary | ICD-10-CM | POA: Diagnosis not present

## 2015-10-06 DIAGNOSIS — Z6833 Body mass index (BMI) 33.0-33.9, adult: Secondary | ICD-10-CM | POA: Diagnosis not present

## 2015-10-06 DIAGNOSIS — Z1389 Encounter for screening for other disorder: Secondary | ICD-10-CM | POA: Diagnosis not present

## 2015-10-06 DIAGNOSIS — J329 Chronic sinusitis, unspecified: Secondary | ICD-10-CM | POA: Diagnosis not present

## 2015-10-07 ENCOUNTER — Other Ambulatory Visit (HOSPITAL_COMMUNITY): Payer: Self-pay | Admitting: Internal Medicine

## 2015-10-07 DIAGNOSIS — G44029 Chronic cluster headache, not intractable: Secondary | ICD-10-CM

## 2015-10-17 ENCOUNTER — Ambulatory Visit (HOSPITAL_COMMUNITY)
Admission: RE | Admit: 2015-10-17 | Discharge: 2015-10-17 | Disposition: A | Discharge status: Home / Self Care | Payer: Medicare Other | Source: Ambulatory Visit | Attending: Internal Medicine | Admitting: Internal Medicine

## 2015-10-17 DIAGNOSIS — G44029 Chronic cluster headache, not intractable: Secondary | ICD-10-CM

## 2015-10-17 DIAGNOSIS — I639 Cerebral infarction, unspecified: Secondary | ICD-10-CM | POA: Diagnosis not present

## 2015-10-17 DIAGNOSIS — R51 Headache: Secondary | ICD-10-CM | POA: Insufficient documentation

## 2015-10-17 DIAGNOSIS — Z1389 Encounter for screening for other disorder: Secondary | ICD-10-CM | POA: Insufficient documentation

## 2015-11-19 DIAGNOSIS — Z1389 Encounter for screening for other disorder: Secondary | ICD-10-CM | POA: Diagnosis not present

## 2015-11-19 DIAGNOSIS — G894 Chronic pain syndrome: Secondary | ICD-10-CM | POA: Diagnosis not present

## 2015-11-19 DIAGNOSIS — Z6833 Body mass index (BMI) 33.0-33.9, adult: Secondary | ICD-10-CM | POA: Diagnosis not present

## 2015-11-19 DIAGNOSIS — M1991 Primary osteoarthritis, unspecified site: Secondary | ICD-10-CM | POA: Diagnosis not present

## 2015-11-19 DIAGNOSIS — M3219 Other organ or system involvement in systemic lupus erythematosus: Secondary | ICD-10-CM | POA: Diagnosis not present

## 2015-11-27 DIAGNOSIS — K922 Gastrointestinal hemorrhage, unspecified: Secondary | ICD-10-CM | POA: Diagnosis not present

## 2015-11-27 DIAGNOSIS — Z1389 Encounter for screening for other disorder: Secondary | ICD-10-CM | POA: Diagnosis not present

## 2015-11-27 DIAGNOSIS — M1991 Primary osteoarthritis, unspecified site: Secondary | ICD-10-CM | POA: Diagnosis not present

## 2015-11-27 DIAGNOSIS — R197 Diarrhea, unspecified: Secondary | ICD-10-CM | POA: Diagnosis not present

## 2015-11-27 DIAGNOSIS — Z6832 Body mass index (BMI) 32.0-32.9, adult: Secondary | ICD-10-CM | POA: Diagnosis not present

## 2015-11-27 DIAGNOSIS — G894 Chronic pain syndrome: Secondary | ICD-10-CM | POA: Diagnosis not present

## 2015-11-28 DIAGNOSIS — Z1389 Encounter for screening for other disorder: Secondary | ICD-10-CM | POA: Diagnosis not present

## 2015-11-28 DIAGNOSIS — K922 Gastrointestinal hemorrhage, unspecified: Secondary | ICD-10-CM | POA: Diagnosis not present

## 2015-11-30 ENCOUNTER — Emergency Department (HOSPITAL_COMMUNITY): Payer: Medicare Other

## 2015-11-30 ENCOUNTER — Encounter (HOSPITAL_COMMUNITY): Payer: Self-pay | Admitting: Emergency Medicine

## 2015-11-30 ENCOUNTER — Emergency Department (HOSPITAL_COMMUNITY)
Admission: EM | Admit: 2015-11-30 | Discharge: 2015-11-30 | Disposition: A | Discharge status: Home / Self Care | Payer: Medicare Other | Attending: Emergency Medicine | Admitting: Emergency Medicine

## 2015-11-30 DIAGNOSIS — R1084 Generalized abdominal pain: Secondary | ICD-10-CM | POA: Diagnosis not present

## 2015-11-30 DIAGNOSIS — E785 Hyperlipidemia, unspecified: Secondary | ICD-10-CM | POA: Diagnosis not present

## 2015-11-30 DIAGNOSIS — R1010 Upper abdominal pain, unspecified: Secondary | ICD-10-CM | POA: Diagnosis not present

## 2015-11-30 DIAGNOSIS — Z79899 Other long term (current) drug therapy: Secondary | ICD-10-CM | POA: Insufficient documentation

## 2015-11-30 DIAGNOSIS — E119 Type 2 diabetes mellitus without complications: Secondary | ICD-10-CM | POA: Diagnosis not present

## 2015-11-30 DIAGNOSIS — Z87891 Personal history of nicotine dependence: Secondary | ICD-10-CM | POA: Insufficient documentation

## 2015-11-30 DIAGNOSIS — R110 Nausea: Secondary | ICD-10-CM | POA: Diagnosis not present

## 2015-11-30 DIAGNOSIS — I10 Essential (primary) hypertension: Secondary | ICD-10-CM | POA: Diagnosis not present

## 2015-11-30 DIAGNOSIS — R109 Unspecified abdominal pain: Secondary | ICD-10-CM

## 2015-11-30 HISTORY — DX: Reserved for concepts with insufficient information to code with codable children: IMO0002

## 2015-11-30 HISTORY — DX: Systemic lupus erythematosus, unspecified: M32.9

## 2015-11-30 LAB — LIPASE, BLOOD: Lipase: 29 U/L (ref 11–51)

## 2015-11-30 LAB — COMPREHENSIVE METABOLIC PANEL
ALK PHOS: 80 U/L (ref 38–126)
ALT: 30 U/L (ref 14–54)
ANION GAP: 7 (ref 5–15)
AST: 29 U/L (ref 15–41)
Albumin: 3.3 g/dL — ABNORMAL LOW (ref 3.5–5.0)
BUN: 14 mg/dL (ref 6–20)
CALCIUM: 9.1 mg/dL (ref 8.9–10.3)
CO2: 27 mmol/L (ref 22–32)
Chloride: 106 mmol/L (ref 101–111)
Creatinine, Ser: 1.01 mg/dL — ABNORMAL HIGH (ref 0.44–1.00)
GFR, EST NON AFRICAN AMERICAN: 55 mL/min — AB (ref >60)
Glucose, Bld: 111 mg/dL — ABNORMAL HIGH (ref 65–99)
Potassium: 3.7 mmol/L (ref 3.5–5.1)
SODIUM: 140 mmol/L (ref 135–145)
Total Bilirubin: 0.4 mg/dL (ref 0.3–1.2)
Total Protein: 7.4 g/dL (ref 6.5–8.1)

## 2015-11-30 LAB — CBC
HCT: 31.8 % — ABNORMAL LOW (ref 36.0–46.0)
Hemoglobin: 10 g/dL — ABNORMAL LOW (ref 12.0–15.0)
MCH: 28.6 pg (ref 26.0–34.0)
MCHC: 31.4 g/dL (ref 30.0–36.0)
MCV: 90.9 fL (ref 78.0–100.0)
Platelets: 175 10*3/uL (ref 150–400)
RBC: 3.5 MIL/uL — AB (ref 3.87–5.11)
RDW: 13.8 % (ref 11.5–15.5)
WBC: 4.9 10*3/uL (ref 4.0–10.5)

## 2015-11-30 LAB — URINALYSIS, ROUTINE W REFLEX MICROSCOPIC
Bilirubin Urine: NEGATIVE
Glucose, UA: NEGATIVE mg/dL
Hgb urine dipstick: NEGATIVE
Ketones, ur: NEGATIVE mg/dL
LEUKOCYTES UA: NEGATIVE
NITRITE: NEGATIVE
PH: 5 (ref 5.0–8.0)
Protein, ur: NEGATIVE mg/dL
SPECIFIC GRAVITY, URINE: 1.005 (ref 1.005–1.030)

## 2015-11-30 MED ORDER — MORPHINE SULFATE (PF) 4 MG/ML IV SOLN
6.0000 mg | Freq: Once | INTRAVENOUS | Status: AC
  Administered 2015-11-30: 6 mg via INTRAVENOUS
  Filled 2015-11-30: qty 2

## 2015-11-30 MED ORDER — SODIUM CHLORIDE 0.9 % IV SOLN
1000.0000 mL | Freq: Once | INTRAVENOUS | Status: AC
  Administered 2015-11-30: 1000 mL via INTRAVENOUS

## 2015-11-30 MED ORDER — ONDANSETRON HCL 4 MG/2ML IJ SOLN
4.0000 mg | Freq: Once | INTRAMUSCULAR | Status: AC
  Administered 2015-11-30: 4 mg via INTRAVENOUS
  Filled 2015-11-30: qty 2

## 2015-11-30 MED ORDER — SODIUM CHLORIDE 0.9 % IV SOLN
1000.0000 mL | INTRAVENOUS | Status: DC
  Administered 2015-11-30: 1000 mL via INTRAVENOUS

## 2015-11-30 MED ORDER — MORPHINE SULFATE (PF) 4 MG/ML IV SOLN
8.0000 mg | Freq: Once | INTRAVENOUS | Status: AC
  Administered 2015-11-30: 8 mg via INTRAVENOUS
  Filled 2015-11-30: qty 2

## 2015-11-30 MED ORDER — ONDANSETRON 8 MG PO TBDP
8.0000 mg | ORAL_TABLET | Freq: Three times a day (TID) | ORAL | Status: DC | PRN
Start: 2015-11-30 — End: 2018-04-24

## 2015-11-30 NOTE — ED Provider Notes (Signed)
CSN: [id number]     Arrival date & time 11/30/15  [id number] History  By signing my name below, I, Arianna Nassar, attest that this documentation has been prepared under the direction and in the presence of Jola Schmidt, MD. Electronically Signed: Julien Nordmann, ED Scribe. 11/30/2015. 9:34 AM.    Chief Complaint  Patient presents with  . Abdominal Pain      The history is provided by the patient. No language interpreter was used.   HPI Comments: [name] is a 70 y.o. female who has a PMHx of cholecystectomy, GERD, DM, thyroid disease and fibromyalgia presents to the Emergency Department complaining of constant, gradual worsening, moderate, generalized abdominal pain with associated loss of appetite, nausea and diarrhea onset 5 days ago. Pt says she had a slight fever of 99. She has been taking hydromorphone for her fibromyalgia to alleviate the generalized body aches she is having but has not been given any relief. Pt reports having rectal bleeding about 4 days ago that has been gradually improving. She denies hx of diverticulosis.  Past Medical History  Diagnosis Date  . Other chronic pain 12/16/2010  . Unspecified essential hypertension 12/16/2010  . Other and unspecified hyperlipidemia 12/16/2010  . GERD (gastroesophageal reflux disease)   . Diabetes mellitus   . Thyroid disease   . Fibromyalgia    Past Surgical History  Procedure Laterality Date  . Cholecystectomy    . Spinal fixation surgery    . Abdominal hysterectomy  30 yrs ago  . Cataract extraction w/phaco  09/20/2011    Procedure: CATARACT EXTRACTION PHACO AND INTRAOCULAR LENS PLACEMENT (IOC);  Surgeon: Tonny Branch, MD;  Location: [facility];  Service: Ophthalmology;  Laterality: Left;  CDE:9:14  . Cataract extraction w/phaco  10/04/2011    Procedure: CATARACT EXTRACTION PHACO AND INTRAOCULAR LENS PLACEMENT (IOC);  Surgeon: Tonny Branch, MD;  Location: [facility];  Service: Ophthalmology;  Laterality: Right;  CDE:11.66   Family History   Problem Relation Age of Onset  . Anesthesia problems Neg Hx   . Hypotension Neg Hx   . Malignant hyperthermia Neg Hx   . Pseudochol deficiency Neg Hx    Social History  Substance Use Topics  . Smoking status: Former Smoker -- 0.25 packs/day for 1 years    Types: Cigarettes  . Smokeless tobacco: Former Systems developer    Quit date: 09/14/1964  . Alcohol Use: No   OB History    No data available     Review of Systems  A complete 10 system review of systems was obtained and all systems are negative except as noted in the HPI and PMH.    Allergies  Sulfa antibiotics and Cymbalta  Home Medications   Prior to Admission medications   Medication Sig Start Date End Date Taking? Authorizing Provider  bisoprolol (ZEBETA) 5 MG tablet Take 5 mg by mouth daily.      Historical Provider, MD  furosemide (LASIX) 20 MG tablet Take 1 tablet (20 mg total) by mouth daily. 01/14/14   Orpah Greek, MD  HYDROmorphone (DILAUDID) 8 MG tablet Take 1-2 tablets (8-16 mg total) by mouth every 4 (four) hours as needed. pain 10/01/12   Dorie Rank, MD  levothyroxine (SYNTHROID, LEVOTHROID) 25 MCG tablet Take 25 mcg by mouth daily before breakfast.    Historical Provider, MD  levothyroxine (SYNTHROID, LEVOTHROID) 88 MCG tablet Take 88 mcg by mouth daily before breakfast.    Historical Provider, MD  metoCLOPramide (REGLAN) 5 MG tablet Take 5 mg by  mouth 4 (four) times daily.      Historical Provider, MD  potassium chloride SA (K-DUR,KLOR-CON) 20 MEQ tablet Take 1 tablet (20 mEq total) by mouth daily. 01/14/14   Orpah Greek, MD  pregabalin (LYRICA) 100 MG capsule Take 100 mg by mouth 3 (three) times daily.    Historical Provider, MD  simvastatin (ZOCOR) 40 MG tablet Take 40 mg by mouth every evening.      Historical Provider, MD   Triage vitals: BP 129/89 mmHg  Pulse 87  Temp(Src) 98.2 F (36.8 C) (Oral)  Resp 18  Ht 5' (1.524 m)  Wt 158 lb (71.668 kg)  BMI 30.86 kg/m2  SpO2 97% Physical Exam   Constitutional: She is oriented to person, place, and time. She appears well-developed and well-nourished.  HENT:  Head: Normocephalic.  Eyes: EOM are normal.  Neck: Normal range of motion.  Pulmonary/Chest: Effort normal.  Abdominal: She exhibits no distension. There is tenderness.  Mild generalized abdominal tenderness without guarding or rebound  Musculoskeletal: Normal range of motion.  Neurological: She is alert and oriented to person, place, and time.  Psychiatric: She has a normal mood and affect.  Nursing note and vitals reviewed.   ED Course  Procedures  DIAGNOSTIC STUDIES: Oxygen Saturation is 97% on RA, normal by my interpretation.  COORDINATION OF CARE:  9:33 AM Will order DG of abdomen. Discussed treatment plan which includes zofran with pt at bedside and pt agreed to plan.  Labs Review Labs Reviewed  CBC - Abnormal; Notable for the following:    RBC 3.50 (*)    Hemoglobin 10.0 (*)    HCT 31.8 (*)    All other components within normal limits  COMPREHENSIVE METABOLIC PANEL - Abnormal; Notable for the following:    Glucose, Bld 111 (*)    Creatinine, Ser 1.01 (*)    Albumin 3.3 (*)    GFR calc non Af Amer 55 (*)    All other components within normal limits  LIPASE, BLOOD  URINALYSIS, ROUTINE W REFLEX MICROSCOPIC (NOT AT [facility])    Imaging Review Dg Abd 2 Views  11/30/2015  CLINICAL DATA:  Under abdominal pain, diarrhea starting Thursday, weakness EXAM: ABDOMEN - 2 VIEW COMPARISON:  05/18/2009. FINDINGS: There is normal small bowel gas pattern. Moderate colonic gas and nonspecific colonic air-fluid levels are noted. Diarrhea cannot be excluded. Postcholecystectomy surgical clips are noted. IMPRESSION: Normal small bowel gas pattern. No free abdominal air. Moderate colonic gas an nonspecific colonic air-fluid levels. Diarrhea cannot be excluded. Postcholecystectomy surgical clips. Electronically Signed   By: Lahoma Crocker M.D.   On: 11/30/2015 10:22   I have  personally reviewed and evaluated these images and lab results as part of my medical decision-making.   EKG Interpretation None      MDM   Final diagnoses:  Nausea  Abdominal pain    12:49 PM Patient feels much better.  She's been hydrated in the emergency department.  Blood pressure and heart rate are good.  Labs without significant abnormality.  Likely diarrheal episode.  She'll continue her course of antibiotics that she was started on by her primary care physician.  Addition of Zofran for nausea.  Encouraged ongoing oral hydration at home.  She understands return to the ER for new or worsening symptoms.  No tenderness on examination.  I personally performed the services described in this documentation, which was scribed in my presence. The recorded information has been reviewed and is accurate.  Jola Schmidt, MD 11/30/15 1250

## 2015-11-30 NOTE — Discharge Instructions (Signed)

## 2015-11-30 NOTE — ED Notes (Addendum)
Pt attempted to collect urine sample, unsuccessful. Did have another bout of diarrhea.

## 2015-11-30 NOTE — ED Notes (Signed)
Pt states she has been having abdominal pain with n/v/d for the past 4 days.  Went to see Dr. Gerarda Fraction and had some tests done but continues to have pain.  States she had blood in her stool 3 days ago but none currently.

## 2015-12-09 ENCOUNTER — Encounter (INDEPENDENT_AMBULATORY_CARE_PROVIDER_SITE_OTHER): Payer: Self-pay | Admitting: *Deleted

## 2016-01-05 ENCOUNTER — Encounter (INDEPENDENT_AMBULATORY_CARE_PROVIDER_SITE_OTHER): Payer: Self-pay | Admitting: *Deleted

## 2016-01-05 ENCOUNTER — Encounter (INDEPENDENT_AMBULATORY_CARE_PROVIDER_SITE_OTHER): Payer: Self-pay

## 2016-01-05 ENCOUNTER — Ambulatory Visit (INDEPENDENT_AMBULATORY_CARE_PROVIDER_SITE_OTHER): Payer: Medicare Other | Admitting: Internal Medicine

## 2016-01-05 ENCOUNTER — Encounter (INDEPENDENT_AMBULATORY_CARE_PROVIDER_SITE_OTHER): Payer: Self-pay | Admitting: Internal Medicine

## 2016-01-05 ENCOUNTER — Other Ambulatory Visit (INDEPENDENT_AMBULATORY_CARE_PROVIDER_SITE_OTHER): Payer: Self-pay | Admitting: Internal Medicine

## 2016-01-05 VITALS — BP 120/58 | HR 56 | Temp 97.7°F | Ht 60.0 in | Wt 159.2 lb

## 2016-01-05 DIAGNOSIS — K625 Hemorrhage of anus and rectum: Secondary | ICD-10-CM

## 2016-01-05 DIAGNOSIS — R197 Diarrhea, unspecified: Secondary | ICD-10-CM

## 2016-01-05 DIAGNOSIS — E039 Hypothyroidism, unspecified: Secondary | ICD-10-CM | POA: Insufficient documentation

## 2016-01-05 DIAGNOSIS — M797 Fibromyalgia: Secondary | ICD-10-CM | POA: Diagnosis not present

## 2016-01-05 DIAGNOSIS — A09 Infectious gastroenteritis and colitis, unspecified: Secondary | ICD-10-CM | POA: Diagnosis not present

## 2016-01-05 NOTE — Telephone Encounter (Signed)
This encounter was created in error - please disregard.

## 2016-01-05 NOTE — Progress Notes (Signed)
Subjective:    Patient ID: [name], female    DOB: [birth year], 70 y.o.   MRN: [id number]  HPI Referred by Dr. Gerarda Fraction for rectal bleeding. The bleeding started about a month ago. She bleed off an on for 5 days. Diarrhea was associated with her symptoms.  She describes as a lot.She said she went to the ED. She was given an Rx for nausea. Low grade temp of 99. She had seen Dr. Jannette Spanner earlier and empirically treated with Cipro and Flagyl. She feels better. No abdominal pain at this time. Appetite is good. She does have some nausea.  Takes Dilaudid three times a day for fibromyalgia.   Stools studies 11/27/2015 for salmonella/shigella, campylobacter, c diff, and e coli negative.    11/27/2015 H and H 11.8 and 35.9, MCV 88, total bili 0.3, ALP 107, AST 31, ALT 25.  03/03/2004: Diagnostic colonoscopy. Dr. Gala Romney  INDICATIONS FOR PROCEDURE: The patient is a 69 year old lady with multiple medical problems admitted to the hospital with right-sided abdominal pain. She has had intermittent small volume hematochezia and has anemia. Colonoscopy is now being done. This approach has been discussed with the patient previously and, again, today at the bedside. The potential risks, benefits, and alternatives have been reviewed; questions answered. Please see the documentation in the medical record.  IMPRESSION: 1. Anal canal hemorrhoids; otherwise normal rectum. 2. Normal colon.  DISCUSSION: I suspected that the patient has experienced low-volume hematochezia from hemorrhoids.   CBC    Component Value Date/Time   WBC 4.9 11/30/2015 0920   RBC 3.50* 11/30/2015 0920   HGB 10.0* 11/30/2015 0920   HCT 31.8* 11/30/2015 0920   PLT 175 11/30/2015 0920   MCV 90.9 11/30/2015 0920   MCH 28.6 11/30/2015 0920   MCHC 31.4 11/30/2015 0920   RDW 13.8 11/30/2015 0920   LYMPHSABS 1.1 01/14/2014 1432   MONOABS 0.2 01/14/2014 1432   EOSABS 0.1 01/14/2014 1432   BASOSABS 0.0 01/14/2014  1432   Hepatic Function Panel     Component Value Date/Time   PROT 7.4 11/30/2015 0920   ALBUMIN 3.3* 11/30/2015 0920   AST 29 11/30/2015 0920   ALT 30 11/30/2015 0920   ALKPHOS 80 11/30/2015 0920   BILITOT 0.4 11/30/2015 0920        Review of Systems Past Medical History  Diagnosis Date  . Other chronic pain 12/16/2010  . Unspecified essential hypertension 12/16/2010  . Other and unspecified hyperlipidemia 12/16/2010  . GERD (gastroesophageal reflux disease)   . Diabetes mellitus   . Thyroid disease   . Fibromyalgia   . Lupus [facility])     Past Surgical History  Procedure Laterality Date  . Cholecystectomy    . Spinal fixation surgery    . Abdominal hysterectomy  30 yrs ago  . Cataract extraction w/phaco  09/20/2011    Procedure: CATARACT EXTRACTION PHACO AND INTRAOCULAR LENS PLACEMENT (IOC);  Surgeon: Tonny Branch, MD;  Location: [facility];  Service: Ophthalmology;  Laterality: Left;  CDE:9:14  . Cataract extraction w/phaco  10/04/2011    Procedure: CATARACT EXTRACTION PHACO AND INTRAOCULAR LENS PLACEMENT (IOC);  Surgeon: Tonny Branch, MD;  Location: [facility];  Service: Ophthalmology;  Laterality: Right;  CDE:11.66    Allergies  Allergen Reactions  . Sulfa Antibiotics Anaphylaxis and Swelling  . Cymbalta [Duloxetine Hcl] Other (See Comments)    "felt funny"    Current Outpatient Prescriptions on File Prior to Visit  Medication Sig Dispense Refill  . bisoprolol (ZEBETA) 5 MG  tablet Take 5 mg by mouth daily. Reported on 11/30/2015    . HYDROmorphone (DILAUDID) 8 MG tablet Take 1-2 tablets (8-16 mg total) by mouth every 4 (four) hours as needed. pain 12 tablet 0  . levothyroxine (SYNTHROID, LEVOTHROID) 88 MCG tablet Take 88 mcg by mouth daily before breakfast.    . metoCLOPramide (REGLAN) 5 MG tablet Take 5 mg by mouth 4 (four) times daily -  before meals and at bedtime.    . pregabalin (LYRICA) 100 MG capsule Take 100 mg by mouth 3 (three) times daily.    . simvastatin (ZOCOR)  40 MG tablet Take 40 mg by mouth every evening.      . ciprofloxacin (CIPRO) 500 MG tablet Take 1 tablet by mouth 2 (two) times daily. Reported on 01/05/2016    . diphenoxylate-atropine (LOMOTIL) 2.5-0.025 MG tablet Take 1 tablet by mouth 4 (four) times daily as needed for diarrhea or loose stools. Reported on 01/05/2016    . furosemide (LASIX) 20 MG tablet Take 1 tablet (20 mg total) by mouth daily. (Patient not taking: Reported on 11/30/2015) 7 tablet 0  . metroNIDAZOLE (FLAGYL) 500 MG tablet Take 1 tablet by mouth 4 (four) times daily. Reported on 01/05/2016    . ondansetron (ZOFRAN ODT) 8 MG disintegrating tablet Take 1 tablet (8 mg total) by mouth every 8 (eight) hours as needed for nausea or vomiting. (Patient not taking: Reported on 01/05/2016) 12 tablet 0   No current facility-administered medications on file prior to visit.        Objective:   Physical Exam Blood pressure 120/58, pulse 56, temperature 97.7 F (36.5 C), height 5' (1.524 m), weight 159 lb 3.2 oz (72.213 kg).  Alert and oriented. Skin warm and dry. Oral mucosa is moist.   . Sclera anicteric, conjunctivae is pink. Thyroid not enlarged. No cervical lymphadenopathy. Lungs clear. Heart regular rate and rhythm.  Abdomen is soft. Bowel sounds are positive. No hepatomegaly. No abdominal masses felt. No tenderness.  No edema to lower extremities.         Assessment & Plan:  Rectal bleeding and diarrhea. ? Infectious. She was covered with Cipro and Flagyl. Symptoms resolved after 5-6 days. Her last colonoscopy was in 2005. Needs screening colonoscopy with propofol. Hx of rectal bleeding in the past and underwent a colonoscopy in 2005.

## 2016-01-05 NOTE — Patient Instructions (Signed)
Colonoscopy.  The risks and benefits such as perforation, bleeding, and infection were reviewed with the patient and is agreeable. 

## 2016-01-06 ENCOUNTER — Encounter (INDEPENDENT_AMBULATORY_CARE_PROVIDER_SITE_OTHER): Payer: Self-pay | Admitting: *Deleted

## 2016-01-06 ENCOUNTER — Other Ambulatory Visit (INDEPENDENT_AMBULATORY_CARE_PROVIDER_SITE_OTHER): Payer: Self-pay | Admitting: *Deleted

## 2016-01-06 MED ORDER — PEG 3350-KCL-NA BICARB-NACL 420 G PO SOLR: 4000.0000 mL | Freq: Once | ORAL | Status: DC

## 2016-01-06 NOTE — Telephone Encounter (Signed)
Patient needs trilyte 

## 2016-01-19 ENCOUNTER — Telehealth (INDEPENDENT_AMBULATORY_CARE_PROVIDER_SITE_OTHER): Payer: Self-pay | Admitting: Internal Medicine

## 2016-01-19 ENCOUNTER — Telehealth (INDEPENDENT_AMBULATORY_CARE_PROVIDER_SITE_OTHER): Payer: Self-pay | Admitting: *Deleted

## 2016-01-19 DIAGNOSIS — K219 Gastro-esophageal reflux disease without esophagitis: Secondary | ICD-10-CM

## 2016-01-19 MED ORDER — OMEPRAZOLE 40 MG PO CPDR: 40.0000 mg | DELAYED_RELEASE_CAPSULE | Freq: Every day | ORAL | Status: DC

## 2016-01-19 NOTE — Telephone Encounter (Signed)
Rx for Omeprazole called to her pharmacy. She has a burning sensation in her epigastric region.

## 2016-01-19 NOTE — Telephone Encounter (Signed)
Having some diarrhea but not as bas as before, no blood, lower stomach isburning and hurting, patient wants to know if you can give her some medicine for this  [phone]

## 2016-01-19 NOTE — Telephone Encounter (Signed)
Rx for Omeprazole sent to her pharmacy 

## 2016-01-28 ENCOUNTER — Other Ambulatory Visit (HOSPITAL_COMMUNITY): Payer: Self-pay | Admitting: Internal Medicine

## 2016-01-28 ENCOUNTER — Ambulatory Visit (HOSPITAL_COMMUNITY)
Admission: RE | Admit: 2016-01-28 | Discharge: 2016-01-28 | Disposition: A | Discharge status: Home / Self Care | Payer: Medicare Other | Source: Ambulatory Visit | Attending: Internal Medicine | Admitting: Internal Medicine

## 2016-01-28 DIAGNOSIS — M79601 Pain in right arm: Secondary | ICD-10-CM

## 2016-01-28 DIAGNOSIS — R600 Localized edema: Secondary | ICD-10-CM | POA: Diagnosis not present

## 2016-01-28 DIAGNOSIS — R601 Generalized edema: Secondary | ICD-10-CM | POA: Insufficient documentation

## 2016-01-28 DIAGNOSIS — R938 Abnormal findings on diagnostic imaging of other specified body structures: Secondary | ICD-10-CM | POA: Insufficient documentation

## 2016-01-28 NOTE — Patient Instructions (Signed)
TORILYN BRAD  01/28/2016     @PREFPERIOPPHARMACY @   Your procedure is scheduled on 02/06/2016.  Report to [facility] at 6:15 A.M.  Call this number if you have problems the morning of surgery:  [phone]   Remember:  Do not eat food or drink liquids after midnight.  Take these medicines the morning of surgery with A SIP OF WATER Zebeta, Dilaudid if needed, Synthroid, Reglan if needed, Prilosec. Zofran if needed, Lyrica   Do not wear jewelry, make-up or nail polish.  Do not wear lotions, powders, or perfumes.  You may wear deodorant.  Do not shave 48 hours prior to surgery.  Men may shave face and neck.  Do not bring valuables to the hospital.  [facility] is not responsible for any belongings or valuables.  Contacts, dentures or bridgework may not be worn into surgery.  Leave your suitcase in the car.  After surgery it may be brought to your room.  For patients admitted to the hospital, discharge time will be determined by your treatment team.  Patients discharged the day of surgery will not be allowed to drive home.    Please read over the following fact sheets that you were given. Anesthesia Post-op Instructions     PATIENT INSTRUCTIONS POST-ANESTHESIA  IMMEDIATELY FOLLOWING SURGERY:  Do not drive or operate machinery for the first twenty four hours after surgery.  Do not make any important decisions for twenty four hours after surgery or while taking narcotic pain medications or sedatives.  If you develop intractable nausea and vomiting or a severe headache please notify your doctor immediately.  FOLLOW-UP:  Please make an appointment with your surgeon as instructed. You do not need to follow up with anesthesia unless specifically instructed to do so.  WOUND CARE INSTRUCTIONS (if applicable):  Keep a dry clean dressing on the anesthesia/puncture wound site if there is drainage.  Once the wound has quit draining you may leave it open to air.  Generally you should leave  the bandage intact for twenty four hours unless there is drainage.  If the epidural site drains for more than 36-48 hours please call the anesthesia department.  QUESTIONS?:  Please feel free to call your physician or the hospital operator if you have any questions, and they will be happy to assist you.      Colonoscopy A colonoscopy is an exam to look at the entire large intestine (colon). This exam can help find problems such as tumors, polyps, inflammation, and areas of bleeding. The exam takes about 1 hour.  LET [facility] CARE PROVIDER KNOW ABOUT:   Any allergies you have.  All medicines you are taking, including vitamins, herbs, eye drops, creams, and over-the-counter medicines.  Previous problems you or members of your family have had with the use of anesthetics.  Any blood disorders you have.  Previous surgeries you have had.  Medical conditions you have. RISKS AND COMPLICATIONS  Generally, this is a safe procedure. However, as with any procedure, complications can occur. Possible complications include:  Bleeding.  Tearing or rupture of the colon wall.  Reaction to medicines given during the exam.  Infection (rare). BEFORE THE PROCEDURE   Ask your health care provider about changing or stopping your regular medicines.  You may be prescribed an oral bowel prep. This involves drinking a large amount of medicated liquid, starting the day before your procedure. The liquid will cause you to have multiple loose stools until your stool is almost  clear or light green. This cleans out your colon in preparation for the procedure.  Do not eat or drink anything else once you have started the bowel prep, unless your health care provider tells you it is safe to do so.  Arrange for someone to drive you home after the procedure. PROCEDURE   You will be given medicine to help you relax (sedative).  You will lie on your side with your knees bent.  A long, flexible tube with a  light and camera on the end (colonoscope) will be inserted through the rectum and into the colon. The camera sends video back to a computer screen as it moves through the colon. The colonoscope also releases carbon dioxide gas to inflate the colon. This helps your health care provider see the area better.  During the exam, your health care provider may take a small tissue sample (biopsy) to be examined under a microscope if any abnormalities are found.  The exam is finished when the entire colon has been viewed. AFTER THE PROCEDURE   Do not drive for 24 hours after the exam.  You may have a small amount of blood in your stool.  You may pass moderate amounts of gas and have mild abdominal cramping or bloating. This is caused by the gas used to inflate your colon during the exam.  Ask when your test results will be ready and how you will get your results. Make sure you get your test results.   This information is not intended to replace advice given to you by your health care provider. Make sure you discuss any questions you have with your health care provider.   Document Released: 08/20/2000 Document Revised: 06/13/2013 Document Reviewed: 04/30/2013 Elsevier Interactive Patient Education [employer].

## 2016-02-03 ENCOUNTER — Encounter (HOSPITAL_COMMUNITY): Payer: Self-pay

## 2016-02-03 ENCOUNTER — Encounter (HOSPITAL_COMMUNITY)
Admission: RE | Admit: 2016-02-03 | Discharge: 2016-02-03 | Disposition: A | Discharge status: Home / Self Care | Payer: Medicare Other | Source: Ambulatory Visit | Attending: Internal Medicine | Admitting: Internal Medicine

## 2016-02-03 VITALS — BP 146/72 | HR 61 | Temp 98.5°F | Resp 18 | Ht <= 58 in | Wt 122.1 lb

## 2016-02-03 DIAGNOSIS — R197 Diarrhea, unspecified: Secondary | ICD-10-CM

## 2016-02-03 DIAGNOSIS — M25531 Pain in right wrist: Secondary | ICD-10-CM | POA: Diagnosis present

## 2016-02-03 DIAGNOSIS — Z01818 Encounter for other preprocedural examination: Secondary | ICD-10-CM | POA: Diagnosis not present

## 2016-02-03 DIAGNOSIS — M19041 Primary osteoarthritis, right hand: Secondary | ICD-10-CM | POA: Diagnosis not present

## 2016-02-03 DIAGNOSIS — M19031 Primary osteoarthritis, right wrist: Secondary | ICD-10-CM | POA: Diagnosis not present

## 2016-02-03 DIAGNOSIS — M79641 Pain in right hand: Secondary | ICD-10-CM | POA: Diagnosis not present

## 2016-02-03 DIAGNOSIS — K625 Hemorrhage of anus and rectum: Secondary | ICD-10-CM

## 2016-02-03 LAB — BASIC METABOLIC PANEL
ANION GAP: 7 (ref 5–15)
BUN: 17 mg/dL (ref 6–20)
CALCIUM: 9.2 mg/dL (ref 8.9–10.3)
CO2: 27 mmol/L (ref 22–32)
CREATININE: 0.9 mg/dL (ref 0.44–1.00)
Chloride: 104 mmol/L (ref 101–111)
Glucose, Bld: 93 mg/dL (ref 65–99)
Potassium: 4.1 mmol/L (ref 3.5–5.1)
Sodium: 138 mmol/L (ref 135–145)

## 2016-02-03 LAB — CBC
HCT: 30.4 % — ABNORMAL LOW (ref 36.0–46.0)
Hemoglobin: 9.4 g/dL — ABNORMAL LOW (ref 12.0–15.0)
MCH: 28 pg (ref 26.0–34.0)
MCHC: 30.9 g/dL (ref 30.0–36.0)
MCV: 90.5 fL (ref 78.0–100.0)
PLATELETS: 253 10*3/uL (ref 150–400)
RBC: 3.36 MIL/uL — ABNORMAL LOW (ref 3.87–5.11)
RDW: 13 % (ref 11.5–15.5)
WBC: 3.7 10*3/uL — AB (ref 4.0–10.5)

## 2016-02-05 ENCOUNTER — Other Ambulatory Visit (HOSPITAL_COMMUNITY): Payer: Self-pay | Admitting: Internal Medicine

## 2016-02-06 ENCOUNTER — Encounter (HOSPITAL_COMMUNITY): Admission: RE | Payer: Self-pay | Source: Ambulatory Visit

## 2016-02-06 ENCOUNTER — Ambulatory Visit (HOSPITAL_COMMUNITY): Admission: RE | Admit: 2016-02-06 | Payer: Medicare Other | Source: Ambulatory Visit | Admitting: Internal Medicine

## 2016-02-06 ENCOUNTER — Ambulatory Visit (HOSPITAL_COMMUNITY)
Admission: RE | Admit: 2016-02-06 | Discharge: 2016-02-06 | Disposition: A | Discharge status: Home / Self Care | Payer: Medicare Other | Source: Ambulatory Visit | Attending: Internal Medicine | Admitting: Internal Medicine

## 2016-02-06 ENCOUNTER — Other Ambulatory Visit (HOSPITAL_COMMUNITY): Payer: Self-pay | Admitting: Internal Medicine

## 2016-02-06 DIAGNOSIS — M79641 Pain in right hand: Secondary | ICD-10-CM

## 2016-02-06 DIAGNOSIS — M19031 Primary osteoarthritis, right wrist: Secondary | ICD-10-CM | POA: Diagnosis not present

## 2016-02-06 DIAGNOSIS — M25531 Pain in right wrist: Secondary | ICD-10-CM | POA: Diagnosis not present

## 2016-02-06 DIAGNOSIS — M19041 Primary osteoarthritis, right hand: Secondary | ICD-10-CM | POA: Diagnosis not present

## 2016-02-06 DIAGNOSIS — G8929 Other chronic pain: Secondary | ICD-10-CM

## 2016-02-06 DIAGNOSIS — Z01818 Encounter for other preprocedural examination: Secondary | ICD-10-CM | POA: Insufficient documentation

## 2016-02-06 SURGERY — COLONOSCOPY WITH PROPOFOL
Anesthesia: Monitor Anesthesia Care

## 2016-02-06 NOTE — OR Nursing (Signed)
Patient called this morning and said that she has been vomiting and unable to do prep.  Dr. Laural Golden notified procedure cancelled for now.

## 2016-03-18 DIAGNOSIS — E6609 Other obesity due to excess calories: Secondary | ICD-10-CM | POA: Diagnosis not present

## 2016-03-18 DIAGNOSIS — Z0000 Encounter for general adult medical examination without abnormal findings: Secondary | ICD-10-CM | POA: Diagnosis not present

## 2016-03-18 DIAGNOSIS — Z1389 Encounter for screening for other disorder: Secondary | ICD-10-CM | POA: Diagnosis not present

## 2016-03-18 DIAGNOSIS — Z6830 Body mass index (BMI) 30.0-30.9, adult: Secondary | ICD-10-CM | POA: Diagnosis not present

## 2016-03-18 DIAGNOSIS — G894 Chronic pain syndrome: Secondary | ICD-10-CM | POA: Diagnosis not present

## 2016-05-05 DIAGNOSIS — Z1389 Encounter for screening for other disorder: Secondary | ICD-10-CM | POA: Diagnosis not present

## 2016-05-05 DIAGNOSIS — E748 Other specified disorders of carbohydrate metabolism: Secondary | ICD-10-CM | POA: Diagnosis not present

## 2016-05-05 DIAGNOSIS — E785 Hyperlipidemia, unspecified: Secondary | ICD-10-CM | POA: Diagnosis not present

## 2016-05-05 DIAGNOSIS — E039 Hypothyroidism, unspecified: Secondary | ICD-10-CM | POA: Diagnosis not present

## 2016-06-18 DIAGNOSIS — Z1389 Encounter for screening for other disorder: Secondary | ICD-10-CM | POA: Diagnosis not present

## 2016-06-18 DIAGNOSIS — G894 Chronic pain syndrome: Secondary | ICD-10-CM | POA: Diagnosis not present

## 2016-06-18 DIAGNOSIS — Z6834 Body mass index (BMI) 34.0-34.9, adult: Secondary | ICD-10-CM | POA: Diagnosis not present

## 2016-06-18 DIAGNOSIS — M2550 Pain in unspecified joint: Secondary | ICD-10-CM | POA: Diagnosis not present

## 2016-06-18 DIAGNOSIS — D229 Melanocytic nevi, unspecified: Secondary | ICD-10-CM | POA: Diagnosis not present

## 2016-07-28 DIAGNOSIS — Z6834 Body mass index (BMI) 34.0-34.9, adult: Secondary | ICD-10-CM | POA: Diagnosis not present

## 2016-07-28 DIAGNOSIS — G894 Chronic pain syndrome: Secondary | ICD-10-CM | POA: Diagnosis not present

## 2016-09-08 DIAGNOSIS — G894 Chronic pain syndrome: Secondary | ICD-10-CM | POA: Diagnosis not present

## 2016-09-08 DIAGNOSIS — Z1389 Encounter for screening for other disorder: Secondary | ICD-10-CM | POA: Diagnosis not present

## 2016-09-08 DIAGNOSIS — G629 Polyneuropathy, unspecified: Secondary | ICD-10-CM | POA: Diagnosis not present

## 2016-09-08 DIAGNOSIS — R1013 Epigastric pain: Secondary | ICD-10-CM | POA: Diagnosis not present

## 2016-09-08 DIAGNOSIS — R109 Unspecified abdominal pain: Secondary | ICD-10-CM | POA: Diagnosis not present

## 2016-09-08 DIAGNOSIS — Z6834 Body mass index (BMI) 34.0-34.9, adult: Secondary | ICD-10-CM | POA: Diagnosis not present

## 2017-01-05 ENCOUNTER — Other Ambulatory Visit (HOSPITAL_COMMUNITY): Payer: Self-pay | Admitting: Internal Medicine

## 2017-01-05 DIAGNOSIS — R1312 Dysphagia, oropharyngeal phase: Secondary | ICD-10-CM

## 2017-01-05 DIAGNOSIS — Z1231 Encounter for screening mammogram for malignant neoplasm of breast: Secondary | ICD-10-CM

## 2017-01-21 ENCOUNTER — Ambulatory Visit (HOSPITAL_COMMUNITY)
Admission: RE | Admit: 2017-01-21 | Discharge: 2017-01-21 | Disposition: A | Discharge status: Home / Self Care | Payer: Medicare Other | Source: Ambulatory Visit | Attending: Internal Medicine | Admitting: Internal Medicine

## 2017-01-21 DIAGNOSIS — R1310 Dysphagia, unspecified: Secondary | ICD-10-CM | POA: Diagnosis not present

## 2017-01-21 DIAGNOSIS — R1312 Dysphagia, oropharyngeal phase: Secondary | ICD-10-CM

## 2017-02-07 ENCOUNTER — Ambulatory Visit (HOSPITAL_COMMUNITY)
Admission: RE | Admit: 2017-02-07 | Discharge: 2017-02-07 | Disposition: A | Discharge status: Home / Self Care | Payer: Medicare Other | Source: Ambulatory Visit | Attending: Internal Medicine | Admitting: Internal Medicine

## 2017-02-07 DIAGNOSIS — Z1231 Encounter for screening mammogram for malignant neoplasm of breast: Secondary | ICD-10-CM

## 2017-03-15 DIAGNOSIS — I10 Essential (primary) hypertension: Secondary | ICD-10-CM | POA: Diagnosis not present

## 2017-03-15 DIAGNOSIS — G894 Chronic pain syndrome: Secondary | ICD-10-CM | POA: Diagnosis not present

## 2017-03-15 DIAGNOSIS — E063 Autoimmune thyroiditis: Secondary | ICD-10-CM | POA: Diagnosis not present

## 2017-03-15 DIAGNOSIS — Z6833 Body mass index (BMI) 33.0-33.9, adult: Secondary | ICD-10-CM | POA: Diagnosis not present

## 2017-03-15 DIAGNOSIS — M1991 Primary osteoarthritis, unspecified site: Secondary | ICD-10-CM | POA: Diagnosis not present

## 2017-04-25 DIAGNOSIS — Z0001 Encounter for general adult medical examination with abnormal findings: Secondary | ICD-10-CM | POA: Diagnosis not present

## 2017-04-25 DIAGNOSIS — Z681 Body mass index (BMI) 19 or less, adult: Secondary | ICD-10-CM | POA: Diagnosis not present

## 2017-04-26 ENCOUNTER — Ambulatory Visit: Payer: Medicare Other

## 2017-06-01 DIAGNOSIS — Z961 Presence of intraocular lens: Secondary | ICD-10-CM | POA: Diagnosis not present

## 2017-11-08 DIAGNOSIS — Z1389 Encounter for screening for other disorder: Secondary | ICD-10-CM | POA: Diagnosis not present

## 2017-11-08 DIAGNOSIS — E785 Hyperlipidemia, unspecified: Secondary | ICD-10-CM | POA: Diagnosis not present

## 2017-11-08 DIAGNOSIS — R946 Abnormal results of thyroid function studies: Secondary | ICD-10-CM | POA: Diagnosis not present

## 2018-02-23 DIAGNOSIS — Z0001 Encounter for general adult medical examination with abnormal findings: Secondary | ICD-10-CM | POA: Diagnosis not present

## 2018-02-23 DIAGNOSIS — Z1389 Encounter for screening for other disorder: Secondary | ICD-10-CM | POA: Diagnosis not present

## 2018-02-23 DIAGNOSIS — Z6827 Body mass index (BMI) 27.0-27.9, adult: Secondary | ICD-10-CM | POA: Diagnosis not present

## 2018-02-23 DIAGNOSIS — M329 Systemic lupus erythematosus, unspecified: Secondary | ICD-10-CM | POA: Diagnosis not present

## 2018-02-23 DIAGNOSIS — E063 Autoimmune thyroiditis: Secondary | ICD-10-CM | POA: Diagnosis not present

## 2018-02-23 DIAGNOSIS — M2550 Pain in unspecified joint: Secondary | ICD-10-CM | POA: Diagnosis not present

## 2018-02-23 DIAGNOSIS — F330 Major depressive disorder, recurrent, mild: Secondary | ICD-10-CM | POA: Diagnosis not present

## 2018-03-23 DIAGNOSIS — Z1211 Encounter for screening for malignant neoplasm of colon: Secondary | ICD-10-CM | POA: Diagnosis not present

## 2018-04-13 ENCOUNTER — Other Ambulatory Visit (HOSPITAL_COMMUNITY): Payer: Self-pay | Admitting: Internal Medicine

## 2018-04-13 DIAGNOSIS — Z1231 Encounter for screening mammogram for malignant neoplasm of breast: Secondary | ICD-10-CM

## 2018-04-24 ENCOUNTER — Emergency Department (HOSPITAL_COMMUNITY)
Admission: EM | Admit: 2018-04-24 | Discharge: 2018-04-24 | Disposition: A | Discharge status: Home / Self Care | Payer: Medicare Other | Attending: Emergency Medicine | Admitting: Emergency Medicine

## 2018-04-24 ENCOUNTER — Encounter (HOSPITAL_COMMUNITY): Payer: Self-pay | Admitting: Emergency Medicine

## 2018-04-24 ENCOUNTER — Emergency Department (HOSPITAL_COMMUNITY): Payer: Medicare Other

## 2018-04-24 ENCOUNTER — Other Ambulatory Visit: Payer: Self-pay

## 2018-04-24 DIAGNOSIS — R1032 Left lower quadrant pain: Secondary | ICD-10-CM

## 2018-04-24 DIAGNOSIS — R110 Nausea: Secondary | ICD-10-CM | POA: Diagnosis not present

## 2018-04-24 DIAGNOSIS — Z87891 Personal history of nicotine dependence: Secondary | ICD-10-CM | POA: Diagnosis not present

## 2018-04-24 DIAGNOSIS — I10 Essential (primary) hypertension: Secondary | ICD-10-CM | POA: Diagnosis not present

## 2018-04-24 DIAGNOSIS — R1013 Epigastric pain: Secondary | ICD-10-CM | POA: Diagnosis present

## 2018-04-24 DIAGNOSIS — R109 Unspecified abdominal pain: Secondary | ICD-10-CM

## 2018-04-24 DIAGNOSIS — R1030 Lower abdominal pain, unspecified: Secondary | ICD-10-CM | POA: Diagnosis not present

## 2018-04-24 DIAGNOSIS — E119 Type 2 diabetes mellitus without complications: Secondary | ICD-10-CM | POA: Insufficient documentation

## 2018-04-24 DIAGNOSIS — N390 Urinary tract infection, site not specified: Secondary | ICD-10-CM

## 2018-04-24 LAB — COMPREHENSIVE METABOLIC PANEL
ALBUMIN: 4 g/dL (ref 3.5–5.0)
ALT: 17 U/L (ref 0–44)
AST: 22 U/L (ref 15–41)
Alkaline Phosphatase: 92 U/L (ref 38–126)
Anion gap: 7 (ref 5–15)
BILIRUBIN TOTAL: 0.6 mg/dL (ref 0.3–1.2)
BUN: 15 mg/dL (ref 8–23)
CO2: 30 mmol/L (ref 22–32)
Calcium: 9.5 mg/dL (ref 8.9–10.3)
Chloride: 105 mmol/L (ref 98–111)
Creatinine, Ser: 1.1 mg/dL — ABNORMAL HIGH (ref 0.44–1.00)
GFR calc Af Amer: 56 mL/min — ABNORMAL LOW (ref >60)
GFR calc non Af Amer: 49 mL/min — ABNORMAL LOW (ref >60)
Glucose, Bld: 108 mg/dL — ABNORMAL HIGH (ref 70–99)
POTASSIUM: 4.2 mmol/L (ref 3.5–5.1)
Sodium: 142 mmol/L (ref 135–145)
Total Protein: 8 g/dL (ref 6.5–8.1)

## 2018-04-24 LAB — CBC
HEMATOCRIT: 35.9 % — AB (ref 36.0–46.0)
Hemoglobin: 11.2 g/dL — ABNORMAL LOW (ref 12.0–15.0)
MCH: 29.6 pg (ref 26.0–34.0)
MCHC: 31.2 g/dL (ref 30.0–36.0)
MCV: 94.7 fL (ref 78.0–100.0)
Platelets: 168 10*3/uL (ref 150–400)
RBC: 3.79 MIL/uL — ABNORMAL LOW (ref 3.87–5.11)
RDW: 12.7 % (ref 11.5–15.5)
WBC: 3.9 10*3/uL — ABNORMAL LOW (ref 4.0–10.5)

## 2018-04-24 LAB — URINALYSIS, ROUTINE W REFLEX MICROSCOPIC
Bilirubin Urine: NEGATIVE
Glucose, UA: NEGATIVE mg/dL
Hgb urine dipstick: NEGATIVE
KETONES UR: NEGATIVE mg/dL
Nitrite: NEGATIVE
PH: 6 (ref 5.0–8.0)
Protein, ur: NEGATIVE mg/dL
SPECIFIC GRAVITY, URINE: 1.004 — AB (ref 1.005–1.030)

## 2018-04-24 LAB — LIPASE, BLOOD: Lipase: 38 U/L (ref 11–51)

## 2018-04-24 MED ORDER — SODIUM CHLORIDE 0.9 % IV BOLUS
500.0000 mL | Freq: Once | INTRAVENOUS | Status: AC
  Administered 2018-04-24: 500 mL via INTRAVENOUS

## 2018-04-24 MED ORDER — NAPROXEN 500 MG PO TABS: 500.0000 mg | ORAL_TABLET | Freq: Two times a day (BID) | ORAL | 0 refills | Status: DC

## 2018-04-24 MED ORDER — ONDANSETRON HCL 4 MG PO TABS: 4.0000 mg | ORAL_TABLET | Freq: Four times a day (QID) | ORAL | 0 refills | Status: DC

## 2018-04-24 MED ORDER — IOPAMIDOL (ISOVUE-300) INJECTION 61%
100.0000 mL | Freq: Once | INTRAVENOUS | Status: AC | PRN
  Administered 2018-04-24: 100 mL via INTRAVENOUS

## 2018-04-24 MED ORDER — DIPHENHYDRAMINE HCL 50 MG/ML IJ SOLN
25.0000 mg | Freq: Once | INTRAMUSCULAR | Status: AC
Start: 2018-04-24 — End: 2018-04-24
  Administered 2018-04-24: 25 mg via INTRAVENOUS
  Filled 2018-04-24: qty 1

## 2018-04-24 MED ORDER — HYDROMORPHONE HCL 1 MG/ML IJ SOLN
0.5000 mg | Freq: Once | INTRAMUSCULAR | Status: AC
  Administered 2018-04-24: 0.5 mg via INTRAVENOUS
  Filled 2018-04-24: qty 1

## 2018-04-24 MED ORDER — ONDANSETRON HCL 4 MG/2ML IJ SOLN
4.0000 mg | Freq: Once | INTRAMUSCULAR | Status: AC
  Administered 2018-04-24: 4 mg via INTRAVENOUS
  Filled 2018-04-24: qty 2

## 2018-04-24 MED ORDER — CEPHALEXIN 500 MG PO CAPS: 500.0000 mg | ORAL_CAPSULE | Freq: Four times a day (QID) | ORAL | 0 refills | Status: DC

## 2018-04-24 NOTE — ED Provider Notes (Signed)
[facility] Emergency Department Provider Note MRN:  [id number]  Arrival date & time: 04/24/18     Chief Complaint   Abdominal Pain   History of Present Illness   [name] is a 72 y.o. year-old female with a history of diabetes, lupus, fibromyalgia, GERD presenting to the ED with chief complaint of abdominal pain.  The pain began gradually 1 to 2 days ago.  The pain is located diffusely, but is worse in the epigastrium.  The pain has become progressively worse, now severe this morning.  Associated with significant nausea, no emesis.  Now with mild headache since the pain became more severe.  Denies recent fever, no vision change, no chest pain, no shortness of breath, no vaginal bleeding or discharge, no dysuria, no diarrhea.  Was initially worse with meals but now is constant.  Review of Systems  A complete 10 system review of systems was obtained and all systems are negative except as noted in the HPI and PMH.   Patient's Health History    Past Medical History:  Diagnosis Date  . Diabetes mellitus   . Fibromyalgia   . GERD (gastroesophageal reflux disease)   . Lupus ([place])   . Other and unspecified hyperlipidemia 12/16/2010  . Other chronic pain 12/16/2010  . Thyroid disease   . Unspecified essential hypertension 12/16/2010    Past Surgical History:  Procedure Laterality Date  . ABDOMINAL HYSTERECTOMY  30 yrs ago  . CATARACT EXTRACTION W/PHACO  09/20/2011   Procedure: CATARACT EXTRACTION PHACO AND INTRAOCULAR LENS PLACEMENT (IOC);  Surgeon: Tonny Branch, MD;  Location: [facility];  Service: Ophthalmology;  Laterality: Left;  CDE:9:14  . CATARACT EXTRACTION W/PHACO  10/04/2011   Procedure: CATARACT EXTRACTION PHACO AND INTRAOCULAR LENS PLACEMENT (IOC);  Surgeon: Tonny Branch, MD;  Location: [facility];  Service: Ophthalmology;  Laterality: Right;  CDE:11.66  . CHOLECYSTECTOMY    . SPINAL FIXATION SURGERY      Family History  Problem Relation Age of Onset  .  Anesthesia problems Neg Hx   . Hypotension Neg Hx   . Malignant hyperthermia Neg Hx   . Pseudochol deficiency Neg Hx     Social History   Socioeconomic History  . Marital status: Single    Spouse name: Not on file  . Number of children: Not on file  . Years of education: Not on file  . Highest education level: Not on file  Occupational History  . Not on file  Social Needs  . Financial resource strain: Not on file  . Food insecurity:    Worry: Not on file    Inability: Not on file  . Transportation needs:    Medical: Not on file    Non-medical: Not on file  Tobacco Use  . Smoking status: Former Smoker    Packs/day: 0.25    Years: 1.00    Pack years: 0.25    Types: Cigarettes    Last attempt to quit: 02/03/1971    Years since quitting: 47.2  . Smokeless tobacco: Former Systems developer    Quit date: 09/14/1964  Substance and Sexual Activity  . Alcohol use: No  . Drug use: No  . Sexual activity: Not on file  Lifestyle  . Physical activity:    Days per week: Not on file    Minutes per session: Not on file  . Stress: Not on file  Relationships  . Social connections:    Talks on phone: Not on file    Gets  together: Not on file    Attends religious service: Not on file    Active member of club or organization: Not on file    Attends meetings of clubs or organizations: Not on file    Relationship status: Not on file  . Intimate partner violence:    Fear of current or ex partner: Not on file    Emotionally abused: Not on file    Physically abused: Not on file    Forced sexual activity: Not on file  Other Topics Concern  . Not on file  Social History Narrative  . Not on file     Physical Exam  Vital Signs and Nursing Notes reviewed Vitals:   04/24/18 1403 04/24/18 1430  BP: 131/65 130/61  Pulse: 63 67  Resp:    Temp:    SpO2: 96% 91%    CONSTITUTIONAL: Well-appearing, appears uncomfortable NEURO:  Alert and oriented x 3, no focal deficits EYES:  eyes equal and  reactive ENT/NECK:  no LAD, no JVD CARDIO: Regular rate, well-perfused, normal S1 and S2 PULM:  CTAB no wheezing or rhonchi GI/GU:  normal bowel sounds, non-distended, mild diffuse tenderness worse at the epigastrium, no rebound or guarding MSK/SPINE:  No gross deformities, no edema SKIN:  no rash, atraumatic PSYCH:  Appropriate speech and behavior  Diagnostic and Interventional Summary    EKG Interpretation  Date/Time:    Ventricular Rate:    PR Interval:    QRS Duration:   QT Interval:    QTC Calculation:   R Axis:     Text Interpretation:        Labs Reviewed  COMPREHENSIVE METABOLIC PANEL - Abnormal; Notable for the following components:      Result Value   Glucose, Bld 108 (*)    Creatinine, Ser 1.10 (*)    GFR calc non Af Amer 49 (*)    GFR calc Af Amer 56 (*)    All other components within normal limits  CBC - Abnormal; Notable for the following components:   WBC 3.9 (*)    RBC 3.79 (*)    Hemoglobin 11.2 (*)    HCT 35.9 (*)    All other components within normal limits  URINALYSIS, ROUTINE W REFLEX MICROSCOPIC - Abnormal; Notable for the following components:   Color, Urine STRAW (*)    Specific Gravity, Urine 1.004 (*)    Leukocytes, UA MODERATE (*)    Bacteria, UA RARE (*)    All other components within normal limits  LIPASE, BLOOD    CT ABDOMEN PELVIS W CONTRAST    (Results Pending)    Medications  HYDROmorphone (DILAUDID) injection 0.5 mg (0.5 mg Intravenous Given 04/24/18 1400)  ondansetron (ZOFRAN) injection 4 mg (4 mg Intravenous Given 04/24/18 1400)  sodium chloride 0.9 % bolus 500 mL (0 mLs Intravenous Stopped 04/24/18 1457)  diphenhydrAMINE (BENADRYL) injection 25 mg (25 mg Intravenous Given 04/24/18 1410)     Procedures Critical Care  ED Course and Medical Decision Making  I have reviewed the triage vital signs and the nursing notes.  Pertinent labs & imaging results that were available during my care of the patient were reviewed by me and  considered in my medical decision making (see below for details). Clinical Course as of Apr 24 1557  Mon Apr 24, 5436  281 72 year old female with history of diabetes, lupus, GERD here with epigastric pain that is become more generalized, acutely worsening this morning.  Associated with significant nausea.  Cannot exclude  gastric or peptic ulcer now with early perforation, will CT.  Currently with no peritonitis exam findings.  Work-up pending   [MB]    Clinical Course User Index [MB] Maudie Flakes, MD    Work-up thus far unremarkable, CT still pending.  Signed out to Dr. Lacinda Axon at shift change.  Feeling better, anticipating discharge if non-acute CT and able to tolerate PO.  Barth Kirks. [place]onia Small, [place] [email or web address]  Final Clinical Impressions(s) / ED Diagnoses     ICD-10-CM   1. Abdominal pain, unspecified abdominal location R10.9   2. Nausea R11.0     ED Discharge Orders    None         Maudie Flakes, MD 04/24/18 [phone]

## 2018-04-24 NOTE — ED Notes (Signed)
Hives noted on pts left arm. EDP notified. Orders received.

## 2018-04-24 NOTE — Discharge Instructions (Addendum)
You may have a minor urinary tract infection.  Increase fluids.  Prescription for antibiotic, pain medicine, nausea medicine.  Follow-up with your primary care doctor.

## 2018-04-24 NOTE — ED Triage Notes (Signed)
Patient complaining of abdominal pain and nausea starting this morning. Denies vomiting or diarrhea.

## 2018-04-24 NOTE — ED Provider Notes (Signed)
CT scan reviewed with patient.  Evidence of urinary tract infection noted via urinalysis.  No acute abdomen at discharge.  Discharge medications Keflex 500 mg, Naprosyn 500 mg, Zofran 4 mg.  Patient has primary care follow-up.   Nat Christen, MD 04/24/18 1800

## 2018-04-26 ENCOUNTER — Ambulatory Visit (HOSPITAL_COMMUNITY)
Admission: RE | Admit: 2018-04-26 | Discharge: 2018-04-26 | Disposition: A | Discharge status: Home / Self Care | Payer: Medicare Other | Source: Ambulatory Visit | Attending: Internal Medicine | Admitting: Internal Medicine

## 2018-04-26 ENCOUNTER — Encounter (HOSPITAL_COMMUNITY): Payer: Self-pay

## 2018-04-26 DIAGNOSIS — Z1231 Encounter for screening mammogram for malignant neoplasm of breast: Secondary | ICD-10-CM | POA: Insufficient documentation

## 2018-04-26 LAB — URINE CULTURE

## 2018-05-18 DIAGNOSIS — Z1389 Encounter for screening for other disorder: Secondary | ICD-10-CM | POA: Diagnosis not present

## 2018-05-18 DIAGNOSIS — R946 Abnormal results of thyroid function studies: Secondary | ICD-10-CM | POA: Diagnosis not present

## 2018-05-18 DIAGNOSIS — Z0001 Encounter for general adult medical examination with abnormal findings: Secondary | ICD-10-CM | POA: Diagnosis not present

## 2018-05-18 DIAGNOSIS — Z6826 Body mass index (BMI) 26.0-26.9, adult: Secondary | ICD-10-CM | POA: Diagnosis not present

## 2018-05-18 DIAGNOSIS — E663 Overweight: Secondary | ICD-10-CM | POA: Diagnosis not present

## 2018-06-08 ENCOUNTER — Ambulatory Visit (INDEPENDENT_AMBULATORY_CARE_PROVIDER_SITE_OTHER): Payer: Medicare Other | Admitting: Internal Medicine

## 2018-06-08 ENCOUNTER — Encounter (INDEPENDENT_AMBULATORY_CARE_PROVIDER_SITE_OTHER): Payer: Self-pay | Admitting: Internal Medicine

## 2018-06-08 VITALS — BP 140/70 | HR 64 | Temp 98.0°F | Ht 59.0 in | Wt 172.6 lb

## 2018-06-08 DIAGNOSIS — Z1211 Encounter for screening for malignant neoplasm of colon: Secondary | ICD-10-CM | POA: Diagnosis not present

## 2018-06-08 DIAGNOSIS — K5909 Other constipation: Secondary | ICD-10-CM | POA: Diagnosis not present

## 2018-06-08 DIAGNOSIS — K602 Anal fissure, unspecified: Secondary | ICD-10-CM

## 2018-06-08 NOTE — Patient Instructions (Signed)
The risks of bleeding, perforation and infection were reviewed with patient.  

## 2018-06-08 NOTE — Progress Notes (Signed)
Subjective:    Patient ID: [name], female    DOB: [birth year], 72 y.o.   MRN: [id number]  HPI Lasat seen in May of 2017 for rectal bleeding. Colonoscopy was cancelled. Patient was sick day of procedure. Today, she says she has frequent constipation.  She also says she had a tear in her rectum. Started on Proctofoam and she says her symptoms resolved.  She says she feels better. She is having a BM every 3 days. She takes Colace for her constipation. She says she has good results with the Colace.  Her last colonoscopy was greater than 10 yrs ago.  She says today she wants to proceed with a colonoscopy.  Her appetite is okay. No weight loss.  No abdominal pain.  Long hx of constipation.   Takes Dilaudid 8 mg (2-4 tabs a day) (Hx of Fibromyalgia)    03/03/2004: Diagnostic colonoscopy. Dr. Gala Romney  INDICATIONS FOR PROCEDURE: The patient is a 72 year old lady with multiple medical problems admitted to the hospital with right-sided abdominal pain. She has had intermittent small volume hematochezia and has anemia. Colonoscopy is now being done. This approach has been discussed with the patient previously and, again, today at the bedside. The potential risks, benefits, and alternatives have been reviewed; questions answered. Please see the documentation in the medical record.  IMPRESSION: 1. Anal canal hemorrhoids; otherwise normal rectum. 2. Normal colon.  DISCUSSION: I suspected that the patient has experienced low-volume hematochezia from hemorrhoids.     Review of Systems   Past Medical History:  Diagnosis Date  . Diabetes mellitus   . Fibromyalgia   . GERD (gastroesophageal reflux disease)   . Lupus ([place])   . Other and unspecified hyperlipidemia 12/16/2010  . Other chronic pain 12/16/2010  . Thyroid disease   . Unspecified essential hypertension 12/16/2010    Past Surgical History:  Procedure Laterality Date  . ABDOMINAL HYSTERECTOMY  30 yrs  ago  . CATARACT EXTRACTION W/PHACO  09/20/2011   Procedure: CATARACT EXTRACTION PHACO AND INTRAOCULAR LENS PLACEMENT (IOC);  Surgeon: Tonny Branch, MD;  Location: [facility];  Service: Ophthalmology;  Laterality: Left;  CDE:9:14  . CATARACT EXTRACTION W/PHACO  10/04/2011   Procedure: CATARACT EXTRACTION PHACO AND INTRAOCULAR LENS PLACEMENT (IOC);  Surgeon: Tonny Branch, MD;  Location: [facility];  Service: Ophthalmology;  Laterality: Right;  CDE:11.66  . CHOLECYSTECTOMY    . SPINAL FIXATION SURGERY      Allergies  Allergen Reactions  . Sulfa Antibiotics Anaphylaxis and Swelling  . Cymbalta [Duloxetine Hcl] Other (See Comments)    "felt funny"    Current Outpatient Medications on File Prior to Visit  Medication Sig Dispense Refill  . bisoprolol (ZEBETA) 5 MG tablet Take 5 mg by mouth daily. Reported on 11/30/2015    . clotrimazole (LOTRIMIN) 1 % cream Apply 1 application topically 2 (two) times daily.    Marland Kitchen HYDROmorphone (DILAUDID) 8 MG tablet Take 1-2 tablets (8-16 mg total) by mouth every 4 (four) hours as needed. pain 12 tablet 0  . levothyroxine (SYNTHROID, LEVOTHROID) 75 MCG tablet Take 75 mcg by mouth daily.     . pregabalin (LYRICA) 100 MG capsule Take 100 mg by mouth 3 (three) times daily.    . simvastatin (ZOCOR) 40 MG tablet Take 40 mg by mouth every evening.       No current facility-administered medications on file prior to visit.         Objective:   Physical Exam Alert and oriented. Skin warm and  dry. Oral mucosa is moist.   . Sclera anicteric, conjunctivae is pink. Thyroid not enlarged. No cervical lymphadenopathy. Lungs clear. Heart regular rate and rhythm.  Abdomen is soft. Bowel sounds are positive. No hepatomegaly. No abdominal masses felt. No tenderness.  No edema to lower extremities.           Assessment & Plan:  Constipation: She will continue the Colace. Anal fissure per records: resolved with Proctofoam. Last colonoscopy in 2005> Screening colonoscopy. The risks of  bleeding, perforation and infection were reviewed with patient.

## 2018-06-09 ENCOUNTER — Other Ambulatory Visit (INDEPENDENT_AMBULATORY_CARE_PROVIDER_SITE_OTHER): Payer: Self-pay | Admitting: Internal Medicine

## 2018-06-09 DIAGNOSIS — Z1211 Encounter for screening for malignant neoplasm of colon: Secondary | ICD-10-CM

## 2018-06-12 DIAGNOSIS — Z1211 Encounter for screening for malignant neoplasm of colon: Secondary | ICD-10-CM | POA: Insufficient documentation

## 2018-06-13 ENCOUNTER — Telehealth (INDEPENDENT_AMBULATORY_CARE_PROVIDER_SITE_OTHER): Payer: Self-pay | Admitting: *Deleted

## 2018-06-13 ENCOUNTER — Encounter (INDEPENDENT_AMBULATORY_CARE_PROVIDER_SITE_OTHER): Payer: Self-pay | Admitting: *Deleted

## 2018-06-13 MED ORDER — PEG 3350-KCL-NA BICARB-NACL 420 G PO SOLR: 4000.0000 mL | Freq: Once | ORAL | 0 refills | Status: AC

## 2018-06-13 NOTE — Telephone Encounter (Signed)
Patient needs trilyte 

## 2018-07-10 DIAGNOSIS — M1991 Primary osteoarthritis, unspecified site: Secondary | ICD-10-CM | POA: Diagnosis not present

## 2018-07-10 DIAGNOSIS — Z6835 Body mass index (BMI) 35.0-35.9, adult: Secondary | ICD-10-CM | POA: Diagnosis not present

## 2018-07-10 DIAGNOSIS — Z1389 Encounter for screening for other disorder: Secondary | ICD-10-CM | POA: Diagnosis not present

## 2018-07-10 DIAGNOSIS — E6609 Other obesity due to excess calories: Secondary | ICD-10-CM | POA: Diagnosis not present

## 2018-07-10 DIAGNOSIS — T24122A Burn of first degree of left knee, initial encounter: Secondary | ICD-10-CM | POA: Diagnosis not present

## 2018-07-10 DIAGNOSIS — M25562 Pain in left knee: Secondary | ICD-10-CM | POA: Diagnosis not present

## 2018-07-12 NOTE — Patient Instructions (Signed)
[name]  07/12/2018     @PREFPERIOPPHARMACY @   Your procedure is scheduled on  07/21/2018 .  Report to [facility] at  925   A.M.  Call this number if you have problems the morning of surgery:  [phone]   Remember:  Follow the diet and prep instructions given to you by Dr Olevia Perches office.                      Take these medicines the morning of surgery with A SIP OF WATER  Bisoprolol, dilaudid, levothyroxine, lyrica.    Do not wear jewelry, make-up or nail polish.  Do not wear lotions, powders, or perfumes, or deodorant.  Do not shave 48 hours prior to surgery.  Men may shave face and neck.  Do not bring valuables to the hospital.  [facility] is not responsible for any belongings or valuables.  Contacts, dentures or bridgework may not be worn into surgery.  Leave your suitcase in the car.  After surgery it may be brought to your room.  For patients admitted to the hospital, discharge time will be determined by your treatment team.  Patients discharged the day of surgery will not be allowed to drive home.   Name and phone number of your driver:   family Special instructions:  None  Please read over the following fact sheets that you were given. Anesthesia Post-op Instructions and Care and Recovery After Surgery       Colonoscopy, Adult A colonoscopy is an exam to look at the large intestine. It is done to check for problems, such as:  Lumps (tumors).  Growths (polyps).  Swelling (inflammation).  Bleeding.  What happens before the procedure? Eating and drinking Follow instructions from your doctor about eating and drinking. These instructions may include:  A few days before the procedure - follow a low-fiber diet. ? Avoid nuts. ? Avoid seeds. ? Avoid dried fruit. ? Avoid raw fruits. ? Avoid vegetables.  1-3 days before the procedure - follow a clear liquid diet. Avoid liquids that have red or purple dye. Drink only clear liquids,  such as: ? Clear broth or bouillon. ? Black coffee or tea. ? Clear juice. ? Clear soft drinks or sports drinks. ? Gelatin dessert. ? Popsicles.  On the day of the procedure - do not eat or drink anything during the 2 hours before the procedure.  Bowel prep If you were prescribed an oral bowel prep:  Take it as told by your doctor. Starting the day before your procedure, you will need to drink a lot of liquid. The liquid will cause you to poop (have bowel movements) until your poop is almost clear or light green.  If your skin or butt gets irritated from diarrhea, you may: ? Wipe the area with wipes that have medicine in them, such as adult wet wipes with aloe and vitamin E. ? Put something on your skin that soothes the area, such as petroleum jelly.  If you throw up (vomit) while drinking the bowel prep, take a break for up to 60 minutes. Then begin the bowel prep again. If you keep throwing up and you cannot take the bowel prep without throwing up, call your doctor.  General instructions  Ask your doctor about changing or stopping your normal medicines. This is important if you take diabetes medicines or blood thinners.  Plan to have someone take you home  from the hospital or clinic. What happens during the procedure?  An IV tube may be put into one of your veins.  You will be given medicine to help you relax (sedative).  To reduce your risk of infection: ? Your doctors will wash their hands. ? Your anal area will be washed with soap.  You will be asked to lie on your side with your knees bent.  Your doctor will get a long, thin, flexible tube ready. The tube will have a camera and a light on the end.  The tube will be put into your anus.  The tube will be gently put into your large intestine.  Air will be delivered into your large intestine to keep it open. You may feel some pressure or cramping.  The camera will be used to take photos.  A small tissue sample may be  removed from your body to be looked at under a microscope (biopsy). If any possible problems are found, the tissue will be sent to a lab for testing.  If small growths are found, your doctor may remove them and have them checked for cancer.  The tube that was put into your anus will be slowly removed. The procedure may vary among doctors and hospitals. What happens after the procedure?  Your doctor will check on you often until the medicines you were given have worn off.  Do not drive for 24 hours after the procedure.  You may have a small amount of blood in your poop.  You may pass gas.  You may have mild cramps or bloating in your belly (abdomen).  It is up to you to get the results of your procedure. Ask your doctor, or the department performing the procedure, when your results will be ready. This information is not intended to replace advice given to you by your health care provider. Make sure you discuss any questions you have with your health care provider. Document Released: 09/25/2010 Document Revised: 06/23/2016 Document Reviewed: 11/04/2015 Elsevier Interactive Patient Education  2017 [employer]  Colonoscopy, Adult, Care After This sheet gives you information about how to care for yourself after your procedure. Your health care provider may also give you more specific instructions. If you have problems or questions, contact your health care provider. What can I expect after the procedure? After the procedure, it is common to have:  A small amount of blood in your stool for 24 hours after the procedure.  Some gas.  Mild abdominal cramping or bloating.  Follow these instructions at home: General instructions   For the first 24 hours after the procedure: ? Do not drive or use machinery. ? Do not sign important documents. ? Do not drink alcohol. ? Do your regular daily activities at a slower pace than normal. ? Eat soft, easy-to-digest foods. ? Rest  often.  Take over-the-counter or prescription medicines only as told by your health care provider.  It is up to you to get the results of your procedure. Ask your health care provider, or the department performing the procedure, when your results will be ready. Relieving cramping and bloating  Try walking around when you have cramps or feel bloated.  Apply heat to your abdomen as told by your health care provider. Use a heat source that your health care provider recommends, such as a moist heat pack or a heating pad. ? Place a towel between your skin and the heat source. ? Leave the heat on for 20-30 minutes. ?  Remove the heat if your skin turns bright red. This is especially important if you are unable to feel pain, heat, or cold. You may have a greater risk of getting burned. Eating and drinking  Drink enough fluid to keep your urine clear or pale yellow.  Resume your normal diet as instructed by your health care provider. Avoid heavy or fried foods that are hard to digest.  Avoid drinking alcohol for as long as instructed by your health care provider. Contact a health care provider if:  You have blood in your stool 2-3 days after the procedure. Get help right away if:  You have more than a small spotting of blood in your stool.  You pass large blood clots in your stool.  Your abdomen is swollen.  You have nausea or vomiting.  You have a fever.  You have increasing abdominal pain that is not relieved with medicine. This information is not intended to replace advice given to you by your health care provider. Make sure you discuss any questions you have with your health care provider. Document Released: 04/06/2004 Document Revised: 05/17/2016 Document Reviewed: 11/04/2015 Elsevier Interactive Patient Education  2018 [place] Anesthesia is a term that refers to techniques, procedures, and medicines that help a person stay safe and comfortable  during a medical procedure. Monitored anesthesia care, or sedation, is one type of anesthesia. Your anesthesia specialist may recommend sedation if you will be having a procedure that does not require you to be unconscious, such as:  Cataract surgery.  A dental procedure.  A biopsy.  A colonoscopy.  During the procedure, you may receive a medicine to help you relax (sedative). There are three levels of sedation:  Mild sedation. At this level, you may feel awake and relaxed. You will be able to follow directions.  Moderate sedation. At this level, you will be sleepy. You may not remember the procedure.  Deep sedation. At this level, you will be asleep. You will not remember the procedure.  The more medicine you are given, the deeper your level of sedation will be. Depending on how you respond to the procedure, the anesthesia specialist may change your level of sedation or the type of anesthesia to fit your needs. An anesthesia specialist will monitor you closely during the procedure. Let your health care provider know about:  Any allergies you have.  All medicines you are taking, including vitamins, herbs, eye drops, creams, and over-the-counter medicines.  Any use of steroids (by mouth or as a cream).  Any problems you or family members have had with sedatives and anesthetic medicines.  Any blood disorders you have.  Any surgeries you have had.  Any medical conditions you have, such as sleep apnea.  Whether you are pregnant or may be pregnant.  Any use of cigarettes, alcohol, or street drugs. What are the risks? Generally, this is a safe procedure. However, problems may occur, including:  Getting too much medicine (oversedation).  Nausea.  Allergic reaction to medicines.  Trouble breathing. If this happens, a breathing tube may be used to help with breathing. It will be removed when you are awake and breathing on your own.  Heart trouble.  Lung trouble.  Before  the procedure Staying hydrated Follow instructions from your health care provider about hydration, which may include:  Up to 2 hours before the procedure - you may continue to drink clear liquids, such as water, clear fruit juice, black coffee, and plain tea.  Eating and drinking restrictions Follow instructions from your health care provider about eating and drinking, which may include:  8 hours before the procedure - stop eating heavy meals or foods such as meat, fried foods, or fatty foods.  6 hours before the procedure - stop eating light meals or foods, such as toast or cereal.  6 hours before the procedure - stop drinking milk or drinks that contain milk.  2 hours before the procedure - stop drinking clear liquids.  Medicines Ask your health care provider about:  Changing or stopping your regular medicines. This is especially important if you are taking diabetes medicines or blood thinners.  Taking medicines such as aspirin and ibuprofen. These medicines can thin your blood. Do not take these medicines before your procedure if your health care provider instructs you not to.  Tests and exams  You will have a physical exam.  You may have blood tests done to show: ? How well your kidneys and liver are working. ? How well your blood can clot.  General instructions  Plan to have someone take you home from the hospital or clinic.  If you will be going home right after the procedure, plan to have someone with you for 24 hours.  What happens during the procedure?  Your blood pressure, heart rate, breathing, level of pain and overall condition will be monitored.  An IV tube will be inserted into one of your veins.  Your anesthesia specialist will give you medicines as needed to keep you comfortable during the procedure. This may mean changing the level of sedation.  The procedure will be performed. After the procedure  Your blood pressure, heart rate, breathing rate, and  blood oxygen level will be monitored until the medicines you were given have worn off.  Do not drive for 24 hours if you received a sedative.  You may: ? Feel sleepy, clumsy, or nauseous. ? Feel forgetful about what happened after the procedure. ? Have a sore throat if you had a breathing tube during the procedure. ? Vomit. This information is not intended to replace advice given to you by your health care provider. Make sure you discuss any questions you have with your health care provider. Document Released: 05/19/2005 Document Revised: 01/30/2016 Document Reviewed: 12/14/2015 Elsevier Interactive Patient Education  2018 [place], Care After These instructions provide you with information about caring for yourself after your procedure. Your health care provider may also give you more specific instructions. Your treatment has been planned according to current medical practices, but problems sometimes occur. Call your health care provider if you have any problems or questions after your procedure. What can I expect after the procedure? After your procedure, it is common to:  Feel sleepy for several hours.  Feel clumsy and have poor balance for several hours.  Feel forgetful about what happened after the procedure.  Have poor judgment for several hours.  Feel nauseous or vomit.  Have a sore throat if you had a breathing tube during the procedure.  Follow these instructions at home: For at least 24 hours after the procedure:   Do not: ? Participate in activities in which you could fall or become injured. ? Drive. ? Use heavy machinery. ? Drink alcohol. ? Take sleeping pills or medicines that cause drowsiness. ? Make important decisions or sign legal documents. ? Take care of children on your own.  Rest. Eating and drinking  Follow the diet that is recommended by your  health care provider.  If you vomit, drink water, juice, or soup when you  can drink without vomiting.  Make sure you have little or no nausea before eating solid foods. General instructions  Have a responsible adult stay with you until you are awake and alert.  Take over-the-counter and prescription medicines only as told by your health care provider.  If you smoke, do not smoke without supervision.  Keep all follow-up visits as told by your health care provider. This is important. Contact a health care provider if:  You keep feeling nauseous or you keep vomiting.  You feel light-headed.  You develop a rash.  You have a fever. Get help right away if:  You have trouble breathing. This information is not intended to replace advice given to you by your health care provider. Make sure you discuss any questions you have with your health care provider. Document Released: 12/14/2015 Document Revised: 04/14/2016 Document Reviewed: 12/14/2015 Elsevier Interactive Patient Education  [employer].

## 2018-07-18 ENCOUNTER — Other Ambulatory Visit: Payer: Self-pay

## 2018-07-18 ENCOUNTER — Encounter (HOSPITAL_COMMUNITY): Payer: Self-pay

## 2018-07-18 ENCOUNTER — Encounter (HOSPITAL_COMMUNITY)
Admission: RE | Admit: 2018-07-18 | Discharge: 2018-07-18 | Disposition: A | Discharge status: Home / Self Care | Payer: Medicare Other | Source: Ambulatory Visit | Attending: Internal Medicine | Admitting: Internal Medicine

## 2018-07-18 DIAGNOSIS — Z01810 Encounter for preprocedural cardiovascular examination: Secondary | ICD-10-CM | POA: Insufficient documentation

## 2018-07-18 DIAGNOSIS — E119 Type 2 diabetes mellitus without complications: Secondary | ICD-10-CM | POA: Diagnosis not present

## 2018-07-18 DIAGNOSIS — M329 Systemic lupus erythematosus, unspecified: Secondary | ICD-10-CM | POA: Diagnosis not present

## 2018-07-18 DIAGNOSIS — K5909 Other constipation: Secondary | ICD-10-CM | POA: Diagnosis not present

## 2018-07-18 DIAGNOSIS — G8929 Other chronic pain: Secondary | ICD-10-CM | POA: Diagnosis not present

## 2018-07-18 DIAGNOSIS — K5730 Diverticulosis of large intestine without perforation or abscess without bleeding: Secondary | ICD-10-CM | POA: Diagnosis not present

## 2018-07-18 DIAGNOSIS — Z1211 Encounter for screening for malignant neoplasm of colon: Secondary | ICD-10-CM

## 2018-07-18 DIAGNOSIS — M797 Fibromyalgia: Secondary | ICD-10-CM | POA: Diagnosis not present

## 2018-07-18 DIAGNOSIS — E785 Hyperlipidemia, unspecified: Secondary | ICD-10-CM | POA: Diagnosis not present

## 2018-07-18 DIAGNOSIS — Z882 Allergy status to sulfonamides status: Secondary | ICD-10-CM | POA: Diagnosis not present

## 2018-07-18 DIAGNOSIS — K219 Gastro-esophageal reflux disease without esophagitis: Secondary | ICD-10-CM | POA: Diagnosis not present

## 2018-07-18 DIAGNOSIS — I10 Essential (primary) hypertension: Secondary | ICD-10-CM | POA: Diagnosis not present

## 2018-07-18 DIAGNOSIS — Z87891 Personal history of nicotine dependence: Secondary | ICD-10-CM | POA: Diagnosis not present

## 2018-07-18 DIAGNOSIS — E079 Disorder of thyroid, unspecified: Secondary | ICD-10-CM | POA: Diagnosis not present

## 2018-07-21 ENCOUNTER — Ambulatory Visit (HOSPITAL_COMMUNITY): Payer: Medicare Other | Admitting: Anesthesiology

## 2018-07-21 ENCOUNTER — Encounter (HOSPITAL_COMMUNITY): Payer: Self-pay

## 2018-07-21 ENCOUNTER — Ambulatory Visit (HOSPITAL_COMMUNITY)
Admission: RE | Admit: 2018-07-21 | Discharge: 2018-07-21 | Disposition: A | Discharge status: Home / Self Care | Payer: Medicare Other | Source: Ambulatory Visit | Attending: Internal Medicine | Admitting: Internal Medicine

## 2018-07-21 ENCOUNTER — Encounter (HOSPITAL_COMMUNITY)
Admission: RE | Disposition: A | Discharge status: Home / Self Care | Payer: Self-pay | Source: Ambulatory Visit | Attending: Internal Medicine

## 2018-07-21 DIAGNOSIS — K5909 Other constipation: Secondary | ICD-10-CM | POA: Insufficient documentation

## 2018-07-21 DIAGNOSIS — K219 Gastro-esophageal reflux disease without esophagitis: Secondary | ICD-10-CM | POA: Insufficient documentation

## 2018-07-21 DIAGNOSIS — M797 Fibromyalgia: Secondary | ICD-10-CM | POA: Diagnosis not present

## 2018-07-21 DIAGNOSIS — Z882 Allergy status to sulfonamides status: Secondary | ICD-10-CM | POA: Insufficient documentation

## 2018-07-21 DIAGNOSIS — E119 Type 2 diabetes mellitus without complications: Secondary | ICD-10-CM | POA: Diagnosis not present

## 2018-07-21 DIAGNOSIS — Z1211 Encounter for screening for malignant neoplasm of colon: Secondary | ICD-10-CM | POA: Diagnosis not present

## 2018-07-21 DIAGNOSIS — Z87891 Personal history of nicotine dependence: Secondary | ICD-10-CM | POA: Diagnosis not present

## 2018-07-21 DIAGNOSIS — E785 Hyperlipidemia, unspecified: Secondary | ICD-10-CM | POA: Insufficient documentation

## 2018-07-21 DIAGNOSIS — M329 Systemic lupus erythematosus, unspecified: Secondary | ICD-10-CM | POA: Diagnosis not present

## 2018-07-21 DIAGNOSIS — G8929 Other chronic pain: Secondary | ICD-10-CM | POA: Diagnosis not present

## 2018-07-21 DIAGNOSIS — I10 Essential (primary) hypertension: Secondary | ICD-10-CM | POA: Insufficient documentation

## 2018-07-21 DIAGNOSIS — E079 Disorder of thyroid, unspecified: Secondary | ICD-10-CM | POA: Diagnosis not present

## 2018-07-21 DIAGNOSIS — K5730 Diverticulosis of large intestine without perforation or abscess without bleeding: Secondary | ICD-10-CM | POA: Insufficient documentation

## 2018-07-21 HISTORY — PX: COLONOSCOPY WITH PROPOFOL: SHX5780

## 2018-07-21 LAB — GLUCOSE, CAPILLARY: GLUCOSE-CAPILLARY: 95 mg/dL (ref 70–99)

## 2018-07-21 SURGERY — COLONOSCOPY WITH PROPOFOL
Anesthesia: Monitor Anesthesia Care

## 2018-07-21 MED ORDER — STERILE WATER FOR IRRIGATION IR SOLN
Status: DC | PRN
  Administered 2018-07-21: 1.5 mL

## 2018-07-21 MED ORDER — HYDROCODONE-ACETAMINOPHEN 7.5-325 MG PO TABS: 1.0000 | ORAL_TABLET | Freq: Once | ORAL | Status: DC | PRN

## 2018-07-21 MED ORDER — MEPERIDINE HCL 100 MG/ML IJ SOLN: 6.2500 mg | INTRAMUSCULAR | Status: DC | PRN

## 2018-07-21 MED ORDER — CHLORHEXIDINE GLUCONATE CLOTH 2 % EX PADS: 6.0000 | MEDICATED_PAD | Freq: Once | CUTANEOUS | Status: DC

## 2018-07-21 MED ORDER — PROPOFOL 500 MG/50ML IV EMUL
INTRAVENOUS | Status: DC | PRN
  Administered 2018-07-21: 150 ug/kg/min via INTRAVENOUS

## 2018-07-21 MED ORDER — LACTATED RINGERS IV SOLN
INTRAVENOUS | Status: DC
  Administered 2018-07-21: 11:00:00 via INTRAVENOUS

## 2018-07-21 MED ORDER — LACTATED RINGERS IV SOLN: INTRAVENOUS | Status: DC

## 2018-07-21 MED ORDER — HYDROMORPHONE HCL 1 MG/ML IJ SOLN: 0.2500 mg | INTRAMUSCULAR | Status: DC | PRN

## 2018-07-21 MED ORDER — PROMETHAZINE HCL 25 MG/ML IJ SOLN: 6.2500 mg | INTRAMUSCULAR | Status: DC | PRN

## 2018-07-21 MED ORDER — PROPOFOL 10 MG/ML IV BOLUS
INTRAVENOUS | Status: DC | PRN
  Administered 2018-07-21: 40 mg via INTRAVENOUS
  Administered 2018-07-21: 20 mg via INTRAVENOUS

## 2018-07-21 NOTE — Discharge Instructions (Signed)
Resume usual medications as before. High-fiber diet. No driving for 24 hours.     Colonoscopy, Adult, Care After This sheet gives you information about how to care for yourself after your procedure. Your doctor may also give you more specific instructions. If you have problems or questions, call your doctor. Follow these instructions at home: General instructions   For the first 24 hours after the procedure: ? Do not drive or use machinery. ? Do not sign important documents. ? Do not drink alcohol. ? Do your daily activities more slowly than normal. ? Eat foods that are soft and easy to digest. ? Rest often.  Take over-the-counter or prescription medicines only as told by your doctor.  It is up to you to get the results of your procedure. Ask your doctor, or the department performing the procedure, when your results will be ready. To help cramping and bloating:  Try walking around.  Put heat on your belly (abdomen) as told by your doctor. Use a heat source that your doctor recommends, such as a moist heat pack or a heating pad. ? Put a towel between your skin and the heat source. ? Leave the heat on for 20-30 minutes. ? Remove the heat if your skin turns bright red. This is especially important if you cannot feel pain, heat, or cold. You can get burned. Eating and drinking  Drink enough fluid to keep your pee (urine) clear or pale yellow.  Return to your normal diet as told by your doctor. Avoid heavy or fried foods that are hard to digest.  Avoid drinking alcohol for as long as told by your doctor. Contact a doctor if:  You have blood in your poop (stool) 2-3 days after the procedure. Get help right away if:  You have more than a small amount of blood in your poop.  You see large clumps of tissue (blood clots) in your poop.  Your belly is swollen.  You feel sick to your stomach (nauseous).  You throw up (vomit).  You have a fever.  You have belly pain that  gets worse, and medicine does not help your pain. This information is not intended to replace advice given to you by your health care provider. Make sure you discuss any questions you have with your health care provider. Document Released: 09/25/2010 Document Revised: 05/17/2016 Document Reviewed: 05/17/2016 Elsevier Interactive Patient Education  2017 [place], Care After These instructions provide you with information about caring for yourself after your procedure. Your health care provider may also give you more specific instructions. Your treatment has been planned according to current medical practices, but problems sometimes occur. Call your health care provider if you have any problems or questions after your procedure. What can I expect after the procedure? After your procedure, it is common to:  Feel sleepy for several hours.  Feel clumsy and have poor balance for several hours.  Feel forgetful about what happened after the procedure.  Have poor judgment for several hours.  Feel nauseous or vomit.  Have a sore throat if you had a breathing tube during the procedure.  Follow these instructions at home: For at least 24 hours after the procedure:   Do not: ? Participate in activities in which you could fall or become injured. ? Drive. ? Use heavy machinery. ? Drink alcohol. ? Take sleeping pills or medicines that cause drowsiness. ? Make important decisions or sign legal documents. ? Take care of children  on your own.  Rest. Eating and drinking  Follow the diet that is recommended by your health care provider.  If you vomit, drink water, juice, or soup when you can drink without vomiting.  Make sure you have little or no nausea before eating solid foods. General instructions  Have a responsible adult stay with you until you are awake and alert.  Take over-the-counter and prescription medicines only as told by your health care  provider.  If you smoke, do not smoke without supervision.  Keep all follow-up visits as told by your health care provider. This is important. Contact a health care provider if:  You keep feeling nauseous or you keep vomiting.  You feel light-headed.  You develop a rash.  You have a fever. Get help right away if:  You have trouble breathing. This information is not intended to replace advice given to you by your health care provider. Make sure you discuss any questions you have with your health care provider. Document Released: 12/14/2015 Document Revised: 04/14/2016 Document Reviewed: 12/14/2015 Elsevier Interactive Patient Education  2018 [place].   High-Fiber Diet Fiber, also called dietary fiber, is a type of carbohydrate found in fruits, vegetables, whole grains, and beans. A high-fiber diet can have many health benefits. Your health care provider may recommend a high-fiber diet to help:  Prevent constipation. Fiber can make your bowel movements more regular.  Lower your cholesterol.  Relieve hemorrhoids, uncomplicated diverticulosis, or irritable bowel syndrome.  Prevent overeating as part of a weight-loss plan.  Prevent heart disease, type 2 diabetes, and certain cancers.  What is my plan? The recommended daily intake of fiber includes:  38 grams for men under age 76.  74 grams for men over age 44.  46 grams for women under age 20.  8 grams for women over age 27.  You can get the recommended daily intake of dietary fiber by eating a variety of fruits, vegetables, grains, and beans. Your health care provider may also recommend a fiber supplement if it is not possible to get enough fiber through your diet. What do I need to know about a high-fiber diet?  Fiber supplements have not been widely studied for their effectiveness, so it is better to get fiber through food sources.  Always check the fiber content on thenutrition facts label of any prepackaged  food. Look for foods that contain at least 5 grams of fiber per serving.  Ask your dietitian if you have questions about specific foods that are related to your condition, especially if those foods are not listed in the following section.  Increase your daily fiber consumption gradually. Increasing your intake of dietary fiber too quickly may cause bloating, cramping, or gas.  Drink plenty of water. Water helps you to digest fiber. What foods can I eat? Grains Whole-grain breads. Multigrain cereal. Oats and oatmeal. Brown rice. Barley. Bulgur wheat. [place]. Bran muffins. Popcorn. Rye wafer crackers. Vegetables Sweet potatoes. Spinach. Kale. Artichokes. Cabbage. Broccoli. Green peas. Carrots. Squash. Fruits Berries. Pears. Apples. Oranges. Avocados. Prunes and raisins. Dried figs. Meats and Other Protein Sources [place], kidney, pinto, and soy beans. Split peas. Lentils. Nuts and seeds. Dairy Fiber-fortified yogurt. Beverages Fiber-fortified soy milk. Fiber-fortified orange juice. Other Fiber bars. The items listed above may not be a complete list of recommended foods or beverages. Contact your dietitian for more options. What foods are not recommended? Grains White bread. Pasta made with refined flour. White rice. Vegetables Fried potatoes. Canned vegetables. Well-cooked vegetables. Fruits  Fruit juice. Cooked, strained fruit. Meats and Other Protein Sources Fatty cuts of meat. Fried Sales executive or fried fish. Dairy Milk. Yogurt. Cream cheese. Sour cream. Beverages Soft drinks. Other Cakes and pastries. Butter and oils. The items listed above may not be a complete list of foods and beverages to avoid. Contact your dietitian for more information. What are some tips for including high-fiber foods in my diet?  Eat a wide variety of high-fiber foods.  Make sure that half of all grains consumed each day are whole grains.  Replace breads and cereals made from refined flour or white  flour with whole-grain breads and cereals.  Replace white rice with brown rice, bulgur wheat, or millet.  Start the day with a breakfast that is high in fiber, such as a cereal that contains at least 5 grams of fiber per serving.  Use beans in place of meat in soups, salads, or pasta.  Eat high-fiber snacks, such as berries, raw vegetables, nuts, or popcorn. This information is not intended to replace advice given to you by your health care provider. Make sure you discuss any questions you have with your health care provider. Document Released: 08/23/2005 Document Revised: 01/29/2016 Document Reviewed: 02/05/2014 Elsevier Interactive Patient Education  [employer].

## 2018-07-21 NOTE — Op Note (Signed)
[facility] Patient Name: [name] Procedure Date: 07/21/2018 11:06 AM MRN: [id number] Date of Birth: September 27, 1945 Attending MD: [name] , MD CSN: [id number] Age: 72 Admit Type: Outpatient Procedure:                Colonoscopy Indications:              Screening for colorectal malignant neoplasm Providers:                [name], MD, Lurline Del, RN, Gerome Sam,                            RN, Nelma Rothman, Technician Referring MD:             [employer]mond [employer], MD Medicines:                Propofol per Anesthesia Complications:            No immediate complications. Estimated Blood Loss:     Estimated blood loss: none. Procedure:                Pre-Anesthesia Assessment:                           - Prior to the procedure, a History and Physical                            was performed, and patient medications and                            allergies were reviewed. The patient's tolerance of                            previous anesthesia was also reviewed. The risks                            and benefits of the procedure and the sedation                            options and risks were discussed with the patient.                            All questions were answered, and informed consent                            was obtained. Prior Anticoagulants: The patient has                            taken no previous anticoagulant or antiplatelet                            agents. ASA Grade Assessment: III - A patient with                            severe systemic disease. After reviewing the risks  and benefits, the patient was deemed in                            satisfactory condition to undergo the procedure.                           After obtaining informed consent, the colonoscope                            was passed under direct vision. Throughout the                            procedure, the patient's blood pressure, pulse, and                    oxygen saturations were monitored continuously. The                            [id number]-[id number] ([id number]) scope was introduced through                            the and advanced to the the cecum, identified by                            appendiceal orifice and ileocecal valve. The                            colonoscopy was somewhat difficult due to a                            redundant colon. Successful completion of the                            procedure was aided by changing the patient to a                            supine position, using manual pressure and                            withdrawing and reinserting the scope. The patient                            tolerated the procedure well. The quality of the                            bowel preparation was excellent. The ileocecal                            valve, appendiceal orifice, and rectum were                            photographed. Scope In: 11:14:52 AM Scope Out: 11:32:10 AM Scope Withdrawal Time: 0 hours 5 minutes 48 seconds  Total Procedure Duration: 0 hours 17 minutes 18 seconds  Findings:  The perianal and digital rectal examinations were normal.      A few diverticula were found in the sigmoid colon.      The exam was otherwise normal throughout the examined colon.      The retroflexed view of the distal rectum and anal verge was normal and       showed no anal or rectal abnormalities. Impression:               - Diverticulosis in the sigmoid colon.                           - No specimens collected. Moderate Sedation:      Per Anesthesia Care Recommendation:           - Patient has a contact number available for                            emergencies. The signs and symptoms of potential                            delayed complications were discussed with the                            patient. Return to normal activities tomorrow.                            Written discharge instructions were  provided to the                            patient.                           - High fiber diet today.                           - Continue present medications.                           - No repeat colonoscopy due to age and the absence                            of advanced adenomas. Procedure Code(s):        --- Professional ---                           [phone], Colonoscopy, flexible; diagnostic, including                            collection of specimen(s) by brushing or washing,                            when performed (separate procedure) Diagnosis Code(s):        --- Professional ---                           Z12.11, Encounter for screening for malignant  neoplasm of colon                           K57.30, Diverticulosis of large intestine without                            perforation or abscess without bleeding CPT copyright 2018 American Medical Association. All rights reserved. The codes documented in this report are preliminary and upon coder review may  be revised to meet current compliance requirements. [name], MD [name], MD 07/21/2018 11:41:17 AM This report has been signed electronically. Number of Addenda: 0

## 2018-07-21 NOTE — H&P (Signed)
[name] is an 72 y.o. female.   Chief Complaint: Patient is here for colonoscopy. HPI: Patient is 72 year old Caucasian female with multiple medical problems including lupus who is here for screening colonoscopy.  Last exam was in 2005.  She denies abdominal pain or rectal bleeding.  She states she has chronic constipation and watches her diet and is on stool softener. She does not take aspirin or other NSAIDs. Family history is negative for CRC.  Past Medical History:  Diagnosis Date  . Diabetes mellitus   . Fibromyalgia   . GERD (gastroesophageal reflux disease)   . Lupus ([place])   . Other and unspecified hyperlipidemia 12/16/2010  . Other chronic pain 12/16/2010  . Thyroid disease   . Unspecified essential hypertension 12/16/2010    Past Surgical History:  Procedure Laterality Date  . ABDOMINAL HYSTERECTOMY  30 yrs ago  . CATARACT EXTRACTION W/PHACO  09/20/2011   Procedure: CATARACT EXTRACTION PHACO AND INTRAOCULAR LENS PLACEMENT (IOC);  Surgeon: Tonny Branch, MD;  Location: [facility];  Service: Ophthalmology;  Laterality: Left;  CDE:9:14  . CATARACT EXTRACTION W/PHACO  10/04/2011   Procedure: CATARACT EXTRACTION PHACO AND INTRAOCULAR LENS PLACEMENT (IOC);  Surgeon: Tonny Branch, MD;  Location: [facility];  Service: Ophthalmology;  Laterality: Right;  CDE:11.66  . CHOLECYSTECTOMY    . SPINAL FIXATION SURGERY      Family History  Problem Relation Age of Onset  . Anesthesia problems Neg Hx   . Hypotension Neg Hx   . Malignant hyperthermia Neg Hx   . Pseudochol deficiency Neg Hx    Social History:  reports that she quit smoking about 47 years ago. Her smoking use included cigarettes. She has a 0.25 pack-year smoking history. She quit smokeless tobacco use about 53 years ago. She reports that she does not drink alcohol or use drugs.  Allergies:  Allergies  Allergen Reactions  . Sulfa Antibiotics Anaphylaxis and Swelling  . Cymbalta [Duloxetine Hcl] Other (See Comments)    "felt funny"     Medications Prior to Admission  Medication Sig Dispense Refill  . bisoprolol (ZEBETA) 5 MG tablet Take 5 mg by mouth daily.     . clotrimazole-betamethasone (LOTRISONE) cream Apply 1 application topically 2 (two) times daily as needed (for irritation).     Marland Kitchen HYDROmorphone (DILAUDID) 8 MG tablet Take 1-2 tablets (8-16 mg total) by mouth every 4 (four) hours as needed. pain (Patient taking differently: Take 8-16 mg by mouth every 4 (four) hours as needed for severe pain. ) 12 tablet 0  . levothyroxine (SYNTHROID, LEVOTHROID) 75 MCG tablet Take 75 mcg by mouth daily before breakfast.     . metoCLOPramide (REGLAN) 5 MG tablet Take 5 mg by mouth 4 (four) times daily.    . Multiple Vitamins-Minerals (WOMENS MULTIVITAMIN PO) Take 1 tablet by mouth daily.    . mupirocin ointment (BACTROBAN) 2 % Apply 1 application topically 3 (three) times daily as needed (for burn).    . pregabalin (LYRICA) 100 MG capsule Take 100 mg by mouth 3 (three) times daily.    . simvastatin (ZOCOR) 40 MG tablet Take 40 mg by mouth at bedtime.       No results found for this or any previous visit (from the past 48 hour(s)). No results found.  ROS  Blood pressure 134/65, pulse 64, temperature 98.6 F (37 C), temperature source Oral, resp. rate 12, SpO2 97 %. Physical Exam  Constitutional: She appears well-developed and well-nourished.  HENT:  Mouth/Throat: Oropharynx is clear  and moist.  Patient is edentulous.  Eyes: Conjunctivae are normal. No scleral icterus.  Neck: No thyromegaly present.  Cardiovascular: Normal rate and normal heart sounds.  No murmur heard. Respiratory: Effort normal and breath sounds normal.  GI:  Abdomen is symmetrical with lower midline scar.  On palpation it soft and nontender with organomegaly or masses.  Musculoskeletal: She exhibits no edema.  Marked swelling noted to metacarpophalangeal joints of left hand and less so in the right hand.  Neurological: She is alert.  Skin: Skin is  warm and dry.     Assessment/Plan Average risk screening colonoscopy.  [name], MD 07/21/2018, 11:02 AM

## 2018-07-21 NOTE — Anesthesia Postprocedure Evaluation (Signed)
Anesthesia Post Note  Patient: ANUSHA CLAUS  Procedure(s) Performed: COLONOSCOPY WITH PROPOFOL (N/A )  Patient location during evaluation: Short Stay Anesthesia Type: MAC Level of consciousness: awake and alert and patient cooperative Pain management: satisfactory to patient Vital Signs Assessment: post-procedure vital signs reviewed and stable Respiratory status: spontaneous breathing Cardiovascular status: stable Postop Assessment: no apparent nausea or vomiting Anesthetic complications: no     Last Vitals:  Vitals:   07/21/18 1159 07/21/18 1210  BP:  (!) 176/71  Pulse: (!) 58 72  Resp: 13 18  Temp:  37 C  SpO2: 98% 98%    Last Pain:  Vitals:   07/21/18 1210  TempSrc: Oral  PainSc: 0-No pain                 Daivion Pape

## 2018-07-21 NOTE — Transfer of Care (Signed)
Immediate Anesthesia Transfer of Care Note  Patient: [name]  Procedure(s) Performed: COLONOSCOPY WITH PROPOFOL (N/A )  Patient Location: PACU  Anesthesia Type:MAC  Level of Consciousness: awake and patient cooperative  Airway & Oxygen Therapy: Patient Spontanous Breathing  Post-op Assessment: Report given to RN and Post -op Vital signs reviewed and stable  Post vital signs: Reviewed and stable  Last Vitals:  Vitals Value Taken Time  BP 109/76 07/21/2018 11:39 AM  Temp    Pulse 63 07/21/2018 11:41 AM  Resp 13 07/21/2018 11:41 AM  SpO2 95 % 07/21/2018 11:41 AM  Vitals shown include unvalidated device data.  Last Pain:  Vitals:   07/21/18 1100  TempSrc:   PainSc: 7          Complications: No apparent anesthesia complications

## 2018-07-21 NOTE — Progress Notes (Signed)
Patient presented today for her procedure. Pressure injury noted during assessment.  Satge2 tissue damage on left and right butt cheek with bruising around both sites.  Patient aware and her PCP prescribed cream.

## 2018-07-21 NOTE — Anesthesia Procedure Notes (Signed)
Procedure Name: MAC Date/Time: 07/21/2018 11:07 AM Performed by: Vista Deck, CRNA Pre-anesthesia Checklist: Patient identified, Emergency Drugs available, Suction available, Timeout performed and Patient being monitored Patient Re-evaluated:Patient Re-evaluated prior to induction Oxygen Delivery Method: Non-rebreather mask

## 2018-07-21 NOTE — Anesthesia Preprocedure Evaluation (Signed)
Anesthesia Evaluation    Airway Mallampati: II       Dental  (+) Edentulous Lower, Edentulous Upper   Pulmonary former smoker,    breath sounds clear to auscultation       Cardiovascular hypertension,  Rhythm:regular     Neuro/Psych  Neuromuscular disease    GI/Hepatic GERD  Medicated,  Endo/Other  diabetes, Type 2Hypothyroidism   Renal/GU      Musculoskeletal   Abdominal   Peds  Hematology   Anesthesia Other Findings Small mouth opening  Reproductive/Obstetrics                             Anesthesia Physical Anesthesia Plan  ASA: III  Anesthesia Plan: MAC   Post-op Pain Management:    Induction:   PONV Risk Score and Plan:   Airway Management Planned:   Additional Equipment:   Intra-op Plan:   Post-operative Plan:   Informed Consent:   Plan Discussed with: Anesthesiologist  Anesthesia Plan Comments:         Anesthesia Quick Evaluation

## 2018-07-26 ENCOUNTER — Encounter (HOSPITAL_COMMUNITY): Payer: Self-pay | Admitting: Internal Medicine

## 2018-08-23 DIAGNOSIS — E669 Obesity, unspecified: Secondary | ICD-10-CM | POA: Diagnosis not present

## 2018-08-23 DIAGNOSIS — M1991 Primary osteoarthritis, unspecified site: Secondary | ICD-10-CM | POA: Diagnosis not present

## 2018-08-23 DIAGNOSIS — L8990 Pressure ulcer of unspecified site, unspecified stage: Secondary | ICD-10-CM | POA: Diagnosis not present

## 2018-08-23 DIAGNOSIS — I10 Essential (primary) hypertension: Secondary | ICD-10-CM | POA: Diagnosis not present

## 2018-08-23 DIAGNOSIS — Z6836 Body mass index (BMI) 36.0-36.9, adult: Secondary | ICD-10-CM | POA: Diagnosis not present

## 2018-10-09 ENCOUNTER — Encounter: Payer: Self-pay | Admitting: Orthopaedic Surgery

## 2018-11-30 DIAGNOSIS — G894 Chronic pain syndrome: Secondary | ICD-10-CM | POA: Diagnosis not present

## 2018-11-30 DIAGNOSIS — E6609 Other obesity due to excess calories: Secondary | ICD-10-CM | POA: Diagnosis not present

## 2018-11-30 DIAGNOSIS — Z6835 Body mass index (BMI) 35.0-35.9, adult: Secondary | ICD-10-CM | POA: Diagnosis not present

## 2019-02-23 DIAGNOSIS — G894 Chronic pain syndrome: Secondary | ICD-10-CM | POA: Diagnosis not present

## 2019-02-23 DIAGNOSIS — Z6833 Body mass index (BMI) 33.0-33.9, adult: Secondary | ICD-10-CM | POA: Diagnosis not present

## 2019-02-23 DIAGNOSIS — Z1389 Encounter for screening for other disorder: Secondary | ICD-10-CM | POA: Diagnosis not present

## 2019-02-23 DIAGNOSIS — E6609 Other obesity due to excess calories: Secondary | ICD-10-CM | POA: Diagnosis not present

## 2019-03-27 DIAGNOSIS — M1991 Primary osteoarthritis, unspecified site: Secondary | ICD-10-CM | POA: Diagnosis not present

## 2019-03-27 DIAGNOSIS — G894 Chronic pain syndrome: Secondary | ICD-10-CM | POA: Diagnosis not present

## 2019-03-27 DIAGNOSIS — M2550 Pain in unspecified joint: Secondary | ICD-10-CM | POA: Diagnosis not present

## 2019-03-27 DIAGNOSIS — Z6833 Body mass index (BMI) 33.0-33.9, adult: Secondary | ICD-10-CM | POA: Diagnosis not present

## 2019-04-26 DIAGNOSIS — G894 Chronic pain syndrome: Secondary | ICD-10-CM | POA: Diagnosis not present

## 2019-04-26 DIAGNOSIS — R350 Frequency of micturition: Secondary | ICD-10-CM | POA: Diagnosis not present

## 2019-04-26 DIAGNOSIS — E6609 Other obesity due to excess calories: Secondary | ICD-10-CM | POA: Diagnosis not present

## 2019-04-26 DIAGNOSIS — Z6834 Body mass index (BMI) 34.0-34.9, adult: Secondary | ICD-10-CM | POA: Diagnosis not present

## 2019-04-26 DIAGNOSIS — N342 Other urethritis: Secondary | ICD-10-CM | POA: Diagnosis not present

## 2019-05-24 DIAGNOSIS — Z0000 Encounter for general adult medical examination without abnormal findings: Secondary | ICD-10-CM | POA: Diagnosis not present

## 2019-05-24 DIAGNOSIS — K219 Gastro-esophageal reflux disease without esophagitis: Secondary | ICD-10-CM | POA: Diagnosis not present

## 2019-05-24 DIAGNOSIS — Z6833 Body mass index (BMI) 33.0-33.9, adult: Secondary | ICD-10-CM | POA: Diagnosis not present

## 2019-05-24 DIAGNOSIS — G894 Chronic pain syndrome: Secondary | ICD-10-CM | POA: Diagnosis not present

## 2019-05-24 DIAGNOSIS — M159 Polyosteoarthritis, unspecified: Secondary | ICD-10-CM | POA: Diagnosis not present

## 2019-05-24 DIAGNOSIS — I10 Essential (primary) hypertension: Secondary | ICD-10-CM | POA: Diagnosis not present

## 2019-05-24 DIAGNOSIS — R350 Frequency of micturition: Secondary | ICD-10-CM | POA: Diagnosis not present

## 2019-05-24 DIAGNOSIS — R300 Dysuria: Secondary | ICD-10-CM | POA: Diagnosis not present

## 2019-05-24 DIAGNOSIS — E039 Hypothyroidism, unspecified: Secondary | ICD-10-CM | POA: Diagnosis not present

## 2019-05-24 DIAGNOSIS — E6609 Other obesity due to excess calories: Secondary | ICD-10-CM | POA: Diagnosis not present

## 2019-05-24 DIAGNOSIS — Z1389 Encounter for screening for other disorder: Secondary | ICD-10-CM | POA: Diagnosis not present

## 2019-06-21 DIAGNOSIS — G894 Chronic pain syndrome: Secondary | ICD-10-CM | POA: Diagnosis not present

## 2019-06-21 DIAGNOSIS — M1991 Primary osteoarthritis, unspecified site: Secondary | ICD-10-CM | POA: Diagnosis not present

## 2019-06-21 DIAGNOSIS — Z13220 Encounter for screening for lipoid disorders: Secondary | ICD-10-CM | POA: Diagnosis not present

## 2019-06-21 DIAGNOSIS — Z6834 Body mass index (BMI) 34.0-34.9, adult: Secondary | ICD-10-CM | POA: Diagnosis not present

## 2019-06-21 DIAGNOSIS — I10 Essential (primary) hypertension: Secondary | ICD-10-CM | POA: Diagnosis not present

## 2019-06-29 ENCOUNTER — Other Ambulatory Visit (HOSPITAL_COMMUNITY): Payer: Self-pay | Admitting: Internal Medicine

## 2019-06-29 DIAGNOSIS — Z1231 Encounter for screening mammogram for malignant neoplasm of breast: Secondary | ICD-10-CM

## 2019-07-12 DIAGNOSIS — Z6834 Body mass index (BMI) 34.0-34.9, adult: Secondary | ICD-10-CM | POA: Diagnosis not present

## 2019-07-12 DIAGNOSIS — B354 Tinea corporis: Secondary | ICD-10-CM | POA: Diagnosis not present

## 2019-07-12 DIAGNOSIS — E6609 Other obesity due to excess calories: Secondary | ICD-10-CM | POA: Diagnosis not present

## 2019-07-12 DIAGNOSIS — G894 Chronic pain syndrome: Secondary | ICD-10-CM | POA: Diagnosis not present

## 2019-08-08 ENCOUNTER — Encounter: Payer: Self-pay | Admitting: Radiology

## 2019-08-21 DIAGNOSIS — I10 Essential (primary) hypertension: Secondary | ICD-10-CM | POA: Diagnosis not present

## 2019-08-21 DIAGNOSIS — Z6834 Body mass index (BMI) 34.0-34.9, adult: Secondary | ICD-10-CM | POA: Diagnosis not present

## 2019-08-21 DIAGNOSIS — M1991 Primary osteoarthritis, unspecified site: Secondary | ICD-10-CM | POA: Diagnosis not present

## 2019-08-21 DIAGNOSIS — G894 Chronic pain syndrome: Secondary | ICD-10-CM | POA: Diagnosis not present

## 2019-08-21 DIAGNOSIS — E063 Autoimmune thyroiditis: Secondary | ICD-10-CM | POA: Diagnosis not present

## 2019-08-21 DIAGNOSIS — M2550 Pain in unspecified joint: Secondary | ICD-10-CM | POA: Diagnosis not present

## 2019-08-21 DIAGNOSIS — E6609 Other obesity due to excess calories: Secondary | ICD-10-CM | POA: Diagnosis not present

## 2019-09-19 DIAGNOSIS — E6609 Other obesity due to excess calories: Secondary | ICD-10-CM | POA: Diagnosis not present

## 2019-09-19 DIAGNOSIS — Z6834 Body mass index (BMI) 34.0-34.9, adult: Secondary | ICD-10-CM | POA: Diagnosis not present

## 2019-09-19 DIAGNOSIS — G894 Chronic pain syndrome: Secondary | ICD-10-CM | POA: Diagnosis not present

## 2019-10-19 DIAGNOSIS — M2550 Pain in unspecified joint: Secondary | ICD-10-CM | POA: Diagnosis not present

## 2019-10-19 DIAGNOSIS — E6609 Other obesity due to excess calories: Secondary | ICD-10-CM | POA: Diagnosis not present

## 2019-10-19 DIAGNOSIS — L1089 Other pemphigus: Secondary | ICD-10-CM | POA: Diagnosis not present

## 2019-10-19 DIAGNOSIS — R5383 Other fatigue: Secondary | ICD-10-CM | POA: Diagnosis not present

## 2019-10-19 DIAGNOSIS — E063 Autoimmune thyroiditis: Secondary | ICD-10-CM | POA: Diagnosis not present

## 2019-10-19 DIAGNOSIS — G894 Chronic pain syndrome: Secondary | ICD-10-CM | POA: Diagnosis not present

## 2019-10-19 DIAGNOSIS — I700 Atherosclerosis of aorta: Secondary | ICD-10-CM | POA: Diagnosis not present

## 2019-10-19 DIAGNOSIS — K219 Gastro-esophageal reflux disease without esophagitis: Secondary | ICD-10-CM | POA: Diagnosis not present

## 2019-10-19 DIAGNOSIS — I10 Essential (primary) hypertension: Secondary | ICD-10-CM | POA: Diagnosis not present

## 2019-10-19 DIAGNOSIS — Z6834 Body mass index (BMI) 34.0-34.9, adult: Secondary | ICD-10-CM | POA: Diagnosis not present

## 2019-11-15 DIAGNOSIS — I10 Essential (primary) hypertension: Secondary | ICD-10-CM | POA: Diagnosis not present

## 2019-11-15 DIAGNOSIS — H539 Unspecified visual disturbance: Secondary | ICD-10-CM | POA: Diagnosis not present

## 2019-11-15 DIAGNOSIS — I700 Atherosclerosis of aorta: Secondary | ICD-10-CM | POA: Diagnosis not present

## 2019-11-15 DIAGNOSIS — L8990 Pressure ulcer of unspecified site, unspecified stage: Secondary | ICD-10-CM | POA: Diagnosis not present

## 2019-11-15 DIAGNOSIS — G894 Chronic pain syndrome: Secondary | ICD-10-CM | POA: Diagnosis not present

## 2019-11-15 DIAGNOSIS — Z6834 Body mass index (BMI) 34.0-34.9, adult: Secondary | ICD-10-CM | POA: Diagnosis not present

## 2019-11-15 DIAGNOSIS — M1991 Primary osteoarthritis, unspecified site: Secondary | ICD-10-CM | POA: Diagnosis not present

## 2019-11-15 DIAGNOSIS — E669 Obesity, unspecified: Secondary | ICD-10-CM | POA: Diagnosis not present

## 2019-11-15 DIAGNOSIS — H53143 Visual discomfort, bilateral: Secondary | ICD-10-CM | POA: Diagnosis not present

## 2019-11-15 DIAGNOSIS — K219 Gastro-esophageal reflux disease without esophagitis: Secondary | ICD-10-CM | POA: Diagnosis not present

## 2019-12-04 ENCOUNTER — Encounter (HOSPITAL_COMMUNITY): Payer: Self-pay | Admitting: Physical Therapy

## 2019-12-04 ENCOUNTER — Other Ambulatory Visit: Payer: Self-pay

## 2019-12-04 ENCOUNTER — Ambulatory Visit (HOSPITAL_COMMUNITY): Payer: Medicare Other | Attending: Internal Medicine | Admitting: Physical Therapy

## 2019-12-04 DIAGNOSIS — L89324 Pressure ulcer of left buttock, stage 4: Secondary | ICD-10-CM

## 2019-12-04 DIAGNOSIS — M7918 Myalgia, other site: Secondary | ICD-10-CM | POA: Diagnosis not present

## 2019-12-04 NOTE — Therapy (Signed)
[name], [place], [address] Phone: [phone]   Fax:  [phone]  Wound Care Evaluation  Patient Details  Name: [name] MRN: [id number] Date of Birth: [birth year] No data recorded  Encounter Date: 12/04/2019  PT End of Session - 12/04/19 1604    Visit Number  1    Number of Visits  12    Date for PT Re-Evaluation  01/15/20    Progress Note Due on Visit  10    PT Start Time  1450    PT Stop Time  1525    PT Time Calculation (min)  35 min    Activity Tolerance  Patient tolerated treatment well       Past Medical History:  Diagnosis Date  . Diabetes mellitus   . Fibromyalgia   . GERD (gastroesophageal reflux disease)   . Lupus ([place])   . Other and unspecified hyperlipidemia 12/16/2010  . Other chronic pain 12/16/2010  . Thyroid disease   . Unspecified essential hypertension 12/16/2010    Past Surgical History:  Procedure Laterality Date  . ABDOMINAL HYSTERECTOMY  30 yrs ago  . CATARACT EXTRACTION W/PHACO  09/20/2011   Procedure: CATARACT EXTRACTION PHACO AND INTRAOCULAR LENS PLACEMENT (IOC);  Surgeon: Sandra Branch, MD;  Location: [facility];  Service: Ophthalmology;  Laterality: Left;  CDE:9:14  . CATARACT EXTRACTION W/PHACO  10/04/2011   Procedure: CATARACT EXTRACTION PHACO AND INTRAOCULAR LENS PLACEMENT (IOC);  Surgeon: Sandra Branch, MD;  Location: [facility];  Service: Ophthalmology;  Laterality: Right;  CDE:11.66  . CHOLECYSTECTOMY    . COLONOSCOPY WITH PROPOFOL N/A 07/21/2018   Procedure: COLONOSCOPY WITH PROPOFOL;  Surgeon: Sandra Houston, MD;  Location: [facility];  Service: Endoscopy;  Laterality: N/A;  10:55  . SPINAL FIXATION SURGERY      There were no vitals filed for this visit.     Wound Therapy - 12/04/19 1540    Subjective  PT states that she has had the wound on her Lt cheek for months now.  There is normally minimal drainage, she states that the Rt cheek began to have a wound but it  seemed to clear up.  [name] states that she has lupus and has always had skin issues since her diagnosis.  At this time she states that she tries to stay off her back, however, she is a back sleeper and has not been able to sleep on her side.     Patient and Family Stated Goals  wound to heal    Date of Onset  08/27/19    Prior Treatments  self care     Pain Scale  0-10    Pain Score  6     Pain Type  Acute pain    Pain Location  Buttocks    Pain Orientation  Left    Pain Descriptors / Indicators  Aching;Discomfort    Pain Onset  Other (Comment)   positional, sitting or lying on her back   Patients Stated Pain Goal  0    Pain Intervention(s)  Distraction;Emotional support    Evaluation and Treatment Procedures Explained to Patient/Family  Yes    Evaluation and Treatment Procedures  agreed to    Pressure Injury Properties Date First Assessed: 12/04/19 Time First Assessed: 1500 for this facility:  However, this was in pt chart noting wound has been there since 2019:   Location: Buttocks Location Orientation: Left;Right Staging: Deep Tissue Pressure Injury - Purple  or maroon localized area of discolored intact skin or blood-filled blister due to damage of underlying soft tissue from pressure and/or shear. Wound Description (Comments): dime size tissue damage on right butt cheek and pea size tissue damage on left butt cheek noted in chart on 07/21/2018; today, 12/04/2019  Rt buttocks has deep purple discoloration 8x5cm; The Lt now has a wound that is 1.3 x 2.3 x.2 with deep purple discoloradion 2 cm superior, 4 cm inferior, 1 cm medial/lateral  Present on Admission:    Dressing Type  None    State of Healing  Non-healing    Site / Wound Assessment  Dry;Friable    % Wound base Red or Granulating  95%   red but not granulized    % Wound base Yellow/Fibrinous Exudate  5%    Peri-wound Assessment  Purple    Wound Length (cm)  1.3 cm    Wound Width (cm)  2.3 cm    Wound Depth (cm)  0.2 cm     Wound Surface Area (cm^2)  2.99 cm^2    Wound Volume (cm^3)  0.6 cm^3    Margins  Epibole (rolled edges)    Drainage Amount  Scant    Treatment  Cleansed;Debridement (Selective)    Selective Debridement - Location  wound bed and edges     Selective Debridement - Tools Used  Forceps    Selective Debridement - Tissue Removed  biofilm     Wound Therapy - Clinical Statement  [name] is a 74 yo female who has a hx of Lupus, DM, and spinal surgery who has had a wound on her Rt buttock for several years.   The wound is surrounded by dark purple discoloration indicative of deep tissue injury, the pt does not have a wound on the Rt, however there is the deep purple discoloration and an area of very fabrile skin which we will monitor.  [name] wound edges are epibloed and there is a biofilm along the base of the wound.  [name] will benefit from skilled PT to relieve the wound of the epiboled edges and the biofilm to provide a healing environment and to decrease her pain. ,     Wound Therapy - Functional Problem List  unable to sit or lie on her back without increased pain.    Factors Delaying/Impairing Wound Healing  Altered sensation;Diabetes Mellitus;Multiple medical problems    Hydrotherapy Plan  Debridement;Dressing change;Patient/family education    Wound Therapy - Frequency  2X / week   for 6 weeks    Wound Therapy - Current Recommendations  PT    Wound Plan  PT to be seen for wound care to include using silver nitrate stick on lateral edge of epiboled area of wound as it is very adherent,  pt education, debridement and dressing change.     Dressing   medihoney with profore tape over area to secure honey.              Objective measurements completed on examination: See above findings.            PT Education - 12/04/19 1602    Education Details  Pt instructed in glut sets to improve circulation to area, instructed to try and put a pillow between her legs, one behind and in  front of her for sleeping as she must get off this area.    Person(s) Educated  Patient    Methods  Explanation    Comprehension  Verbalized  understanding       PT Short Term Goals - 12/04/19 1609      PT SHORT TERM GOAL #1   Title  Pt to state that she has found a way of sleeping that does not require her to be on her back to decrease pressure on buttock area.    Time  3    Period  Weeks    Status  New    Target Date  12/25/19      PT SHORT TERM GOAL #2   Title  PT to states that she is only sitting to eat, viewing tv, reading are all done side-lying to decrease pressure on wound site.    Time  3    Period  Weeks      PT SHORT TERM GOAL #3   Title  Pt wound to have decreased to no greater than 1x1.8x .1    Time  3    Period  Weeks        PT Long Term Goals - 12/04/19 1613      PT LONG TERM GOAL #1   Title  Pt to understand that she has deep tissue damage that will need months of healing and that she will need to keep pressure off of her buttock to allow this to occur.    Time  6    Period  Weeks    Status  New    Target Date  01/15/20      PT LONG TERM GOAL #2   Title  PT wound to be healed    Time  6    Period  Weeks           Plan - 12/04/19 1605    Clinical Impression Statement  Please see above.    Personal Factors and Comorbidities  Age;Time since onset of injury/illness/exacerbation;Fitness;Past/Current Experience;Comorbidity 2    Comorbidities  DM, Lupus    Examination-Activity Limitations  Sleep;Sit    Examination-Participation Restrictions  Community Activity;Driving;Other    Stability/Clinical Decision Making  Stable/Uncomplicated    Clinical Decision Making  Low    Rehab Potential  Fair    PT Frequency  2x / week    PT Duration  6 weeks    PT Treatment/Interventions  ADLs/Self Care Home Management;Other (comment)   debridement and dressing change.   PT Next Visit Plan  use of silver nitrate stick on epiboled edges as needed, debridement and  dressing change.       Patient will benefit from skilled therapeutic intervention in order to improve the following deficits and impairments:  Pain, Decreased skin integrity  Visit Diagnosis: Pressure ulcer of left buttock, stage 4 ([facility])  Bilateral buttock pain    Problem List Patient Active Problem List   Diagnosis Date Noted  . Special screening for malignant neoplasms, colon 06/12/2018  . Fibromyalgia 01/05/2016  . Hypothyroidism 01/05/2016  . Rectal bleeding 01/05/2016  . Diarrhea 01/05/2016  . KNEE, ARTHRITIS, DEGEN./OSTEO 09/30/2010    Rayetta Humphrey, PT CLT [phone] 12/04/2019, 4:17 PM  [place] [address] [place], [place], [address] Phone: [phone]   Fax:  [phone]  Name: [name] MRN: [id number] Date of Birth: [birth year]

## 2019-12-06 ENCOUNTER — Telehealth (HOSPITAL_COMMUNITY): Payer: Self-pay

## 2019-12-06 ENCOUNTER — Ambulatory Visit (HOSPITAL_COMMUNITY): Payer: Medicare Other

## 2019-12-06 NOTE — Telephone Encounter (Signed)
Called Dr Nolon Rod office and spoke to [place] concerning need for silver nitrate treatment.  [name] reports ability to do treatment on small areas.  Will discuss setting up apt with pt. later today during session.    Ihor Austin, LPTA/CLT; Delana Meyer [phone]

## 2019-12-06 NOTE — Telephone Encounter (Signed)
pt cancelled appt for today, no reason given 

## 2019-12-10 ENCOUNTER — Telehealth (HOSPITAL_COMMUNITY): Payer: Self-pay | Admitting: Physical Therapy

## 2019-12-10 NOTE — Telephone Encounter (Signed)
pt cancelled appt for tomorrow, no reason given 

## 2019-12-11 ENCOUNTER — Ambulatory Visit (HOSPITAL_COMMUNITY): Payer: Medicare Other | Admitting: Physical Therapy

## 2019-12-13 ENCOUNTER — Telehealth (HOSPITAL_COMMUNITY): Payer: Self-pay

## 2019-12-13 ENCOUNTER — Ambulatory Visit (HOSPITAL_COMMUNITY): Payer: Medicare Other | Attending: Internal Medicine

## 2019-12-13 DIAGNOSIS — E6609 Other obesity due to excess calories: Secondary | ICD-10-CM | POA: Diagnosis not present

## 2019-12-13 DIAGNOSIS — M1991 Primary osteoarthritis, unspecified site: Secondary | ICD-10-CM | POA: Diagnosis not present

## 2019-12-13 DIAGNOSIS — M3210 Systemic lupus erythematosus, organ or system involvement unspecified: Secondary | ICD-10-CM | POA: Diagnosis not present

## 2019-12-13 DIAGNOSIS — E782 Mixed hyperlipidemia: Secondary | ICD-10-CM | POA: Diagnosis not present

## 2019-12-13 DIAGNOSIS — L109 Pemphigus, unspecified: Secondary | ICD-10-CM | POA: Diagnosis not present

## 2019-12-13 DIAGNOSIS — I10 Essential (primary) hypertension: Secondary | ICD-10-CM | POA: Diagnosis not present

## 2019-12-13 DIAGNOSIS — Z6835 Body mass index (BMI) 35.0-35.9, adult: Secondary | ICD-10-CM | POA: Diagnosis not present

## 2019-12-13 DIAGNOSIS — G894 Chronic pain syndrome: Secondary | ICD-10-CM | POA: Diagnosis not present

## 2019-12-13 NOTE — Telephone Encounter (Signed)
No show, called and left message concerning missed apt today.  Included next apt date and time with contact information provided.  Ihor Austin, LPTA/CLT; Delana Meyer [phone]

## 2019-12-17 ENCOUNTER — Telehealth (HOSPITAL_COMMUNITY): Payer: Self-pay

## 2019-12-17 NOTE — Telephone Encounter (Signed)
pt cancelled appt for 4/13, no reason given

## 2019-12-18 ENCOUNTER — Ambulatory Visit (HOSPITAL_COMMUNITY): Payer: Medicare Other

## 2019-12-20 ENCOUNTER — Encounter (HOSPITAL_COMMUNITY): Payer: Self-pay | Admitting: Physical Therapy

## 2019-12-20 ENCOUNTER — Ambulatory Visit (HOSPITAL_COMMUNITY): Payer: Medicare Other | Admitting: Physical Therapy

## 2019-12-20 NOTE — Therapy (Signed)
[place], [place], [address] Phone: [phone]   Fax:  [phone]  Patient Details  Name: [name] MRN: [id number] Date of Birth: Jun 26, 1946 Referring Provider:  No ref. provider found  Encounter Date: 12/20/2019  Contacted pt re appointment. Pt has not been been to this facility since the evaluation on 12/04/3019.  Pt states that the wound is draining and she is afraid that the drainage will get onto her clothes.  Therapist requested pt to call her MD and let him know that she need home health nursing care for her wound.  12/20/2019, 3:37 PM  [place] [address] [place], [place], [address] Phone: [phone]   Fax:  [phone]

## 2019-12-25 ENCOUNTER — Ambulatory Visit (HOSPITAL_COMMUNITY): Payer: Medicare Other | Admitting: Physical Therapy

## 2019-12-27 ENCOUNTER — Ambulatory Visit (HOSPITAL_COMMUNITY): Payer: Medicare Other

## 2019-12-31 ENCOUNTER — Other Ambulatory Visit: Payer: Self-pay

## 2019-12-31 ENCOUNTER — Ambulatory Visit (HOSPITAL_COMMUNITY)
Admission: RE | Admit: 2019-12-31 | Discharge: 2019-12-31 | Disposition: A | Discharge status: Home / Self Care | Payer: Medicare Other | Source: Ambulatory Visit | Attending: Internal Medicine | Admitting: Internal Medicine

## 2019-12-31 DIAGNOSIS — Z1231 Encounter for screening mammogram for malignant neoplasm of breast: Secondary | ICD-10-CM | POA: Diagnosis present

## 2020-01-01 ENCOUNTER — Ambulatory Visit (HOSPITAL_COMMUNITY): Payer: Medicare Other | Admitting: Physical Therapy

## 2020-01-03 ENCOUNTER — Ambulatory Visit (HOSPITAL_COMMUNITY): Payer: Medicare Other

## 2020-01-08 ENCOUNTER — Ambulatory Visit (HOSPITAL_COMMUNITY): Payer: Medicare Other | Admitting: Physical Therapy

## 2020-01-10 ENCOUNTER — Ambulatory Visit (HOSPITAL_COMMUNITY): Payer: Medicare Other | Admitting: Physical Therapy

## 2020-01-11 DIAGNOSIS — I700 Atherosclerosis of aorta: Secondary | ICD-10-CM | POA: Diagnosis not present

## 2020-01-11 DIAGNOSIS — G894 Chronic pain syndrome: Secondary | ICD-10-CM | POA: Diagnosis not present

## 2020-01-11 DIAGNOSIS — E669 Obesity, unspecified: Secondary | ICD-10-CM | POA: Diagnosis not present

## 2020-01-11 DIAGNOSIS — I10 Essential (primary) hypertension: Secondary | ICD-10-CM | POA: Diagnosis not present

## 2020-01-11 DIAGNOSIS — M1991 Primary osteoarthritis, unspecified site: Secondary | ICD-10-CM | POA: Diagnosis not present

## 2020-01-11 DIAGNOSIS — B372 Candidiasis of skin and nail: Secondary | ICD-10-CM | POA: Diagnosis not present

## 2020-01-11 DIAGNOSIS — Z6834 Body mass index (BMI) 34.0-34.9, adult: Secondary | ICD-10-CM | POA: Diagnosis not present

## 2020-01-15 ENCOUNTER — Ambulatory Visit (HOSPITAL_COMMUNITY): Payer: Medicare Other

## 2020-01-17 ENCOUNTER — Ambulatory Visit (HOSPITAL_COMMUNITY): Payer: Medicare Other | Admitting: Physical Therapy

## 2020-02-08 DIAGNOSIS — G894 Chronic pain syndrome: Secondary | ICD-10-CM | POA: Diagnosis not present

## 2020-02-08 DIAGNOSIS — Z6833 Body mass index (BMI) 33.0-33.9, adult: Secondary | ICD-10-CM | POA: Diagnosis not present

## 2020-02-08 DIAGNOSIS — E7849 Other hyperlipidemia: Secondary | ICD-10-CM | POA: Diagnosis not present

## 2020-02-08 DIAGNOSIS — I10 Essential (primary) hypertension: Secondary | ICD-10-CM | POA: Diagnosis not present

## 2020-02-08 DIAGNOSIS — K219 Gastro-esophageal reflux disease without esophagitis: Secondary | ICD-10-CM | POA: Diagnosis not present

## 2020-04-07 DIAGNOSIS — Z1389 Encounter for screening for other disorder: Secondary | ICD-10-CM | POA: Diagnosis not present

## 2020-04-07 DIAGNOSIS — R197 Diarrhea, unspecified: Secondary | ICD-10-CM | POA: Diagnosis not present

## 2020-04-07 DIAGNOSIS — Z6833 Body mass index (BMI) 33.0-33.9, adult: Secondary | ICD-10-CM | POA: Diagnosis not present

## 2020-04-07 DIAGNOSIS — R109 Unspecified abdominal pain: Secondary | ICD-10-CM | POA: Diagnosis not present

## 2020-04-07 DIAGNOSIS — G894 Chronic pain syndrome: Secondary | ICD-10-CM | POA: Diagnosis not present

## 2020-04-07 DIAGNOSIS — M041 Periodic fever syndromes: Secondary | ICD-10-CM | POA: Diagnosis not present

## 2020-04-07 DIAGNOSIS — I10 Essential (primary) hypertension: Secondary | ICD-10-CM | POA: Diagnosis not present

## 2020-04-07 DIAGNOSIS — R55 Syncope and collapse: Secondary | ICD-10-CM | POA: Diagnosis not present

## 2020-05-08 DIAGNOSIS — Z6832 Body mass index (BMI) 32.0-32.9, adult: Secondary | ICD-10-CM | POA: Diagnosis not present

## 2020-05-08 DIAGNOSIS — E6609 Other obesity due to excess calories: Secondary | ICD-10-CM | POA: Diagnosis not present

## 2020-05-08 DIAGNOSIS — G894 Chronic pain syndrome: Secondary | ICD-10-CM | POA: Diagnosis not present

## 2020-05-08 DIAGNOSIS — I700 Atherosclerosis of aorta: Secondary | ICD-10-CM | POA: Diagnosis not present

## 2020-06-05 DIAGNOSIS — I10 Essential (primary) hypertension: Secondary | ICD-10-CM | POA: Diagnosis not present

## 2020-06-05 DIAGNOSIS — K219 Gastro-esophageal reflux disease without esophagitis: Secondary | ICD-10-CM | POA: Diagnosis not present

## 2020-06-05 DIAGNOSIS — G894 Chronic pain syndrome: Secondary | ICD-10-CM | POA: Diagnosis not present

## 2020-07-04 DIAGNOSIS — F329 Major depressive disorder, single episode, unspecified: Secondary | ICD-10-CM | POA: Diagnosis not present

## 2020-07-04 DIAGNOSIS — Z1389 Encounter for screening for other disorder: Secondary | ICD-10-CM | POA: Diagnosis not present

## 2020-07-04 DIAGNOSIS — M3210 Systemic lupus erythematosus, organ or system involvement unspecified: Secondary | ICD-10-CM | POA: Diagnosis not present

## 2020-07-04 DIAGNOSIS — Z6832 Body mass index (BMI) 32.0-32.9, adult: Secondary | ICD-10-CM | POA: Diagnosis not present

## 2020-07-04 DIAGNOSIS — H01003 Unspecified blepharitis right eye, unspecified eyelid: Secondary | ICD-10-CM | POA: Diagnosis not present

## 2020-07-04 DIAGNOSIS — I10 Essential (primary) hypertension: Secondary | ICD-10-CM | POA: Diagnosis not present

## 2020-07-04 DIAGNOSIS — E063 Autoimmune thyroiditis: Secondary | ICD-10-CM | POA: Diagnosis not present

## 2020-07-04 DIAGNOSIS — F419 Anxiety disorder, unspecified: Secondary | ICD-10-CM | POA: Diagnosis not present

## 2020-07-04 DIAGNOSIS — E7849 Other hyperlipidemia: Secondary | ICD-10-CM | POA: Diagnosis not present

## 2020-07-04 DIAGNOSIS — M1991 Primary osteoarthritis, unspecified site: Secondary | ICD-10-CM | POA: Diagnosis not present

## 2020-07-04 DIAGNOSIS — Z0000 Encounter for general adult medical examination without abnormal findings: Secondary | ICD-10-CM | POA: Diagnosis not present

## 2020-07-04 DIAGNOSIS — E6609 Other obesity due to excess calories: Secondary | ICD-10-CM | POA: Diagnosis not present

## 2020-07-04 DIAGNOSIS — G894 Chronic pain syndrome: Secondary | ICD-10-CM | POA: Diagnosis not present

## 2020-07-04 DIAGNOSIS — I700 Atherosclerosis of aorta: Secondary | ICD-10-CM | POA: Diagnosis not present

## 2020-08-04 ENCOUNTER — Emergency Department (HOSPITAL_COMMUNITY)
Admission: EM | Admit: 2020-08-04 | Discharge: 2020-08-05 | Disposition: A | Discharge status: Home / Self Care | Payer: Medicare Other | Attending: Emergency Medicine | Admitting: Emergency Medicine

## 2020-08-04 ENCOUNTER — Emergency Department (HOSPITAL_COMMUNITY): Payer: Medicare Other

## 2020-08-04 ENCOUNTER — Other Ambulatory Visit: Payer: Self-pay

## 2020-08-04 ENCOUNTER — Encounter (HOSPITAL_COMMUNITY): Payer: Self-pay

## 2020-08-04 DIAGNOSIS — Z79899 Other long term (current) drug therapy: Secondary | ICD-10-CM | POA: Insufficient documentation

## 2020-08-04 DIAGNOSIS — R197 Diarrhea, unspecified: Secondary | ICD-10-CM

## 2020-08-04 DIAGNOSIS — N3289 Other specified disorders of bladder: Secondary | ICD-10-CM | POA: Diagnosis not present

## 2020-08-04 DIAGNOSIS — R109 Unspecified abdominal pain: Secondary | ICD-10-CM | POA: Insufficient documentation

## 2020-08-04 DIAGNOSIS — E119 Type 2 diabetes mellitus without complications: Secondary | ICD-10-CM | POA: Diagnosis not present

## 2020-08-04 DIAGNOSIS — M791 Myalgia, unspecified site: Secondary | ICD-10-CM

## 2020-08-04 DIAGNOSIS — N281 Cyst of kidney, acquired: Secondary | ICD-10-CM | POA: Diagnosis not present

## 2020-08-04 DIAGNOSIS — R519 Headache, unspecified: Secondary | ICD-10-CM | POA: Diagnosis present

## 2020-08-04 DIAGNOSIS — I10 Essential (primary) hypertension: Secondary | ICD-10-CM | POA: Insufficient documentation

## 2020-08-04 DIAGNOSIS — Z20822 Contact with and (suspected) exposure to covid-19: Secondary | ICD-10-CM | POA: Diagnosis not present

## 2020-08-04 DIAGNOSIS — K5730 Diverticulosis of large intestine without perforation or abscess without bleeding: Secondary | ICD-10-CM | POA: Diagnosis not present

## 2020-08-04 DIAGNOSIS — Z87891 Personal history of nicotine dependence: Secondary | ICD-10-CM | POA: Insufficient documentation

## 2020-08-04 DIAGNOSIS — R531 Weakness: Secondary | ICD-10-CM | POA: Insufficient documentation

## 2020-08-04 DIAGNOSIS — F1123 Opioid dependence with withdrawal: Secondary | ICD-10-CM

## 2020-08-04 DIAGNOSIS — F19230 Other psychoactive substance dependence with withdrawal, uncomplicated: Secondary | ICD-10-CM | POA: Diagnosis not present

## 2020-08-04 DIAGNOSIS — F1193 Opioid use, unspecified with withdrawal: Secondary | ICD-10-CM

## 2020-08-04 LAB — RESP PANEL BY RT-PCR (FLU A&B, COVID) ARPGX2
Influenza A by PCR: NEGATIVE
Influenza B by PCR: NEGATIVE
SARS Coronavirus 2 by RT PCR: NEGATIVE

## 2020-08-04 LAB — COMPREHENSIVE METABOLIC PANEL
ALT: 17 U/L (ref 0–44)
AST: 24 U/L (ref 15–41)
Albumin: 4.2 g/dL (ref 3.5–5.0)
Alkaline Phosphatase: 77 U/L (ref 38–126)
Anion gap: 9 (ref 5–15)
BUN: 15 mg/dL (ref 8–23)
CO2: 22 mmol/L (ref 22–32)
Calcium: 10.1 mg/dL (ref 8.9–10.3)
Chloride: 107 mmol/L (ref 98–111)
Creatinine, Ser: 1 mg/dL (ref 0.44–1.00)
GFR, Estimated: 59 mL/min — ABNORMAL LOW (ref >60)
Glucose, Bld: 113 mg/dL — ABNORMAL HIGH (ref 70–99)
Potassium: 3.3 mmol/L — ABNORMAL LOW (ref 3.5–5.1)
Sodium: 138 mmol/L (ref 135–145)
Total Bilirubin: 0.8 mg/dL (ref 0.3–1.2)
Total Protein: 8.3 g/dL — ABNORMAL HIGH (ref 6.5–8.1)

## 2020-08-04 LAB — CBC
HCT: 38.2 % (ref 36.0–46.0)
Hemoglobin: 12.3 g/dL (ref 12.0–15.0)
MCH: 29.4 pg (ref 26.0–34.0)
MCHC: 32.2 g/dL (ref 30.0–36.0)
MCV: 91.4 fL (ref 80.0–100.0)
Platelets: 215 10*3/uL (ref 150–400)
RBC: 4.18 MIL/uL (ref 3.87–5.11)
RDW: 12.9 % (ref 11.5–15.5)
WBC: 4.8 10*3/uL (ref 4.0–10.5)
nRBC: 0 % (ref 0.0–0.2)

## 2020-08-04 LAB — LIPASE, BLOOD: Lipase: 54 U/L — ABNORMAL HIGH (ref 11–51)

## 2020-08-04 MED ORDER — SODIUM CHLORIDE 0.9 % IV BOLUS
1000.0000 mL | Freq: Once | INTRAVENOUS | Status: AC
  Administered 2020-08-04: 1000 mL via INTRAVENOUS

## 2020-08-04 MED ORDER — IOHEXOL 300 MG/ML  SOLN
100.0000 mL | Freq: Once | INTRAMUSCULAR | Status: AC | PRN
  Administered 2020-08-04: 100 mL via INTRAVENOUS

## 2020-08-04 MED ORDER — ONDANSETRON HCL 4 MG/2ML IJ SOLN
4.0000 mg | Freq: Once | INTRAMUSCULAR | Status: AC
  Administered 2020-08-04: 4 mg via INTRAVENOUS
  Filled 2020-08-04: qty 2

## 2020-08-04 MED ORDER — FENTANYL CITRATE (PF) 100 MCG/2ML IJ SOLN
50.0000 ug | Freq: Once | INTRAMUSCULAR | Status: AC
  Administered 2020-08-04: 50 ug via INTRAVENOUS
  Filled 2020-08-04: qty 2

## 2020-08-04 NOTE — ED Triage Notes (Signed)
Pt to er, pt states that she has diarrhea, headache and general body aches.  Denies covid exposure, denies being vaccinated.

## 2020-08-04 NOTE — ED Provider Notes (Signed)
[facility] EMERGENCY DEPARTMENT Provider Note  CSN: [id number] Arrival date & time: 08/04/20 1317    History Chief Complaint  Patient presents with  . Headache  . Generalized Body Aches    HPI  [name] is a 74 y.o. female with history of multiple medical problems including Lupus and chronic pain managed by her PCP. She reports that she has had 4 days of general weakness, headache, diffuse body aches and multiple daily episodes of watery diarrhea. No reported fever. She has diffuse cramping abdominal pain. Denies vomiting. She is not out of her daily pain medications (Dilauid 4mg  QID).    Past Medical History:  Diagnosis Date  . Diabetes mellitus   . Fibromyalgia   . GERD (gastroesophageal reflux disease)   . Lupus ([place])   . Other and unspecified hyperlipidemia 12/16/2010  . Other chronic pain 12/16/2010  . Thyroid disease   . Unspecified essential hypertension 12/16/2010    Past Surgical History:  Procedure Laterality Date  . ABDOMINAL HYSTERECTOMY  30 yrs ago  . CATARACT EXTRACTION W/PHACO  09/20/2011   Procedure: CATARACT EXTRACTION PHACO AND INTRAOCULAR LENS PLACEMENT (IOC);  Surgeon: Tonny Branch, MD;  Location: [facility];  Service: Ophthalmology;  Laterality: Left;  CDE:9:14  . CATARACT EXTRACTION W/PHACO  10/04/2011   Procedure: CATARACT EXTRACTION PHACO AND INTRAOCULAR LENS PLACEMENT (IOC);  Surgeon: Tonny Branch, MD;  Location: [facility];  Service: Ophthalmology;  Laterality: Right;  CDE:11.66  . CHOLECYSTECTOMY    . COLONOSCOPY WITH PROPOFOL N/A 07/21/2018   Procedure: COLONOSCOPY WITH PROPOFOL;  Surgeon: Rogene Houston, MD;  Location: [facility];  Service: Endoscopy;  Laterality: N/A;  10:55  . SPINAL FIXATION SURGERY      Family History  Problem Relation Age of Onset  . Anesthesia problems Neg Hx   . Hypotension Neg Hx   . Malignant hyperthermia Neg Hx   . Pseudochol deficiency Neg Hx     Social History   Tobacco Use  . Smoking status: Former Smoker     Packs/day: 0.25    Years: 1.00    Pack years: 0.25    Types: Cigarettes    Quit date: 02/03/1971    Years since quitting: 49.5  . Smokeless tobacco: Former Systems developer    Quit date: 09/14/1964  Substance Use Topics  . Alcohol use: No  . Drug use: No     Home Medications Prior to Admission medications   Medication Sig Start Date End Date Taking? Authorizing Provider  bisoprolol (ZEBETA) 5 MG tablet Take 5 mg by mouth daily.    Yes [provider]  clotrimazole-betamethasone (LOTRISONE) cream Apply 1 application topically 2 (two) times daily as needed (for irritation).  05/30/18  Yes [provider]  HYDROmorphone (DILAUDID) 8 MG tablet Take 1-2 tablets (8-16 mg total) by mouth every 4 (four) hours as needed. pain Patient taking differently: Take 8-16 mg by mouth every 4 (four) hours as needed for severe pain.  10/01/12  Yes Dorie Rank, MD  levothyroxine (SYNTHROID, LEVOTHROID) 75 MCG tablet Take 75 mcg by mouth daily before breakfast.  02/23/18  Yes [provider]  metoCLOPramide (REGLAN) 5 MG tablet Take 5 mg by mouth 4 (four) times daily.   Yes [provider]  Multiple Vitamins-Minerals (WOMENS MULTIVITAMIN PO) Take 1 tablet by mouth daily.   Yes [provider]  mupirocin ointment (BACTROBAN) 2 % Apply 1 application topically 3 (three) times daily as needed (for burn).   Yes [provider]  neomycin-polymyxin b-dexamethasone (MAXITROL) 3.5-10000-0.1 SUSP Place 2 drops into the right eye every 6 (six) hours.  07/04/20  Yes [provider]  nystatin cream (MYCOSTATIN) Apply topically 3 (three) times daily. 06/20/20  Yes [provider]  pregabalin (LYRICA) 100 MG capsule Take 100 mg by mouth 3 (three) times daily.   Yes [provider]  simvastatin (ZOCOR) 40 MG tablet Take 40 mg by mouth at bedtime.    Yes [provider]     Allergies    Sulfa antibiotics and Cymbalta [duloxetine hcl]   Review of  Systems   Review of Systems A comprehensive review of systems was completed and negative except as noted in HPI.    Physical Exam BP (!) 174/79 (BP Location: Left Arm)   Pulse 61   Temp 98.1 F (36.7 C) (Oral)   Resp 17   Ht 4\' 11"  (1.499 m)   Wt 72.1 kg   SpO2 100%   BMI 32.11 kg/m   Physical Exam Vitals and nursing note reviewed.  Constitutional:      Appearance: Normal appearance.  HENT:     Head: Normocephalic and atraumatic.     Nose: Nose normal.     Mouth/Throat:     Mouth: Mucous membranes are moist.  Eyes:     Extraocular Movements: Extraocular movements intact.     Conjunctiva/sclera: Conjunctivae normal.  Cardiovascular:     Rate and Rhythm: Normal rate.  Pulmonary:     Effort: Pulmonary effort is normal.     Breath sounds: Normal breath sounds.  Abdominal:     General: Abdomen is flat.     Palpations: Abdomen is soft.     Tenderness: There is abdominal tenderness (diffuse). There is no guarding.  Musculoskeletal:        General: No swelling. Normal range of motion.     Cervical back: Neck supple.  Skin:    General: Skin is warm and dry.  Neurological:     General: No focal deficit present.     Mental Status: She is alert.  Psychiatric:        Mood and Affect: Mood normal.      ED Results / Procedures / Treatments   Labs (all labs ordered are listed, but only abnormal results are displayed) Labs Reviewed  LIPASE, BLOOD - Abnormal; Notable for the following components:      Result Value   Lipase 54 (*)    All other components within normal limits  COMPREHENSIVE METABOLIC PANEL - Abnormal; Notable for the following components:   Potassium 3.3 (*)    Glucose, Bld 113 (*)    Total Protein 8.3 (*)    GFR, Estimated 59 (*)    All other components within normal limits  RESP PANEL BY RT-PCR (FLU A&B, COVID) ARPGX2  C DIFFICILE QUICK SCREEN W PCR REFLEX  CBC  URINALYSIS, ROUTINE W REFLEX MICROSCOPIC    EKG None  Radiology No results  found.  Procedures Procedures  Medications Ordered in the ED Medications  sodium chloride 0.9 % bolus 1,000 mL (1,000 mLs Intravenous New Bag/Given (Non-Interop) 08/04/20 2247)  ondansetron (ZOFRAN) injection 4 mg (4 mg Intravenous Given 08/04/20 2248)  fentaNYL (SUBLIMAZE) injection 50 mcg (50 mcg Intravenous Given 08/04/20 2247)  iohexol (OMNIPAQUE) 300 MG/ML solution 100 mL (100 mLs Intravenous Contrast Given 08/04/20 2306)     MDM Rules/Calculators/A&P MDM CBC and CMP ordered in triage are unremarkable. Will add c-diff given frequent watery stools but with normal WBC this  is felt to be less likely. She has not yet given a urine specimen. Will send for CT Abd/Pel given her tenderness. IVF, pain/nausea meds ordered.  ED Course  I have reviewed the triage vital signs and the nursing notes.  Pertinent labs & imaging results that were available during my care of the patient were reviewed by me and considered in my medical decision making (see chart for details).  Clinical Course as of Aug 04 2313  Mon Aug 04, 2020  2312 Care of the patient signed out at the change of shift pending CT.    [CS]    Clinical Course User Index [CS] Truddie Hidden, MD    Final Clinical Impression(s) / ED Diagnoses Final diagnoses:  None    Rx / DC Orders ED Discharge Orders    None       Truddie Hidden, MD 08/04/20 2314

## 2020-08-05 DIAGNOSIS — G894 Chronic pain syndrome: Secondary | ICD-10-CM | POA: Diagnosis not present

## 2020-08-05 DIAGNOSIS — R197 Diarrhea, unspecified: Secondary | ICD-10-CM | POA: Diagnosis not present

## 2020-08-05 DIAGNOSIS — N3289 Other specified disorders of bladder: Secondary | ICD-10-CM | POA: Diagnosis not present

## 2020-08-05 DIAGNOSIS — N281 Cyst of kidney, acquired: Secondary | ICD-10-CM | POA: Diagnosis not present

## 2020-08-05 DIAGNOSIS — K5730 Diverticulosis of large intestine without perforation or abscess without bleeding: Secondary | ICD-10-CM | POA: Diagnosis not present

## 2020-08-05 DIAGNOSIS — F19230 Other psychoactive substance dependence with withdrawal, uncomplicated: Secondary | ICD-10-CM | POA: Diagnosis not present

## 2020-08-05 DIAGNOSIS — M1991 Primary osteoarthritis, unspecified site: Secondary | ICD-10-CM | POA: Diagnosis not present

## 2020-08-05 LAB — URINALYSIS, ROUTINE W REFLEX MICROSCOPIC
Bilirubin Urine: NEGATIVE
Glucose, UA: NEGATIVE mg/dL
Hgb urine dipstick: NEGATIVE
Ketones, ur: 5 mg/dL — AB
Nitrite: NEGATIVE
Protein, ur: 30 mg/dL — AB
Specific Gravity, Urine: 1.025 (ref 1.005–1.030)
pH: 5 (ref 5.0–8.0)

## 2020-08-05 LAB — C DIFFICILE QUICK SCREEN W PCR REFLEX
C Diff antigen: NEGATIVE
C Diff interpretation: NOT DETECTED
C Diff toxin: NEGATIVE

## 2020-08-05 MED ORDER — ONDANSETRON 8 MG PO TBDP
8.0000 mg | ORAL_TABLET | Freq: Once | ORAL | Status: AC
  Administered 2020-08-05: 8 mg via ORAL
  Filled 2020-08-05: qty 1

## 2020-08-05 MED ORDER — ONDANSETRON HCL 4 MG PO TABS: 4.0000 mg | ORAL_TABLET | Freq: Three times a day (TID) | ORAL | 0 refills | Status: AC | PRN

## 2020-08-05 MED ORDER — CLONIDINE HCL 0.1 MG PO TABS
0.1000 mg | ORAL_TABLET | Freq: Once | ORAL | Status: AC
  Administered 2020-08-05: 0.1 mg via ORAL
  Filled 2020-08-05: qty 1

## 2020-08-05 MED ORDER — METHOCARBAMOL 500 MG PO TABS
500.0000 mg | ORAL_TABLET | Freq: Once | ORAL | Status: AC
  Administered 2020-08-05: 500 mg via ORAL
  Filled 2020-08-05: qty 1

## 2020-08-05 MED ORDER — METHOCARBAMOL 500 MG PO TABS: 500.0000 mg | ORAL_TABLET | Freq: Three times a day (TID) | ORAL | 0 refills | Status: AC | PRN

## 2020-08-05 NOTE — ED Notes (Signed)
[place] IV infiltrated while at CT

## 2020-08-05 NOTE — Discharge Instructions (Addendum)
You will need to talk to Dr. Gerarda Fraction about getting back on your Dilaudid pain pills.  Use the Zofran for nausea or vomiting.  Take the Robaxin for the body aches.  You can take Imodium over-the-counter for diarrhea.

## 2020-08-05 NOTE — ED Provider Notes (Signed)
Patient was left at change of shift to get results of her CT scan.  Patient has a history of lupus and chronic pain is managed by her primary care doctor.  She complained of diffuse body aches and watery diarrhea.  She denies having any hard stools prior to the diarrhea.  I talked to the patient about her CT results she states she still is  Hurting and indicates her lower abdomen.  When I looked at the scan I do not see any obvious constipation.  At this point we discussed going home and I verified what had been told to me by Dr. Karle Starch the patient still had plenty of pain pills at home.  She states however she ran out yesterday.  So her symptoms are consistent with narcotic withdrawal.  Results for orders placed or performed during the hospital encounter of 08/04/20  Resp Panel by RT-PCR (Flu A&B, Covid) Nasopharyngeal Swab   Specimen: Nasopharyngeal Swab; Nasopharyngeal(NP) swabs in vial transport medium  Result Value Ref Range   SARS Coronavirus 2 by RT PCR NEGATIVE NEGATIVE   Influenza A by PCR NEGATIVE NEGATIVE   Influenza B by PCR NEGATIVE NEGATIVE  C Difficile Quick Screen w PCR reflex   Specimen: Stool  Result Value Ref Range   C Diff antigen NEGATIVE NEGATIVE   C Diff toxin NEGATIVE NEGATIVE   C Diff interpretation No C. difficile detected.   Lipase, blood  Result Value Ref Range   Lipase 54 (H) 11 - 51 U/L  Comprehensive metabolic panel  Result Value Ref Range   Sodium 138 135 - 145 mmol/L   Potassium 3.3 (L) 3.5 - 5.1 mmol/L   Chloride 107 98 - 111 mmol/L   CO2 22 22 - 32 mmol/L   Glucose, Bld 113 (H) 70 - 99 mg/dL   BUN 15 8 - 23 mg/dL   Creatinine, Ser 1.00 0.44 - 1.00 mg/dL   Calcium 10.1 8.9 - 10.3 mg/dL   Total Protein 8.3 (H) 6.5 - 8.1 g/dL   Albumin 4.2 3.5 - 5.0 g/dL   AST 24 15 - 41 U/L   ALT 17 0 - 44 U/L   Alkaline Phosphatase 77 38 - 126 U/L   Total Bilirubin 0.8 0.3 - 1.2 mg/dL   GFR, Estimated 59 (L) >60 mL/min   Anion gap 9 5 - 15  CBC  Result Value  Ref Range   WBC 4.8 4.0 - 10.5 K/uL   RBC 4.18 3.87 - 5.11 MIL/uL   Hemoglobin 12.3 12.0 - 15.0 g/dL   HCT 38.2 36 - 46 %   MCV 91.4 80.0 - 100.0 fL   MCH 29.4 26.0 - 34.0 pg   MCHC 32.2 30.0 - 36.0 g/dL   RDW 12.9 11.5 - 15.5 %   Platelets 215 150 - 400 K/uL   nRBC 0.0 0.0 - 0.2 %  Urinalysis, Routine w reflex microscopic  Result Value Ref Range   Color, Urine YELLOW YELLOW   APPearance HAZY (A) CLEAR   Specific Gravity, Urine 1.025 1.005 - 1.030   pH 5.0 5.0 - 8.0   Glucose, UA NEGATIVE NEGATIVE mg/dL   Hgb urine dipstick NEGATIVE NEGATIVE   Bilirubin Urine NEGATIVE NEGATIVE   Ketones, ur 5 (A) NEGATIVE mg/dL   Protein, ur 30 (A) NEGATIVE mg/dL   Nitrite NEGATIVE NEGATIVE   Leukocytes,Ua MODERATE (A) NEGATIVE   RBC / HPF 0-5 0 - 5 RBC/hpf   WBC, UA 11-20 0 - 5 WBC/hpf   Bacteria, UA  RARE (A) NONE SEEN   Squamous Epithelial / LPF 0-5 0 - 5   Mucus PRESENT    Laboratory interpretation all normal except mild hypokalemia, pyuria without positive nitrates, urine culture sent.   CT ABDOMEN PELVIS W CONTRAST  Result Date: 08/05/2020 CLINICAL DATA:  Diarrhea EXAM: CT ABDOMEN AND PELVIS WITH CONTRAST TECHNIQUE: Multidetector CT imaging of the abdomen and pelvis was performed using the standard protocol following bolus administration of intravenous contrast. CONTRAST:  154mL OMNIPAQUE IOHEXOL 300 MG/ML  SOLN COMPARISON:  04/24/2018 FINDINGS: Lower chest: No acute abnormality. Hepatobiliary: No focal liver abnormality is seen. Status post cholecystectomy. No biliary dilatation. Pancreas: Unremarkable. No pancreatic ductal dilatation or surrounding inflammatory changes. Spleen: Normal in size without focal abnormality. Adrenals/Urinary Tract: Adrenal glands are within normal limits bilaterally. Kidneys are well visualize within normal enhancement pattern. Excretion of contrast is noted. Right renal cyst is noted inferiorly slightly enlarged from the prior exam. No obstructive changes are  seen. The bladder is partially distended with contrast opacified urine. Stomach/Bowel: Scattered diverticular change of the colon is noted without evidence of diverticulitis. The appendix is not well visualized and may have been surgically removed. No inflammatory changes are seen. The stomach and small bowel are unremarkable. Vascular/Lymphatic: Aortic atherosclerosis. No enlarged abdominal or pelvic lymph nodes. Reproductive: Status post hysterectomy. No adnexal masses. Other: No abdominal wall hernia or abnormality. No abdominopelvic ascites. Musculoskeletal: No acute or significant osseous findings. IMPRESSION: Mild diverticular change without diverticulitis. Right renal cystic change. Other focal abnormality is noted. Electronically Signed   By: Inez Catalina M.D.   On: 08/05/2020 02:22   Diagnoses that have been ruled out:  None  Diagnoses that are still under consideration:  None  Final diagnoses:  Narcotic withdrawal ([place])  Myalgia  Diarrhea, unspecified type   ED Discharge Orders         Ordered    ondansetron (ZOFRAN) 4 MG tablet  Every 8 hours PRN        08/05/20 0300    methocarbamol (ROBAXIN) 500 MG tablet  Every 8 hours PRN        08/05/20 0303         Plan discharge  Rolland Porter, MD, Barbette Or, MD 08/05/20 [phone]

## 2020-09-02 DIAGNOSIS — K219 Gastro-esophageal reflux disease without esophagitis: Secondary | ICD-10-CM | POA: Diagnosis not present

## 2020-09-02 DIAGNOSIS — M1991 Primary osteoarthritis, unspecified site: Secondary | ICD-10-CM | POA: Diagnosis not present

## 2020-09-02 DIAGNOSIS — G894 Chronic pain syndrome: Secondary | ICD-10-CM | POA: Diagnosis not present

## 2020-09-02 DIAGNOSIS — E669 Obesity, unspecified: Secondary | ICD-10-CM | POA: Diagnosis not present

## 2020-09-26 DIAGNOSIS — Z6833 Body mass index (BMI) 33.0-33.9, adult: Secondary | ICD-10-CM | POA: Diagnosis not present

## 2020-09-26 DIAGNOSIS — G894 Chronic pain syndrome: Secondary | ICD-10-CM | POA: Diagnosis not present

## 2020-09-26 DIAGNOSIS — E063 Autoimmune thyroiditis: Secondary | ICD-10-CM | POA: Diagnosis not present

## 2020-09-26 DIAGNOSIS — R300 Dysuria: Secondary | ICD-10-CM | POA: Diagnosis not present

## 2020-09-26 DIAGNOSIS — N3000 Acute cystitis without hematuria: Secondary | ICD-10-CM | POA: Diagnosis not present

## 2020-09-26 DIAGNOSIS — M1991 Primary osteoarthritis, unspecified site: Secondary | ICD-10-CM | POA: Diagnosis not present

## 2020-09-26 DIAGNOSIS — I10 Essential (primary) hypertension: Secondary | ICD-10-CM | POA: Diagnosis not present

## 2020-09-26 DIAGNOSIS — Z1331 Encounter for screening for depression: Secondary | ICD-10-CM | POA: Diagnosis not present

## 2020-10-31 DIAGNOSIS — G894 Chronic pain syndrome: Secondary | ICD-10-CM | POA: Diagnosis not present

## 2020-10-31 DIAGNOSIS — E063 Autoimmune thyroiditis: Secondary | ICD-10-CM | POA: Diagnosis not present

## 2020-10-31 DIAGNOSIS — K219 Gastro-esophageal reflux disease without esophagitis: Secondary | ICD-10-CM | POA: Diagnosis not present

## 2020-11-14 IMAGING — MG DIGITAL SCREENING BILAT W/ TOMO W/ CAD
8 series · 8 of 24 positions shown · non-contrast
Comparison: Previous exam(s).

CLINICAL DATA: Screening.

EXAM:
DIGITAL SCREENING BILATERAL MAMMOGRAM WITH TOMO AND CAD

[R CC synth-2D]
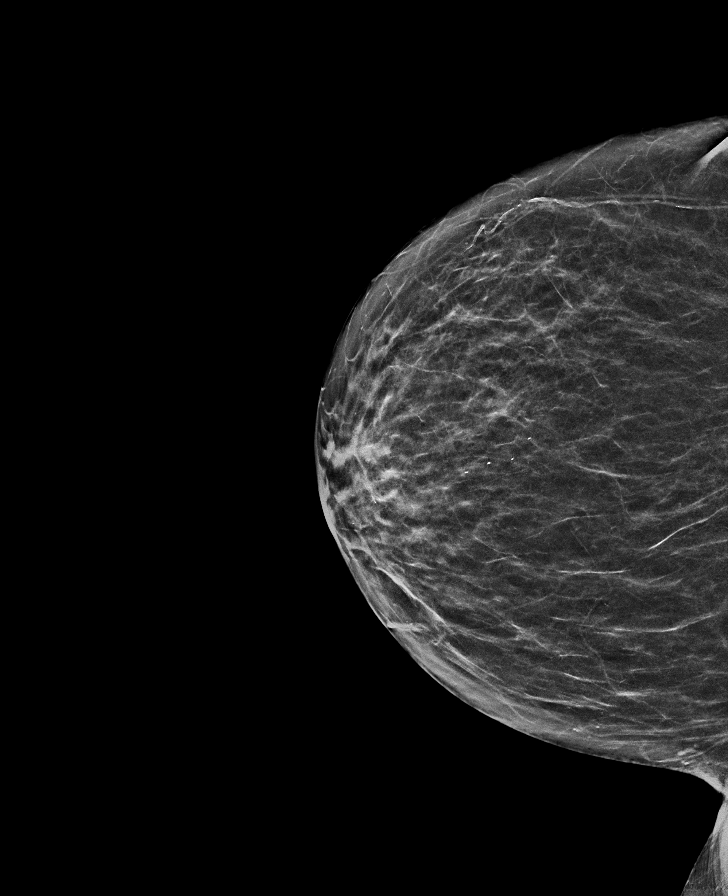

[L CC synth-2D]
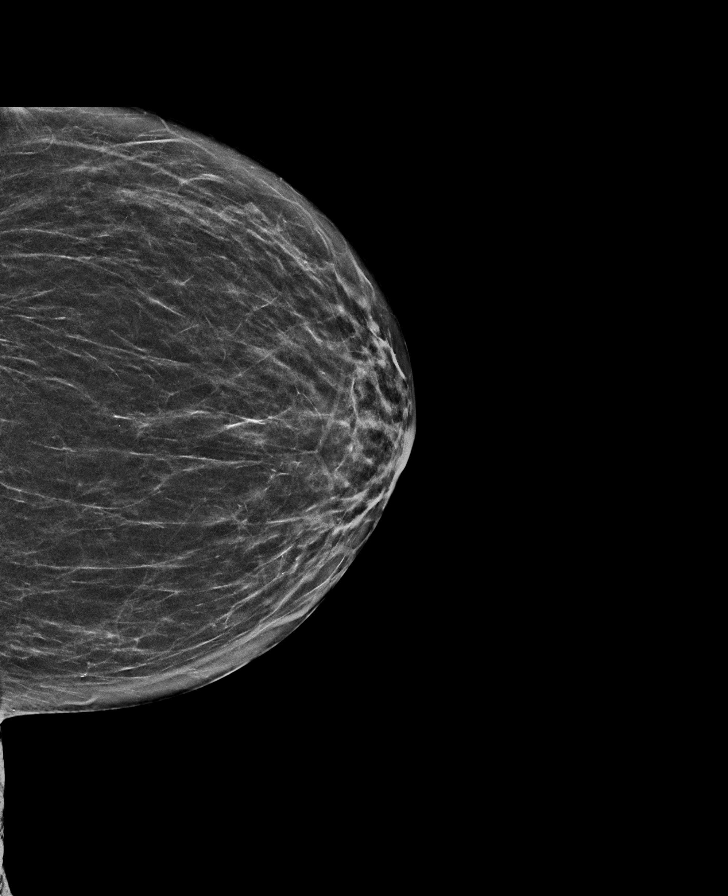

[R MLO synth-2D]
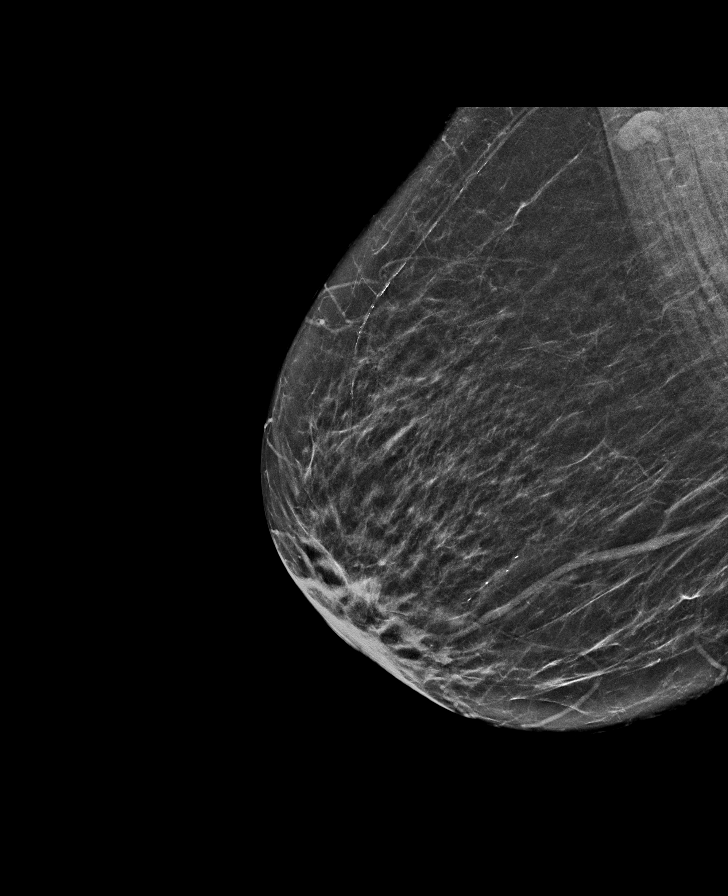

[L MLO synth-2D]
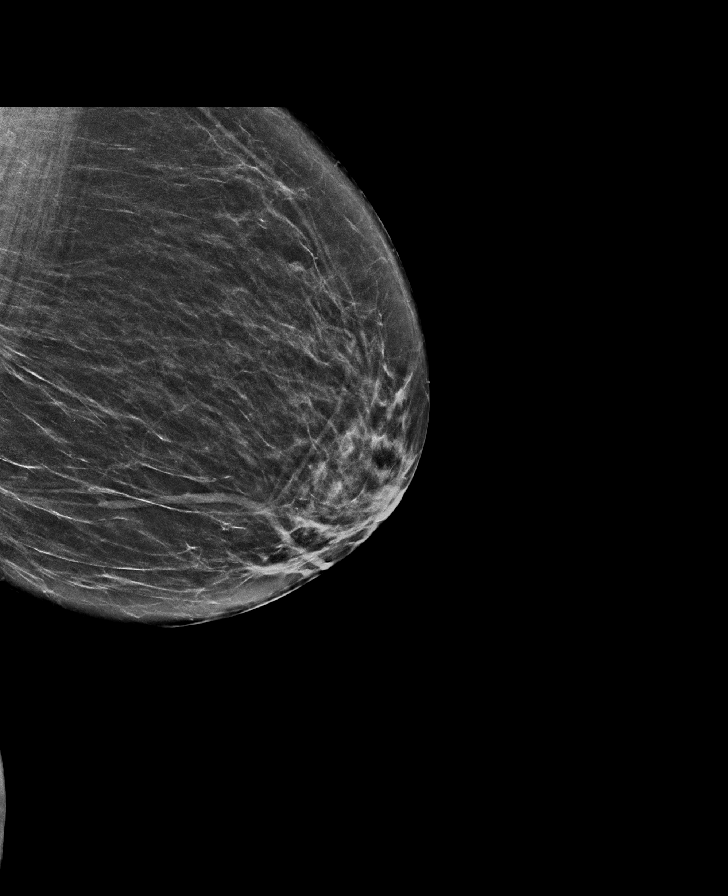

[L CC tomo · tomo slice 27/54.0]
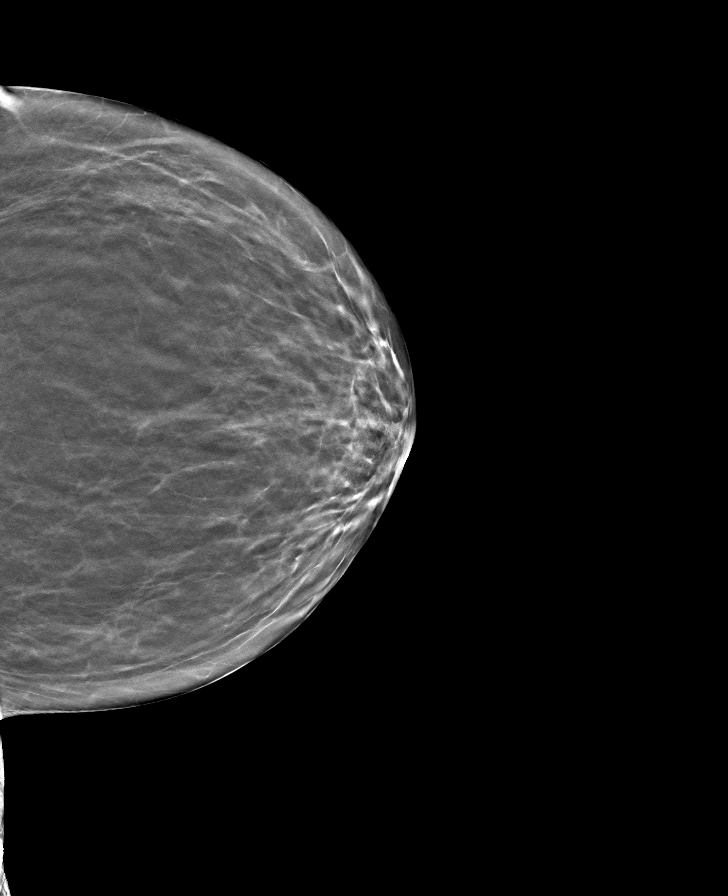

[R CC tomo · tomo slice 28/55.0]
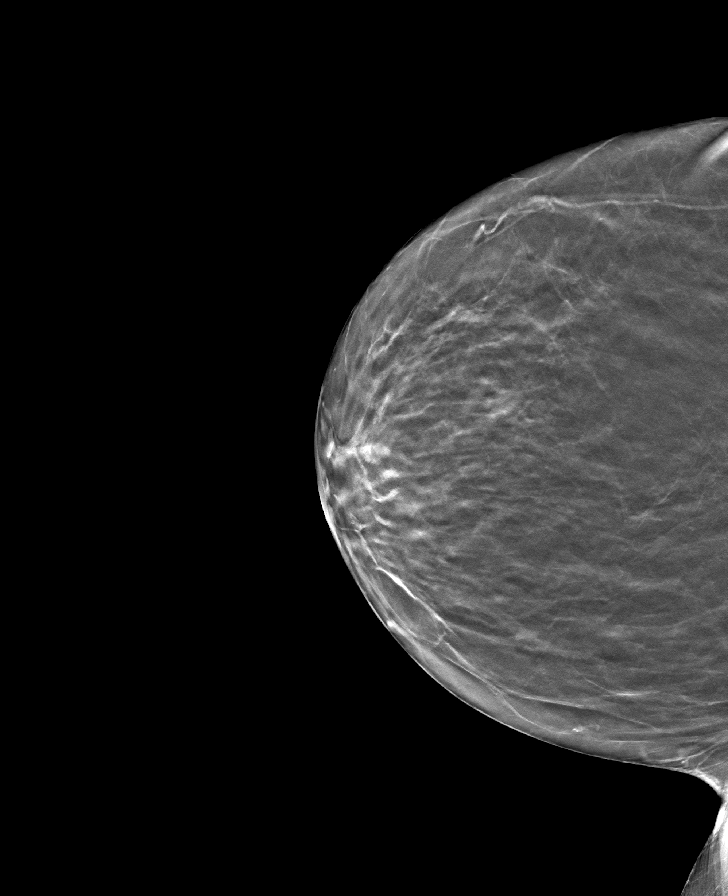

[R MLO tomo · tomo slice 29/56.0]
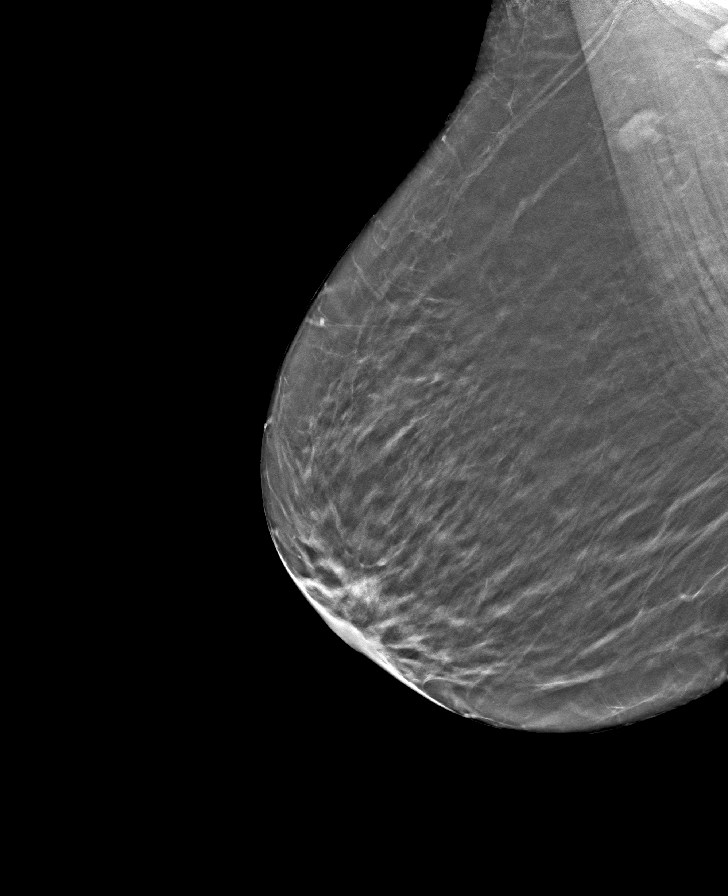

[L MLO tomo · tomo slice 31/61.0]
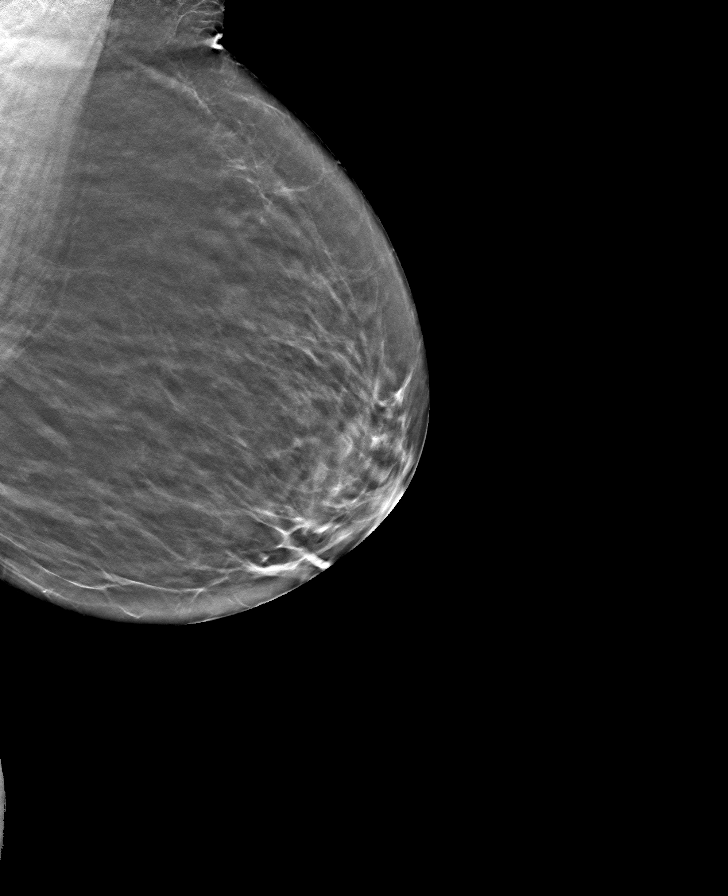

[8 of 24 positions shown; findings below may reference images not displayed]

ACR Breast Density Category b: There are scattered areas of
fibroglandular density.
FINDINGS: There are no findings suspicious for malignancy. Images were
processed with CAD.
IMPRESSION: No mammographic evidence of malignancy. A result letter of this
screening mammogram will be mailed directly to the patient.

RECOMMENDATION:
Screening mammogram in one year. (Code:CN-U-775)

BI-RADS CATEGORY  1: Negative.

## 2020-11-25 DIAGNOSIS — E063 Autoimmune thyroiditis: Secondary | ICD-10-CM | POA: Diagnosis not present

## 2020-11-25 DIAGNOSIS — E669 Obesity, unspecified: Secondary | ICD-10-CM | POA: Diagnosis not present

## 2020-11-25 DIAGNOSIS — G894 Chronic pain syndrome: Secondary | ICD-10-CM | POA: Diagnosis not present

## 2020-12-25 DIAGNOSIS — M1712 Unilateral primary osteoarthritis, left knee: Secondary | ICD-10-CM | POA: Diagnosis not present

## 2020-12-25 DIAGNOSIS — M3210 Systemic lupus erythematosus, organ or system involvement unspecified: Secondary | ICD-10-CM | POA: Diagnosis not present

## 2020-12-25 DIAGNOSIS — M1991 Primary osteoarthritis, unspecified site: Secondary | ICD-10-CM | POA: Diagnosis not present

## 2020-12-25 DIAGNOSIS — M1711 Unilateral primary osteoarthritis, right knee: Secondary | ICD-10-CM | POA: Diagnosis not present

## 2020-12-25 DIAGNOSIS — G894 Chronic pain syndrome: Secondary | ICD-10-CM | POA: Diagnosis not present

## 2020-12-25 DIAGNOSIS — Z6832 Body mass index (BMI) 32.0-32.9, adult: Secondary | ICD-10-CM | POA: Diagnosis not present

## 2021-01-22 DIAGNOSIS — I10 Essential (primary) hypertension: Secondary | ICD-10-CM | POA: Diagnosis not present

## 2021-01-22 DIAGNOSIS — G894 Chronic pain syndrome: Secondary | ICD-10-CM | POA: Diagnosis not present

## 2021-02-10 DIAGNOSIS — G894 Chronic pain syndrome: Secondary | ICD-10-CM | POA: Diagnosis not present

## 2021-02-10 DIAGNOSIS — M1991 Primary osteoarthritis, unspecified site: Secondary | ICD-10-CM | POA: Diagnosis not present

## 2021-02-10 DIAGNOSIS — I10 Essential (primary) hypertension: Secondary | ICD-10-CM | POA: Diagnosis not present

## 2021-03-24 DIAGNOSIS — H6122 Impacted cerumen, left ear: Secondary | ICD-10-CM | POA: Diagnosis not present

## 2021-03-24 DIAGNOSIS — G894 Chronic pain syndrome: Secondary | ICD-10-CM | POA: Diagnosis not present

## 2021-03-24 DIAGNOSIS — E6609 Other obesity due to excess calories: Secondary | ICD-10-CM | POA: Diagnosis not present

## 2021-03-24 DIAGNOSIS — Z6832 Body mass index (BMI) 32.0-32.9, adult: Secondary | ICD-10-CM | POA: Diagnosis not present

## 2021-04-16 DIAGNOSIS — G894 Chronic pain syndrome: Secondary | ICD-10-CM | POA: Diagnosis not present

## 2021-05-21 DIAGNOSIS — E063 Autoimmune thyroiditis: Secondary | ICD-10-CM | POA: Diagnosis not present

## 2021-05-21 DIAGNOSIS — G894 Chronic pain syndrome: Secondary | ICD-10-CM | POA: Diagnosis not present

## 2021-05-21 DIAGNOSIS — M159 Polyosteoarthritis, unspecified: Secondary | ICD-10-CM | POA: Diagnosis not present

## 2021-06-15 DIAGNOSIS — E063 Autoimmune thyroiditis: Secondary | ICD-10-CM | POA: Diagnosis not present

## 2021-06-15 DIAGNOSIS — M1991 Primary osteoarthritis, unspecified site: Secondary | ICD-10-CM | POA: Diagnosis not present

## 2021-06-15 DIAGNOSIS — M3210 Systemic lupus erythematosus, organ or system involvement unspecified: Secondary | ICD-10-CM | POA: Diagnosis not present

## 2021-06-15 DIAGNOSIS — I10 Essential (primary) hypertension: Secondary | ICD-10-CM | POA: Diagnosis not present

## 2021-07-15 DIAGNOSIS — I10 Essential (primary) hypertension: Secondary | ICD-10-CM | POA: Diagnosis not present

## 2021-07-15 DIAGNOSIS — E063 Autoimmune thyroiditis: Secondary | ICD-10-CM | POA: Diagnosis not present

## 2021-07-15 DIAGNOSIS — Z6833 Body mass index (BMI) 33.0-33.9, adult: Secondary | ICD-10-CM | POA: Diagnosis not present

## 2021-07-15 DIAGNOSIS — Z23 Encounter for immunization: Secondary | ICD-10-CM | POA: Diagnosis not present

## 2021-07-15 DIAGNOSIS — G894 Chronic pain syndrome: Secondary | ICD-10-CM | POA: Diagnosis not present

## 2021-07-15 DIAGNOSIS — M1991 Primary osteoarthritis, unspecified site: Secondary | ICD-10-CM | POA: Diagnosis not present

## 2021-07-15 DIAGNOSIS — H9202 Otalgia, left ear: Secondary | ICD-10-CM | POA: Diagnosis not present

## 2021-07-15 DIAGNOSIS — M3210 Systemic lupus erythematosus, organ or system involvement unspecified: Secondary | ICD-10-CM | POA: Diagnosis not present

## 2021-07-15 DIAGNOSIS — E6609 Other obesity due to excess calories: Secondary | ICD-10-CM | POA: Diagnosis not present

## 2021-08-13 DIAGNOSIS — M3210 Systemic lupus erythematosus, organ or system involvement unspecified: Secondary | ICD-10-CM | POA: Diagnosis not present

## 2021-08-13 DIAGNOSIS — G894 Chronic pain syndrome: Secondary | ICD-10-CM | POA: Diagnosis not present

## 2021-08-13 DIAGNOSIS — M1712 Unilateral primary osteoarthritis, left knee: Secondary | ICD-10-CM | POA: Diagnosis not present

## 2021-08-13 DIAGNOSIS — M1711 Unilateral primary osteoarthritis, right knee: Secondary | ICD-10-CM | POA: Diagnosis not present

## 2021-08-19 ENCOUNTER — Other Ambulatory Visit (HOSPITAL_COMMUNITY): Payer: Self-pay | Admitting: Internal Medicine

## 2021-08-19 DIAGNOSIS — Z1231 Encounter for screening mammogram for malignant neoplasm of breast: Secondary | ICD-10-CM

## 2021-08-26 ENCOUNTER — Ambulatory Visit (HOSPITAL_COMMUNITY): Payer: Medicare Other

## 2021-09-11 DIAGNOSIS — I10 Essential (primary) hypertension: Secondary | ICD-10-CM | POA: Diagnosis not present

## 2021-09-11 DIAGNOSIS — E063 Autoimmune thyroiditis: Secondary | ICD-10-CM | POA: Diagnosis not present

## 2021-09-11 DIAGNOSIS — M1991 Primary osteoarthritis, unspecified site: Secondary | ICD-10-CM | POA: Diagnosis not present

## 2021-09-11 DIAGNOSIS — G894 Chronic pain syndrome: Secondary | ICD-10-CM | POA: Diagnosis not present

## 2021-09-30 DIAGNOSIS — N3000 Acute cystitis without hematuria: Secondary | ICD-10-CM | POA: Diagnosis not present

## 2021-09-30 DIAGNOSIS — M3210 Systemic lupus erythematosus, organ or system involvement unspecified: Secondary | ICD-10-CM | POA: Diagnosis not present

## 2021-09-30 DIAGNOSIS — G894 Chronic pain syndrome: Secondary | ICD-10-CM | POA: Diagnosis not present

## 2021-09-30 DIAGNOSIS — Z6833 Body mass index (BMI) 33.0-33.9, adult: Secondary | ICD-10-CM | POA: Diagnosis not present

## 2021-09-30 DIAGNOSIS — E063 Autoimmune thyroiditis: Secondary | ICD-10-CM | POA: Diagnosis not present

## 2021-09-30 DIAGNOSIS — I10 Essential (primary) hypertension: Secondary | ICD-10-CM | POA: Diagnosis not present

## 2021-09-30 DIAGNOSIS — E6609 Other obesity due to excess calories: Secondary | ICD-10-CM | POA: Diagnosis not present

## 2021-10-08 DIAGNOSIS — H04123 Dry eye syndrome of bilateral lacrimal glands: Secondary | ICD-10-CM | POA: Diagnosis not present

## 2021-10-08 DIAGNOSIS — H5213 Myopia, bilateral: Secondary | ICD-10-CM | POA: Diagnosis not present

## 2021-10-08 DIAGNOSIS — H1820 Unspecified corneal edema: Secondary | ICD-10-CM | POA: Diagnosis not present

## 2021-10-08 DIAGNOSIS — Z961 Presence of intraocular lens: Secondary | ICD-10-CM | POA: Diagnosis not present

## 2021-10-29 DIAGNOSIS — H1820 Unspecified corneal edema: Secondary | ICD-10-CM | POA: Diagnosis not present

## 2021-10-29 DIAGNOSIS — H26493 Other secondary cataract, bilateral: Secondary | ICD-10-CM | POA: Diagnosis not present

## 2021-10-29 DIAGNOSIS — H04123 Dry eye syndrome of bilateral lacrimal glands: Secondary | ICD-10-CM | POA: Diagnosis not present

## 2021-10-30 DIAGNOSIS — G894 Chronic pain syndrome: Secondary | ICD-10-CM | POA: Diagnosis not present

## 2021-10-30 DIAGNOSIS — I10 Essential (primary) hypertension: Secondary | ICD-10-CM | POA: Diagnosis not present

## 2021-10-30 DIAGNOSIS — E063 Autoimmune thyroiditis: Secondary | ICD-10-CM | POA: Diagnosis not present

## 2021-11-05 DIAGNOSIS — H6123 Impacted cerumen, bilateral: Secondary | ICD-10-CM | POA: Diagnosis not present

## 2021-11-05 DIAGNOSIS — H903 Sensorineural hearing loss, bilateral: Secondary | ICD-10-CM | POA: Diagnosis not present

## 2021-11-05 DIAGNOSIS — H838X3 Other specified diseases of inner ear, bilateral: Secondary | ICD-10-CM | POA: Diagnosis not present

## 2021-11-30 DIAGNOSIS — M1991 Primary osteoarthritis, unspecified site: Secondary | ICD-10-CM | POA: Diagnosis not present

## 2021-11-30 DIAGNOSIS — I10 Essential (primary) hypertension: Secondary | ICD-10-CM | POA: Diagnosis not present

## 2021-11-30 DIAGNOSIS — E063 Autoimmune thyroiditis: Secondary | ICD-10-CM | POA: Diagnosis not present

## 2021-11-30 DIAGNOSIS — G894 Chronic pain syndrome: Secondary | ICD-10-CM | POA: Diagnosis not present

## 2021-12-03 ENCOUNTER — Other Ambulatory Visit (HOSPITAL_COMMUNITY): Payer: Self-pay | Admitting: Otolaryngology

## 2021-12-03 DIAGNOSIS — H7112 Cholesteatoma of tympanum, left ear: Secondary | ICD-10-CM | POA: Diagnosis not present

## 2021-12-03 DIAGNOSIS — H903 Sensorineural hearing loss, bilateral: Secondary | ICD-10-CM | POA: Diagnosis not present

## 2021-12-03 DIAGNOSIS — H7202 Central perforation of tympanic membrane, left ear: Secondary | ICD-10-CM | POA: Diagnosis not present

## 2021-12-23 DIAGNOSIS — Z6833 Body mass index (BMI) 33.0-33.9, adult: Secondary | ICD-10-CM | POA: Diagnosis not present

## 2021-12-23 DIAGNOSIS — E063 Autoimmune thyroiditis: Secondary | ICD-10-CM | POA: Diagnosis not present

## 2021-12-23 DIAGNOSIS — M3210 Systemic lupus erythematosus, organ or system involvement unspecified: Secondary | ICD-10-CM | POA: Diagnosis not present

## 2021-12-23 DIAGNOSIS — G894 Chronic pain syndrome: Secondary | ICD-10-CM | POA: Diagnosis not present

## 2021-12-23 DIAGNOSIS — E6609 Other obesity due to excess calories: Secondary | ICD-10-CM | POA: Diagnosis not present

## 2021-12-23 DIAGNOSIS — I10 Essential (primary) hypertension: Secondary | ICD-10-CM | POA: Diagnosis not present

## 2021-12-23 DIAGNOSIS — M1991 Primary osteoarthritis, unspecified site: Secondary | ICD-10-CM | POA: Diagnosis not present

## 2022-01-01 ENCOUNTER — Ambulatory Visit: Payer: Medicare Other | Admitting: Orthopedic Surgery

## 2022-01-11 ENCOUNTER — Ambulatory Visit (HOSPITAL_COMMUNITY): Payer: Medicare Other

## 2022-01-11 ENCOUNTER — Encounter (HOSPITAL_COMMUNITY): Payer: Self-pay

## 2022-02-04 DIAGNOSIS — G894 Chronic pain syndrome: Secondary | ICD-10-CM | POA: Diagnosis not present

## 2022-02-04 DIAGNOSIS — I10 Essential (primary) hypertension: Secondary | ICD-10-CM | POA: Diagnosis not present

## 2022-02-04 DIAGNOSIS — M1991 Primary osteoarthritis, unspecified site: Secondary | ICD-10-CM | POA: Diagnosis not present

## 2022-02-04 DIAGNOSIS — E063 Autoimmune thyroiditis: Secondary | ICD-10-CM | POA: Diagnosis not present

## 2022-03-02 DIAGNOSIS — K625 Hemorrhage of anus and rectum: Secondary | ICD-10-CM | POA: Diagnosis not present

## 2022-03-02 DIAGNOSIS — R109 Unspecified abdominal pain: Secondary | ICD-10-CM | POA: Diagnosis not present

## 2022-03-31 DIAGNOSIS — M3210 Systemic lupus erythematosus, organ or system involvement unspecified: Secondary | ICD-10-CM | POA: Diagnosis not present

## 2022-03-31 DIAGNOSIS — M1991 Primary osteoarthritis, unspecified site: Secondary | ICD-10-CM | POA: Diagnosis not present

## 2022-03-31 DIAGNOSIS — I700 Atherosclerosis of aorta: Secondary | ICD-10-CM | POA: Diagnosis not present

## 2022-03-31 DIAGNOSIS — G894 Chronic pain syndrome: Secondary | ICD-10-CM | POA: Diagnosis not present

## 2022-04-29 DIAGNOSIS — G894 Chronic pain syndrome: Secondary | ICD-10-CM | POA: Diagnosis not present

## 2022-04-29 DIAGNOSIS — I10 Essential (primary) hypertension: Secondary | ICD-10-CM | POA: Diagnosis not present

## 2022-04-29 DIAGNOSIS — M1991 Primary osteoarthritis, unspecified site: Secondary | ICD-10-CM | POA: Diagnosis not present

## 2022-04-29 DIAGNOSIS — M3210 Systemic lupus erythematosus, organ or system involvement unspecified: Secondary | ICD-10-CM | POA: Diagnosis not present

## 2022-05-27 DIAGNOSIS — E039 Hypothyroidism, unspecified: Secondary | ICD-10-CM | POA: Diagnosis not present

## 2022-05-27 DIAGNOSIS — E559 Vitamin D deficiency, unspecified: Secondary | ICD-10-CM | POA: Diagnosis not present

## 2022-05-27 DIAGNOSIS — Z0001 Encounter for general adult medical examination with abnormal findings: Secondary | ICD-10-CM | POA: Diagnosis not present

## 2022-05-27 DIAGNOSIS — D518 Other vitamin B12 deficiency anemias: Secondary | ICD-10-CM | POA: Diagnosis not present

## 2022-06-24 DIAGNOSIS — G894 Chronic pain syndrome: Secondary | ICD-10-CM | POA: Diagnosis not present

## 2022-06-24 DIAGNOSIS — F419 Anxiety disorder, unspecified: Secondary | ICD-10-CM | POA: Diagnosis not present

## 2022-06-24 DIAGNOSIS — M3210 Systemic lupus erythematosus, organ or system involvement unspecified: Secondary | ICD-10-CM | POA: Diagnosis not present

## 2022-06-24 DIAGNOSIS — I10 Essential (primary) hypertension: Secondary | ICD-10-CM | POA: Diagnosis not present

## 2022-07-19 DIAGNOSIS — G894 Chronic pain syndrome: Secondary | ICD-10-CM | POA: Diagnosis not present

## 2022-07-19 DIAGNOSIS — I10 Essential (primary) hypertension: Secondary | ICD-10-CM | POA: Diagnosis not present

## 2022-07-19 DIAGNOSIS — M1991 Primary osteoarthritis, unspecified site: Secondary | ICD-10-CM | POA: Diagnosis not present

## 2022-07-19 DIAGNOSIS — M3210 Systemic lupus erythematosus, organ or system involvement unspecified: Secondary | ICD-10-CM | POA: Diagnosis not present

## 2022-08-23 ENCOUNTER — Encounter: Payer: Self-pay | Admitting: Orthopedic Surgery

## 2023-01-13 DIAGNOSIS — H01112 Allergic dermatitis of right lower eyelid: Secondary | ICD-10-CM | POA: Diagnosis not present

## 2023-01-13 DIAGNOSIS — H01115 Allergic dermatitis of left lower eyelid: Secondary | ICD-10-CM | POA: Diagnosis not present

## 2023-01-13 DIAGNOSIS — H01114 Allergic dermatitis of left upper eyelid: Secondary | ICD-10-CM | POA: Diagnosis not present

## 2023-01-13 DIAGNOSIS — H1820 Unspecified corneal edema: Secondary | ICD-10-CM | POA: Diagnosis not present

## 2023-01-13 DIAGNOSIS — H01111 Allergic dermatitis of right upper eyelid: Secondary | ICD-10-CM | POA: Diagnosis not present

## 2023-01-13 DIAGNOSIS — H1789 Other corneal scars and opacities: Secondary | ICD-10-CM | POA: Diagnosis not present

## 2023-01-21 DIAGNOSIS — H01114 Allergic dermatitis of left upper eyelid: Secondary | ICD-10-CM | POA: Diagnosis not present

## 2023-01-21 DIAGNOSIS — H01115 Allergic dermatitis of left lower eyelid: Secondary | ICD-10-CM | POA: Diagnosis not present

## 2023-01-21 DIAGNOSIS — H10413 Chronic giant papillary conjunctivitis, bilateral: Secondary | ICD-10-CM | POA: Diagnosis not present

## 2023-01-21 DIAGNOSIS — H01111 Allergic dermatitis of right upper eyelid: Secondary | ICD-10-CM | POA: Diagnosis not present

## 2023-01-21 DIAGNOSIS — H1820 Unspecified corneal edema: Secondary | ICD-10-CM | POA: Diagnosis not present

## 2023-01-21 DIAGNOSIS — H01112 Allergic dermatitis of right lower eyelid: Secondary | ICD-10-CM | POA: Diagnosis not present

## 2023-05-12 ENCOUNTER — Other Ambulatory Visit (HOSPITAL_COMMUNITY): Payer: Self-pay | Admitting: Internal Medicine

## 2023-05-12 DIAGNOSIS — I739 Peripheral vascular disease, unspecified: Secondary | ICD-10-CM

## 2023-05-12 DIAGNOSIS — I700 Atherosclerosis of aorta: Secondary | ICD-10-CM

## 2023-05-20 ENCOUNTER — Ambulatory Visit (HOSPITAL_COMMUNITY)
Admission: RE | Admit: 2023-05-20 | Discharge: 2023-05-20 | Disposition: A | Discharge status: Home / Self Care | Payer: Medicare Other | Source: Ambulatory Visit | Attending: Internal Medicine | Admitting: Internal Medicine

## 2023-05-20 DIAGNOSIS — I739 Peripheral vascular disease, unspecified: Secondary | ICD-10-CM | POA: Insufficient documentation

## 2023-05-20 DIAGNOSIS — I700 Atherosclerosis of aorta: Secondary | ICD-10-CM | POA: Diagnosis not present

## 2023-09-27 DIAGNOSIS — Z0001 Encounter for general adult medical examination with abnormal findings: Secondary | ICD-10-CM | POA: Diagnosis not present

## 2023-09-27 DIAGNOSIS — R7309 Other abnormal glucose: Secondary | ICD-10-CM | POA: Diagnosis not present

## 2023-09-27 DIAGNOSIS — Z9229 Personal history of other drug therapy: Secondary | ICD-10-CM | POA: Diagnosis not present

## 2023-09-27 DIAGNOSIS — E782 Mixed hyperlipidemia: Secondary | ICD-10-CM | POA: Diagnosis not present

## 2023-09-27 DIAGNOSIS — E559 Vitamin D deficiency, unspecified: Secondary | ICD-10-CM | POA: Diagnosis not present

## 2023-09-27 DIAGNOSIS — M3210 Systemic lupus erythematosus, organ or system involvement unspecified: Secondary | ICD-10-CM | POA: Diagnosis not present

## 2023-10-17 DIAGNOSIS — R5383 Other fatigue: Secondary | ICD-10-CM | POA: Diagnosis not present

## 2023-12-21 DIAGNOSIS — F419 Anxiety disorder, unspecified: Secondary | ICD-10-CM | POA: Diagnosis not present

## 2023-12-21 DIAGNOSIS — M3210 Systemic lupus erythematosus, organ or system involvement unspecified: Secondary | ICD-10-CM | POA: Diagnosis not present

## 2023-12-21 DIAGNOSIS — I10 Essential (primary) hypertension: Secondary | ICD-10-CM | POA: Diagnosis not present

## 2023-12-21 DIAGNOSIS — G894 Chronic pain syndrome: Secondary | ICD-10-CM | POA: Diagnosis not present

## 2023-12-21 DIAGNOSIS — M1991 Primary osteoarthritis, unspecified site: Secondary | ICD-10-CM | POA: Diagnosis not present

## 2024-01-19 ENCOUNTER — Other Ambulatory Visit (HOSPITAL_COMMUNITY): Payer: Self-pay | Admitting: Internal Medicine

## 2024-01-19 DIAGNOSIS — M79606 Pain in leg, unspecified: Secondary | ICD-10-CM

## 2024-01-19 DIAGNOSIS — Z136 Encounter for screening for cardiovascular disorders: Secondary | ICD-10-CM

## 2024-01-26 ENCOUNTER — Ambulatory Visit (HOSPITAL_COMMUNITY)
Admission: RE | Admit: 2024-01-26 | Discharge: 2024-01-26 | Disposition: A | Discharge status: Home / Self Care | Source: Ambulatory Visit | Attending: Internal Medicine | Admitting: Internal Medicine

## 2024-01-26 DIAGNOSIS — M79605 Pain in left leg: Secondary | ICD-10-CM | POA: Diagnosis not present

## 2024-01-26 DIAGNOSIS — Z136 Encounter for screening for cardiovascular disorders: Secondary | ICD-10-CM | POA: Insufficient documentation

## 2024-01-26 DIAGNOSIS — M79606 Pain in leg, unspecified: Secondary | ICD-10-CM | POA: Insufficient documentation

## 2024-01-26 DIAGNOSIS — I10 Essential (primary) hypertension: Secondary | ICD-10-CM | POA: Diagnosis not present

## 2024-01-26 DIAGNOSIS — M79604 Pain in right leg: Secondary | ICD-10-CM | POA: Diagnosis not present

## 2024-01-26 DIAGNOSIS — E785 Hyperlipidemia, unspecified: Secondary | ICD-10-CM | POA: Diagnosis not present

## 2024-03-14 DIAGNOSIS — M5412 Radiculopathy, cervical region: Secondary | ICD-10-CM | POA: Diagnosis not present

## 2024-03-14 DIAGNOSIS — M3210 Systemic lupus erythematosus, organ or system involvement unspecified: Secondary | ICD-10-CM | POA: Diagnosis not present

## 2024-03-14 DIAGNOSIS — M1991 Primary osteoarthritis, unspecified site: Secondary | ICD-10-CM | POA: Diagnosis not present

## 2024-03-14 DIAGNOSIS — I10 Essential (primary) hypertension: Secondary | ICD-10-CM | POA: Diagnosis not present

## 2024-03-14 DIAGNOSIS — G894 Chronic pain syndrome: Secondary | ICD-10-CM | POA: Diagnosis not present

## 2024-05-23 ENCOUNTER — Ambulatory Visit: Payer: Self-pay

## 2024-05-23 VITALS — BP 175/69 | HR 76 | Ht 60.0 in | Wt 156.0 lb

## 2024-05-23 DIAGNOSIS — M797 Fibromyalgia: Secondary | ICD-10-CM | POA: Diagnosis not present

## 2024-05-23 DIAGNOSIS — I10 Essential (primary) hypertension: Secondary | ICD-10-CM | POA: Diagnosis not present

## 2024-05-23 DIAGNOSIS — M170 Bilateral primary osteoarthritis of knee: Secondary | ICD-10-CM

## 2024-05-23 DIAGNOSIS — G894 Chronic pain syndrome: Secondary | ICD-10-CM

## 2024-05-23 DIAGNOSIS — M329 Systemic lupus erythematosus, unspecified: Secondary | ICD-10-CM | POA: Insufficient documentation

## 2024-05-23 MED ORDER — PREGABALIN 100 MG PO CAPS: 100.0000 mg | ORAL_CAPSULE | Freq: Three times a day (TID) | ORAL | 2 refills | Status: AC

## 2024-05-23 MED ORDER — HYDROMORPHONE HCL 4 MG PO TABS: 8.0000 mg | ORAL_TABLET | Freq: Four times a day (QID) | ORAL | 0 refills | Status: DC | PRN

## 2024-05-23 NOTE — Progress Notes (Signed)
 New Patient Office Visit  Subjective    Patient ID: [name], female    DOB: 1946-03-11  Age: 78 y.o. MRN: [id number]  CC:  Chief Complaint  Patient presents with   Establish Care    HPI [name] presents to establish care Discussed the use of AI scribe software for clinical note transcription with the patient, who gave verbal consent to proceed.  History of Present Illness   [name] is a 78 year old female with lupus, chronic pain, and fibromyalgia who presents for medication refills.  Chronic pain and musculoskeletal symptoms - Chronic pain associated with lupus and fibromyalgia - Difficulty ambulating due to knee discomfort - Knee instability described as the knee moving with the bone and twisting when lying down, resulting in discomfort - Fingers exhibit deformity and are not straight, attributed to underlying condition  Medication management - Requests refills for hydromorphone  (as needed), pregabalin  (three times daily), and nystatin powder - Has been receiving 90 tablets of hydromorphone  monthly for the past three months  Supportive measures and symptom management - Wears compression socks for symptom relief, but some are uncomfortably tight  General well-being - Occasional episodes of feeling unwell, attributed to chronic conditions - No new symptoms or changes since last visit       Outpatient Encounter Medications as of 05/23/2024  Medication Sig   bisoprolol (ZEBETA) 5 MG tablet Take 5 mg by mouth daily.    clotrimazole-betamethasone (LOTRISONE) cream Apply 1 application topically 2 (two) times daily as needed (for irritation).    levothyroxine (SYNTHROID, LEVOTHROID) 75 MCG tablet Take 75 mcg by mouth daily before breakfast.    methocarbamol  (ROBAXIN ) 500 MG tablet Take 1 tablet (500 mg total) by mouth every 8 (eight) hours as needed for muscle spasms (body aches).   metoCLOPramide (REGLAN) 5 MG tablet Take 5 mg by mouth 4 (four) times daily.    Multiple Vitamins-Minerals (WOMENS MULTIVITAMIN PO) Take 1 tablet by mouth daily.   mupirocin ointment (BACTROBAN) 2 % Apply 1 application topically 3 (three) times daily as needed (for burn).   nystatin cream (MYCOSTATIN) Apply topically 3 (three) times daily.   simvastatin (ZOCOR) 40 MG tablet Take 40 mg by mouth at bedtime.    [DISCONTINUED] HYDROmorphone  (DILAUDID ) 8 MG tablet Take 1-2 tablets (8-16 mg total) by mouth every 4 (four) hours as needed. pain   [DISCONTINUED] pregabalin  (LYRICA ) 100 MG capsule Take 100 mg by mouth 3 (three) times daily.   HYDROmorphone  (DILAUDID ) 4 MG tablet Take 2 tablets (8 mg total) by mouth every 6 (six) hours as needed for severe pain (pain score 7-10). pain   neomycin -polymyxin b-dexamethasone  (MAXITROL ) 3.5-10000-0.1 SUSP Place 2 drops into the right eye every 6 (six) hours.    ondansetron  (ZOFRAN ) 4 MG tablet Take 1 tablet (4 mg total) by mouth every 8 (eight) hours as needed for nausea or vomiting.   pregabalin  (LYRICA ) 100 MG capsule Take 1 capsule (100 mg total) by mouth 3 (three) times daily.   No facility-administered encounter medications on file as of 05/23/2024.    Past Medical History:  Diagnosis Date   Diabetes mellitus    Fibromyalgia    GERD (gastroesophageal reflux disease)    Lupus    Other and unspecified hyperlipidemia 12/16/2010   Other chronic pain 12/16/2010   Thyroid  disease    Unspecified essential hypertension 12/16/2010    Past Surgical History:  Procedure Laterality Date   ABDOMINAL HYSTERECTOMY  30 yrs ago  CATARACT EXTRACTION W/PHACO  09/20/2011   Procedure: CATARACT EXTRACTION PHACO AND INTRAOCULAR LENS PLACEMENT (IOC);  Surgeon: Cherene Mania, MD;  Location: [facility];  Service: Ophthalmology;  Laterality: Left;  CDE:9:14   CATARACT EXTRACTION W/PHACO  10/04/2011   Procedure: CATARACT EXTRACTION PHACO AND INTRAOCULAR LENS PLACEMENT (IOC);  Surgeon: Cherene Mania, MD;  Location: [facility];  Service: Ophthalmology;  Laterality:  Right;  CDE:11.66   CHOLECYSTECTOMY     COLONOSCOPY WITH PROPOFOL  N/A 07/21/2018   Procedure: COLONOSCOPY WITH PROPOFOL ;  Surgeon: Golda Claudis PENNER, MD;  Location: [facility];  Service: Endoscopy;  Laterality: N/A;  10:55   SPINAL FIXATION SURGERY      Family History  Problem Relation Age of Onset   Anesthesia problems Neg Hx    Hypotension Neg Hx    Malignant hyperthermia Neg Hx    Pseudochol deficiency Neg Hx     Social History   Socioeconomic History   Marital status: Single    Spouse name: Not on file   Number of children: Not on file   Years of education: Not on file   Highest education level: Not on file  Occupational History   Not on file  Tobacco Use   Smoking status: Former    Current packs/day: 0.00    Average packs/day: 0.3 packs/day for 1 year (0.3 ttl pk-yrs)    Types: Cigarettes    Start date: 02/02/1970    Quit date: 02/03/1971    Years since quitting: 53.3   Smokeless tobacco: Former    Quit date: 09/14/1964  Substance and Sexual Activity   Alcohol use: No   Drug use: No   Sexual activity: Not on file  Other Topics Concern   Not on file  Social History Narrative   Not on file   Social Drivers of Health   Financial Resource Strain: Not on file  Food Insecurity: Not on file  Transportation Needs: Not on file  Physical Activity: Not on file  Stress: Not on file  Social Connections: Not on file  Intimate Partner Violence: Not on file   ROS     Objective    BP (!) 175/69   Pulse 76   Ht 5' (1.524 m)   Wt 156 lb (70.8 kg)   SpO2 94%   BMI 30.47 kg/m   Physical Exam Vitals and nursing note reviewed.  Constitutional:      Appearance: Normal appearance.  HENT:     Head: Normocephalic.  Eyes:     Extraocular Movements: Extraocular movements intact.     Pupils: Pupils are equal, round, and reactive to light.  Cardiovascular:     Rate and Rhythm: Normal rate and regular rhythm.  Pulmonary:     Effort: Pulmonary effort is normal.      Breath sounds: Normal breath sounds.  Musculoskeletal:     Cervical back: Normal range of motion and neck supple.     Right knee: Swelling and bony tenderness present. Decreased range of motion. Abnormal alignment.     Left knee: Swelling and bony tenderness present. Decreased range of motion.  Neurological:     Mental Status: She is alert and oriented to person, place, and time.     Gait: Gait abnormal (antalgic gait).  Psychiatric:        Mood and Affect: Mood normal.        Thought Content: Thought content normal.         Assessment & Plan:   Problem List Items Addressed  This Visit       Cardiovascular and Mediastinum   Hypertension (Chronic)   Elevated today, but she reports increase in pain since being out of pain medication.   She takes bisoprolol 5 mg for this.  No medication changes made today.   Continue with current dose.  No refills needed at this time.   Recommend low-sodium diet.          Musculoskeletal and Integument   OA (osteoarthritis) of knee   Chronic pain currently treated with hydromorphone  long term.   PDMP reviewed.  Agree to refill for prn use.        Relevant Medications   HYDROmorphone  (DILAUDID ) 4 MG tablet     Other   Chronic pain syndrome (Chronic)   Chronic pain related to fibromyalgia, systemic lupus and OA of knee, currently treated with hydromorphone  and Lyrica .     PDMP reviewed.  Agree to refill as previously prescribed.       Relevant Medications   HYDROmorphone  (DILAUDID ) 4 MG tablet   pregabalin  (LYRICA ) 100 MG capsule   Fibromyalgia - Primary   Chronic pain related to fibromyalgia currently treated with hydromorphone  and Lyrica .     PDMP reviewed.  Agree to refill as previously prescribed.           Relevant Medications   HYDROmorphone  (DILAUDID ) 4 MG tablet   pregabalin  (LYRICA ) 100 MG capsule      Return in about 3 months (around 08/22/2024) for chronic follow-up with PCP.   Leita Longs, FNP

## 2024-05-25 ENCOUNTER — Other Ambulatory Visit (HOSPITAL_COMMUNITY): Payer: Self-pay

## 2024-05-27 NOTE — Assessment & Plan Note (Signed)
 Elevated today, but she reports increase in pain since being out of pain medication.   She takes bisoprolol 5 mg for this.  No medication changes made today.   Continue with current dose.  No refills needed at this time.   Recommend low-sodium diet.

## 2024-05-27 NOTE — Assessment & Plan Note (Signed)
 Chronic pain currently treated with hydromorphone  long term.   PDMP reviewed.  Agree to refill for prn use.

## 2024-05-27 NOTE — Assessment & Plan Note (Signed)
 Chronic pain related to fibromyalgia, systemic lupus and OA of knee, currently treated with hydromorphone  and Lyrica .     PDMP reviewed.  Agree to refill as previously prescribed.

## 2024-05-27 NOTE — Assessment & Plan Note (Signed)
 Chronic pain related to fibromyalgia currently treated with hydromorphone  and Lyrica .     PDMP reviewed.  Agree to refill as previously prescribed.

## 2024-05-28 ENCOUNTER — Telehealth: Payer: Self-pay | Admitting: Pharmacy Technician

## 2024-05-28 NOTE — Telephone Encounter (Signed)
 Pharmacy Patient Advocate Encounter   Received notification from CoverMyMeds that prior authorization for HYDROmorphone  HCl 4MG  tablets is required/requested.   Insurance verification completed.   The patient is insured through [facility] MEDICARE .   Per test claim: PA required; PA submitted to above mentioned insurance via Latent Key/confirmation #/EOC [id number] Status is pending

## 2024-05-29 ENCOUNTER — Telehealth: Payer: Self-pay

## 2024-05-29 NOTE — Telephone Encounter (Signed)
 Copied from CRM #[id number]. Topic: Clinical - Prescription Issue >> May 29, 2024 12:54 PM Tobias CROME wrote: Reason for CRM: Patient states she was charged $60 for the hydromorphone  prescription. Patient states she picked up the prescription on Friday and had to pay $60.   Patient informed it was due to insurance. Patient requesting assistance with this,   Best callback number: [phone]

## 2024-05-29 NOTE — Telephone Encounter (Signed)
 Copied from CRM #[id number]. Topic: Clinical - Prescription Issue >> May 29, 2024 12:54 PM Tobias CROME wrote: Reason for CRM: Patient states she was charged $60 for the hydromorphone  prescription. Patient states she picked up the prescription on Friday and had to pay $60.   Patient informed it was due to insurance. Patient requesting assistance with this,   Best callback number: [phone] >> May 29, 2024  3:05 PM Donna BRAVO wrote: Patient called the call kept cutting in and out. Calling patient back. Patient asking about medication refill for HYDROmorphone  (DILAUDID ) 4 MG tablet  patient would like to speak with someone about insurance and refill. Patient is worried about the cost of medication and insurance.    Gladis Rock DEL, CPhT    05/29/24  2:43 PM Note Pharmacy Patient Advocate Encounter   Received notification from [facility] MEDICARE that Prior Authorization for HYDROmorphone  HCl 4MG  tablets has been DENIED.  Full denial letter will be uploaded to the media tab. See denial reason below.    PA #/Case ID/Reference #: [id number]

## 2024-05-29 NOTE — Telephone Encounter (Signed)
 Pharmacy Patient Advocate Encounter  Received notification from [facility] MEDICARE that Prior Authorization for HYDROmorphone  HCl 4MG  tablets has been DENIED.  Full denial letter will be uploaded to the media tab. See denial reason below.   PA #/Case ID/Reference #: [id number]

## 2024-05-30 ENCOUNTER — Other Ambulatory Visit: Payer: Self-pay

## 2024-05-30 DIAGNOSIS — G894 Chronic pain syndrome: Secondary | ICD-10-CM

## 2024-05-30 MED ORDER — HYDROMORPHONE HCL 8 MG PO TABS: 8.0000 mg | ORAL_TABLET | Freq: Four times a day (QID) | ORAL | 0 refills | Status: DC | PRN

## 2024-05-30 NOTE — Telephone Encounter (Signed)
 Constant ring, no answer, no vm

## 2024-06-12 ENCOUNTER — Telehealth: Payer: Self-pay

## 2024-06-12 NOTE — Telephone Encounter (Signed)
 Copied from CRM [id number]. Topic: Clinical - Order For Equipment >> Jun 12, 2024  1:18 PM Montie POUR wrote: Reason for CRM:  Tawny needs for FNP Leita Longs to write an new order for her Incontinence supplies. She lost the telephone number where she gets her supplies and she does not know the name of the company. She is going to look for the number and call us  back. She also needs to look at one of the boxes from the supply company to see if she can find out the name of the company. Thanks >> Jun 12, 2024  1:33 PM Gustabo BIRCH wrote: [phone]- pt called to give the number for her supplies

## 2024-06-13 ENCOUNTER — Other Ambulatory Visit: Payer: Self-pay

## 2024-06-13 DIAGNOSIS — G894 Chronic pain syndrome: Secondary | ICD-10-CM

## 2024-06-13 MED ORDER — METOCLOPRAMIDE HCL 5 MG PO TABS: 5.0000 mg | ORAL_TABLET | Freq: Four times a day (QID) | ORAL | 0 refills | Status: DC

## 2024-06-13 MED ORDER — HYDROMORPHONE HCL 8 MG PO TABS: 8.0000 mg | ORAL_TABLET | Freq: Four times a day (QID) | ORAL | 0 refills | Status: DC | PRN

## 2024-06-13 NOTE — Telephone Encounter (Signed)
 Copied from CRM [id number]. Topic: Clinical - Medication Refill >> Jun 13, 2024 10:24 AM Nathanel BROCKS wrote: Medication: HYDROmorphone  (DILAUDID ) 8 MG tablet (pharmacy stated that ins will not pay for 8 mg only 4 mg) can you change the way its written to cover for ins. They run out on the 19th, that she has left.

## 2024-06-13 NOTE — Telephone Encounter (Signed)
 Copied from CRM [id number]. Topic: Clinical - Medication Refill >> Jun 13, 2024 10:24 AM Nathanel BROCKS wrote: Medication: HYDROmorphone  (DILAUDID ) 8 MG tablet (pharmacy stated that ins will not pay for 8 mg only 4 mg) can you change the way its written to cover for ins. They run out on the 19th, that she has left.   metoCLOPramide (REGLAN) 5 MG tablet   Has the patient contacted their pharmacy? Yes   This is the patient's preferred pharmacy:  [facility] - [place], [place] - [address] [place] [address] Phone: [phone] Fax: [phone]  Is this the correct pharmacy for this prescription? Yes If no, delete pharmacy and type the correct one.   Has the prescription been filled recently? Yes  Is the patient out of the medication? Yes  Has the patient been seen for an appointment in the last year OR does the patient have an upcoming appointment? Yes  Can we respond through MyChart? No  Agent: Please be advised that Rx refills may take up to 3 business days. We ask that you follow-up with your pharmacy.

## 2024-06-20 ENCOUNTER — Other Ambulatory Visit: Payer: Self-pay

## 2024-06-20 ENCOUNTER — Telehealth: Payer: Self-pay

## 2024-06-20 DIAGNOSIS — G894 Chronic pain syndrome: Secondary | ICD-10-CM

## 2024-06-20 NOTE — Telephone Encounter (Signed)
 Copied from CRM [id number]. Topic: Clinical - Prescription Issue >> Jun 20, 2024  3:10 PM Everette C wrote: Reason for CRM: The patient has been directed by their pharmacy to contact their PCP and request a new prescription for their HYDROmorphone    The patient has been told that their insurance company will not cover a prescription for 8 MG and has been directed to request a doubled prescription of 4 MG that also includes directions to   Take 2 tablets by mouth 4 times daily as needed for pain   Please contact the patient further if needed >> Jun 20, 2024  3:31 PM Donna E wrote: Patient calling back in making sure information is correct on prescription.  Prescription need to say HYDROmorphone   4 MG       and includes theses written directions    Take 2 tablets by mouth 4 times daily as needed for pain

## 2024-06-21 ENCOUNTER — Other Ambulatory Visit: Payer: Self-pay

## 2024-06-21 ENCOUNTER — Telehealth: Payer: Self-pay

## 2024-06-21 DIAGNOSIS — G894 Chronic pain syndrome: Secondary | ICD-10-CM

## 2024-06-21 MED ORDER — HYDROMORPHONE HCL 4 MG PO TABS: 8.0000 mg | ORAL_TABLET | ORAL | 0 refills | Status: DC | PRN

## 2024-06-21 NOTE — Telephone Encounter (Signed)
 Patient advised.

## 2024-06-21 NOTE — Telephone Encounter (Signed)
 Refill sent to pharmacy.

## 2024-06-21 NOTE — Telephone Encounter (Signed)
 Copied from CRM [id number]. Topic: General - Other >> Jun 21, 2024 12:14 PM Joesph NOVAK wrote: Reason for CRM: Patient needs documentation signed by provider to receive incontinence supplies. She n  [phone], Sherlean is the incontinence care specialist. Patient states this paperwork has been faxed to [employer].

## 2024-06-21 NOTE — Telephone Encounter (Unsigned)
 Copied from CRM [id number]. Topic: Clinical - Medication Question >> Jun 21, 2024 12:24 PM Tiffini S wrote: Reason for CRM: Penne with [facility] called with questions about HYDROmorphone  (DILAUDID ) 4 MG tablet- asked to talk directly with pcp or clinical nurse   Called CAL, no answer. He have questions about the prescribing directions and dosage. Please call [phone] to follow up with the pharmacy.

## 2024-06-22 NOTE — Telephone Encounter (Signed)
 I have filled out papers for her incontinence supplies at least 3 times and they have been faxed in.  Please be sure these are being scanned into patient's chart when faxed thank you

## 2024-06-22 NOTE — Telephone Encounter (Signed)
 Called [facility] and got the were closed

## 2024-06-22 NOTE — Telephone Encounter (Signed)
 Papers was scanned in to pts chart, printed off will fax back

## 2024-06-29 ENCOUNTER — Telehealth: Payer: Self-pay

## 2024-06-29 NOTE — Telephone Encounter (Signed)
 Copied from CRM [id number]. Topic: Clinical - Prescription Issue >> Jun 29, 2024 10:52 AM Travis F wrote: Reason for CRM: Hoy with [facility] is calling in requesting to speak with a nurse regarding an issue with HYDROmorphone  (DILAUDID ) 4 MG tablet [[id number]]. She says it is too complex to put in a message.

## 2024-07-03 ENCOUNTER — Telehealth: Payer: Self-pay

## 2024-07-03 ENCOUNTER — Other Ambulatory Visit: Payer: Self-pay

## 2024-07-03 DIAGNOSIS — G894 Chronic pain syndrome: Secondary | ICD-10-CM

## 2024-07-03 MED ORDER — HYDROMORPHONE HCL 8 MG PO TABS: 8.0000 mg | ORAL_TABLET | Freq: Four times a day (QID) | ORAL | 0 refills | Status: DC | PRN

## 2024-07-03 NOTE — Telephone Encounter (Signed)
 Hydromorphone  is limited 180 tablets a month Rx needs to be 8 mg's every 6 hrs as needed for severe pain 7-10 same rx as the one she got before this last 120 tablets 30 days supply, if we can get that corrected she wont have anymore issues getting her rxs

## 2024-07-03 NOTE — Telephone Encounter (Signed)
 Noted. Talked to [facility] on previous encounter

## 2024-07-03 NOTE — Telephone Encounter (Signed)
Prescription changed as requested

## 2024-07-03 NOTE — Telephone Encounter (Signed)
 Copied from CRM [id number]. Topic: Clinical - Medication Question >> Jul 03, 2024  9:38 AM Tonda B wrote: Reason for CRM: [facility] calling have questions about rxHYDROmorphone (DILAUDID ) 4 MG tablet please call back   [phone]

## 2024-07-06 ENCOUNTER — Ambulatory Visit (INDEPENDENT_AMBULATORY_CARE_PROVIDER_SITE_OTHER): Payer: Self-pay

## 2024-07-06 VITALS — BP 170/90 | HR 77 | Ht 60.0 in | Wt 158.1 lb

## 2024-07-06 DIAGNOSIS — B351 Tinea unguium: Secondary | ICD-10-CM | POA: Diagnosis not present

## 2024-07-06 DIAGNOSIS — L89151 Pressure ulcer of sacral region, stage 1: Secondary | ICD-10-CM | POA: Diagnosis not present

## 2024-07-06 DIAGNOSIS — H10402 Unspecified chronic conjunctivitis, left eye: Secondary | ICD-10-CM | POA: Diagnosis not present

## 2024-07-06 DIAGNOSIS — L089 Local infection of the skin and subcutaneous tissue, unspecified: Secondary | ICD-10-CM

## 2024-07-06 MED ORDER — NEOMYCIN-POLYMYXIN-DEXAMETH 3.5-10000-0.1 OP SUSP: 2.0000 [drp] | Freq: Four times a day (QID) | OPHTHALMIC | 2 refills | Status: AC

## 2024-07-06 MED ORDER — SANTYL 250 UNIT/GM EX OINT: 1.0000 | TOPICAL_OINTMENT | Freq: Every day | CUTANEOUS | 5 refills | Status: DC

## 2024-07-06 MED ORDER — NYSTATIN-TRIAMCINOLONE 100000-0.1 UNIT/GM-% EX OINT: 1.0000 | TOPICAL_OINTMENT | Freq: Two times a day (BID) | CUTANEOUS | 2 refills | Status: DC

## 2024-07-06 MED ORDER — BACITRACIN 500 UNIT/GM EX OINT: 1.0000 | TOPICAL_OINTMENT | Freq: Two times a day (BID) | CUTANEOUS | 2 refills | Status: AC

## 2024-07-06 NOTE — Progress Notes (Signed)
 Established Patient Office Visit  Subjective   Patient ID: [name], female    DOB: 02-22-1946  Age: 78 y.o. MRN: [id number]  Chief Complaint  Patient presents with   Medical Management of Chronic Issues    Hard Crusty, scaly/flaky stuff in the corner of eyes.     HPI  Discussed the use of AI scribe software for clinical note transcription with the patient, who gave verbal consent to proceed.  History of Present Illness    [name] is a 78 year old female who presents with a rash and crusting around her eyes.  Periorbital dermatitis and ocular crusting - Rash and crusting around the eyes since August - Thick, hard crusting in the corners of the eyes causing redness and rawness - Attempts to remove crusting by scraping, use of warm washcloth and grease for softening, but crusting remains painful - Intermittent use of a new eye drop since August, prescribed in two boxes  Intertriginous dermatitis - Rash under the breast described as crusty  Sacrococcygeal ulceration - Sore on the buttocks near the tailbone, almost healed - New sore developing nearby - Desires complete resolution of the area  Nasal mucosal lesions and rhinorrhea - Significant mucus under the nose - Sores inside the nose, previously treated with peroxide on toilet paper resulting in resolution - Ongoing throat pain     Patient Active Problem List   Diagnosis Date Noted   Pressure injury of sacral region, stage 1 07/15/2024   Chronic bacterial conjunctivitis of left eye 07/15/2024   Tinea unguium 07/15/2024   Systemic lupus erythematosus (HCC) 05/23/2024   Hypertension 05/23/2024   Chronic pain syndrome 05/23/2024   Special screening for malignant neoplasms, colon 06/12/2018   Fibromyalgia 01/05/2016   Hypothyroidism 01/05/2016   Rectal bleeding 01/05/2016   Diarrhea 01/05/2016   OA (osteoarthritis) of knee 09/30/2010    ROS    Objective:     BP (!) 170/90   Pulse 77   Ht 5' (1.524  m)   Wt 158 lb 1.3 oz (71.7 kg)   SpO2 93%   BMI 30.87 kg/m  BP Readings from Last 3 Encounters:  07/06/24 (!) 170/90  05/23/24 (!) 175/69  08/05/20 (!) 145/76   Wt Readings from Last 3 Encounters:  07/06/24 158 lb 1.3 oz (71.7 kg)  05/23/24 156 lb (70.8 kg)  08/04/20 159 lb (72.1 kg)      Physical Exam Vitals and nursing note reviewed.  Constitutional:      Appearance: Normal appearance.  HENT:     Head: Normocephalic.  Eyes:     General:        Left eye: Discharge (crusty) present.    Extraocular Movements: Extraocular movements intact.     Conjunctiva/sclera:     Right eye: Right conjunctiva is not injected. No exudate.    Left eye: Left conjunctiva is injected. Exudate present.     Pupils: Pupils are equal, round, and reactive to light.  Cardiovascular:     Rate and Rhythm: Normal rate and regular rhythm.  Pulmonary:     Effort: Pulmonary effort is normal.     Breath sounds: Normal breath sounds.  Musculoskeletal:     Cervical back: Normal range of motion and neck supple.       Back:  Neurological:     Mental Status: She is alert and oriented to person, place, and time.  Psychiatric:        Mood and Affect: Mood normal.  Thought Content: Thought content normal.    No results found for any visits on 07/06/24.    The ASCVD Risk score (Arnett DK, et al., 2019) failed to calculate for the following reasons:   Cannot find a previous HDL lab   Cannot find a previous total cholesterol lab    Assessment & Plan:   Problem List Items Addressed This Visit       Musculoskeletal and Integument   Tinea unguium   ntertriginous dermatitis under breast and groin, requires topical treatment. - Prescribed topical treatment for rash under breast and groin.      Relevant Medications   nystatin-triamcinolone ointment (MYCOLOG)   bacitracin 500 UNIT/GM ointment     Other   Pressure injury of sacral region, stage 1 - Primary   Stage 1 pressure ulcer on  sacral region, nearly healed. - Prescribed topical treatment for sacral sore.      Relevant Medications   collagenase (SANTYL) 250 UNIT/GM ointment   Chronic bacterial conjunctivitis of left eye   Chronic conjunctivitis in left eye with crusting and discomfort, requires ongoing management. - Prescribed additional eye drops. - Advised use of warm washcloth to soften crusting.       Relevant Medications   neomycin -polymyxin b-dexamethasone  (MAXITROL ) 3.5-10000-0.1 SUSP   nystatin-triamcinolone ointment (MYCOLOG)   bacitracin 500 UNIT/GM ointment   Other Visit Diagnoses       Local skin infection       Relevant Medications   nystatin-triamcinolone ointment (MYCOLOG)   bacitracin 500 UNIT/GM ointment      No follow-ups on file.    Leita Longs, FNP

## 2024-07-09 ENCOUNTER — Telehealth: Payer: Self-pay

## 2024-07-09 NOTE — Telephone Encounter (Signed)
 Copied from CRM [id number]. Topic: Clinical - Medication Question >> Jul 09, 2024 10:15 AM Corin V wrote: Reason for CRM: Melanie with [facility] calling to confirm that PCP wants the bacitracin 500 UNIT/GM ointment to be the over the counter topical first aid ointment versus an eye cream due to location and not wanting this to get into the eye and cause irritation/other issues. Callback: [phone]

## 2024-07-11 ENCOUNTER — Telehealth: Payer: Self-pay

## 2024-07-11 ENCOUNTER — Other Ambulatory Visit: Payer: Self-pay

## 2024-07-11 MED ORDER — BACITRACIN-POLYMYXIN B 500-10000 UNIT/GM OP OINT: TOPICAL_OINTMENT | OPHTHALMIC | 2 refills | Status: DC

## 2024-07-11 NOTE — Telephone Encounter (Signed)
 Copied from CRM [id number]. Topic: Clinical - Medication Prior Auth >> Jul 11, 2024 11:23 AM Delon DASEN wrote: Reason for CRM: bacitracin 500 UNIT/GM ointment- patient calling for update on PA- [phone]

## 2024-07-11 NOTE — Telephone Encounter (Signed)
 Pharmacy said it was fixed now

## 2024-07-11 NOTE — Telephone Encounter (Signed)
 Needs approval.please advise

## 2024-07-11 NOTE — Telephone Encounter (Signed)
 I don't understand the message, but I changed it to the bacitracin ophthalmic ointment so that it is safe in case she gets in her eyes.

## 2024-07-15 DIAGNOSIS — H10402 Unspecified chronic conjunctivitis, left eye: Secondary | ICD-10-CM | POA: Insufficient documentation

## 2024-07-15 DIAGNOSIS — B351 Tinea unguium: Secondary | ICD-10-CM | POA: Insufficient documentation

## 2024-07-15 DIAGNOSIS — L89151 Pressure ulcer of sacral region, stage 1: Secondary | ICD-10-CM | POA: Insufficient documentation

## 2024-07-15 NOTE — Assessment & Plan Note (Signed)
 ntertriginous dermatitis under breast and groin, requires topical treatment. - Prescribed topical treatment for rash under breast and groin.

## 2024-07-15 NOTE — Assessment & Plan Note (Signed)
 Chronic conjunctivitis in left eye with crusting and discomfort, requires ongoing management. - Prescribed additional eye drops. - Advised use of warm washcloth to soften crusting.

## 2024-07-15 NOTE — Assessment & Plan Note (Signed)
 Stage 1 pressure ulcer on sacral region, nearly healed. - Prescribed topical treatment for sacral sore.

## 2024-07-18 ENCOUNTER — Other Ambulatory Visit: Payer: Self-pay

## 2024-07-18 DIAGNOSIS — G894 Chronic pain syndrome: Secondary | ICD-10-CM

## 2024-07-18 MED ORDER — HYDROMORPHONE HCL 8 MG PO TABS: 8.0000 mg | ORAL_TABLET | Freq: Four times a day (QID) | ORAL | 0 refills | Status: DC | PRN

## 2024-07-18 NOTE — Telephone Encounter (Signed)
 Copied from CRM #[id number]. Topic: Clinical - Medication Refill >> Jul 18, 2024 10:05 AM Charlet HERO wrote: Medication: HYDROmorphone  (DILAUDID ) 8 MG tablet  Has the patient contacted their pharmacy? Yes 0 refills  This is the patient's preferred pharmacy:  [facility] - [place], [place] - [address] [place] [address] Phone: [phone] Fax: [phone]  Is this the correct pharmacy for this prescription? Yes If no, delete pharmacy and type the correct one.   Has the prescription been filled recently? Yes  Is the patient out of the medication? No  Has the patient been seen for an appointment in the last year OR does the patient have an upcoming appointment? Yes  Can we respond through MyChart? No  Agent: Please be advised that Rx refills may take up to 3 business days. We ask that you follow-up with your pharmacy.

## 2024-08-01 ENCOUNTER — Other Ambulatory Visit: Payer: Self-pay

## 2024-08-01 MED ORDER — BISOPROLOL FUMARATE 5 MG PO TABS: 5.0000 mg | ORAL_TABLET | Freq: Every day | ORAL | 0 refills | Status: AC

## 2024-08-01 MED ORDER — SIMVASTATIN 40 MG PO TABS: 40.0000 mg | ORAL_TABLET | Freq: Every day | ORAL | 0 refills | Status: DC

## 2024-08-01 MED ORDER — LEVOTHYROXINE SODIUM 75 MCG PO TABS: 75.0000 ug | ORAL_TABLET | Freq: Every day | ORAL | 0 refills | Status: AC

## 2024-08-01 MED ORDER — METOCLOPRAMIDE HCL 5 MG PO TABS: 5.0000 mg | ORAL_TABLET | Freq: Four times a day (QID) | ORAL | 0 refills | Status: DC

## 2024-08-01 NOTE — Telephone Encounter (Signed)
 Copied from CRM [id number]. Topic: Clinical - Medication Refill >> Aug 01, 2024  9:18 AM Wess RAMAN wrote: Medication: bisoprolol  (ZEBETA ) 5 MG tablet  levothyroxine  (SYNTHROID , LEVOTHROID) 75 MCG tablet  simvastatin  (ZOCOR ) 40 MG tablet  metoCLOPramide  (REGLAN ) 5 MG tablet   Has the patient contacted their pharmacy? Yes (Agent: If no, request that the patient contact the pharmacy for the refill. If patient does not wish to contact the pharmacy document the reason why and proceed with request.) (Agent: If yes, when and what did the pharmacy advise?)  This is the patient's preferred pharmacy:  [facility] - [place], [place] - [address] [place] [address] Phone: [phone] Fax: [phone]  Is this the correct pharmacy for this prescription? Yes If no, delete pharmacy and type the correct one.   Has the prescription been filled recently? Yes  Is the patient out of the medication? No  Has the patient been seen for an appointment in the last year OR does the patient have an upcoming appointment? Yes  Can we respond through MyChart? No. Call [phone]  Agent: Please be advised that Rx refills may take up to 3 business days. We ask that you follow-up with your pharmacy.

## 2024-08-14 ENCOUNTER — Other Ambulatory Visit: Payer: Self-pay

## 2024-08-14 DIAGNOSIS — L89151 Pressure ulcer of sacral region, stage 1: Secondary | ICD-10-CM

## 2024-08-14 DIAGNOSIS — G894 Chronic pain syndrome: Secondary | ICD-10-CM

## 2024-08-14 MED ORDER — SANTYL 250 UNIT/GM EX OINT: 1.0000 | TOPICAL_OINTMENT | Freq: Every day | CUTANEOUS | 5 refills | Status: DC

## 2024-08-14 MED ORDER — BACITRACIN-POLYMYXIN B 500-10000 UNIT/GM OP OINT: TOPICAL_OINTMENT | OPHTHALMIC | 2 refills | Status: AC

## 2024-08-14 MED ORDER — NYSTATIN 100000 UNIT/GM EX CREA: TOPICAL_CREAM | Freq: Three times a day (TID) | CUTANEOUS | 2 refills | Status: DC

## 2024-08-14 MED ORDER — HYDROMORPHONE HCL 8 MG PO TABS: 8.0000 mg | ORAL_TABLET | Freq: Four times a day (QID) | ORAL | 0 refills | Status: DC | PRN

## 2024-08-14 NOTE — Telephone Encounter (Signed)
 Copied from CRM [id number]. Topic: Clinical - Medication Refill >> Aug 14, 2024  9:19 AM Amber H wrote: Medication: HYDROmorphone  (DILAUDID ) 8 MG tablet, nystatin -triamcinolone  ointment,   collagenase  (SANTYL ) 250 UNIT/GM ointment, and bacitracin -polymyxin b  (POLYSPORIN ) ophthalmic ointment  Has the patient contacted their pharmacy? No (Agent: If no, request that the patient contact the pharmacy for the refill. If patient does not wish to contact the pharmacy document the reason why and proceed with request.) (Agent: If yes, when and what did the pharmacy advise?)  This is the patient's preferred pharmacy:  [facility] - [place], [place] - [address] [place] [address] Phone: [phone] Fax: [phone]  Is this the correct pharmacy for this prescription? Yes If no, delete pharmacy and type the correct one.   Has the prescription been filled recently? Yes  Is the patient out of the medication? No, HYDROmorphone  (DILAUDID ) 8 MG tablet (will be out on the 12th) and nystatin -triamcinolone  ointment   Has the patient been seen for an appointment in the last year OR does the patient have an upcoming appointment? Yes  Can we respond through MyChart? No  Agent: Please be advised that Rx refills may take up to 3 business days. We ask that you follow-up with your pharmacy.

## 2024-09-03 ENCOUNTER — Other Ambulatory Visit: Payer: Self-pay

## 2024-09-12 ENCOUNTER — Ambulatory Visit

## 2024-09-12 VITALS — BP 171/92 | HR 96 | Ht 60.0 in | Wt 159.0 lb

## 2024-09-12 DIAGNOSIS — L89151 Pressure ulcer of sacral region, stage 1: Secondary | ICD-10-CM | POA: Diagnosis not present

## 2024-09-12 DIAGNOSIS — E039 Hypothyroidism, unspecified: Secondary | ICD-10-CM

## 2024-09-12 DIAGNOSIS — G894 Chronic pain syndrome: Secondary | ICD-10-CM | POA: Diagnosis not present

## 2024-09-12 DIAGNOSIS — Z131 Encounter for screening for diabetes mellitus: Secondary | ICD-10-CM | POA: Diagnosis not present

## 2024-09-12 DIAGNOSIS — B351 Tinea unguium: Secondary | ICD-10-CM

## 2024-09-12 DIAGNOSIS — M329 Systemic lupus erythematosus, unspecified: Secondary | ICD-10-CM

## 2024-09-12 MED ORDER — CEPHALEXIN 500 MG PO CAPS: 500.0000 mg | ORAL_CAPSULE | Freq: Three times a day (TID) | ORAL | 0 refills | Status: AC

## 2024-09-12 MED ORDER — NYSTATIN 100000 UNIT/GM EX CREA: TOPICAL_CREAM | Freq: Two times a day (BID) | CUTANEOUS | 5 refills | Status: DC

## 2024-09-12 MED ORDER — HYDROMORPHONE HCL 8 MG PO TABS: 8.0000 mg | ORAL_TABLET | Freq: Four times a day (QID) | ORAL | 0 refills | Status: DC | PRN

## 2024-09-12 MED ORDER — PREDNISONE 10 MG (21) PO TBPK: ORAL_TABLET | ORAL | 0 refills | Status: AC

## 2024-09-12 NOTE — Progress Notes (Signed)
 "  Established Patient Office Visit  Subjective   Patient ID: [name], female    DOB: 1945/11/10  Age: 79 y.o. MRN: [id number]  Chief Complaint  Patient presents with   Medical Management of Chronic Issues    Pt here for a follow up and blood sugar check    HPI  Patient Active Problem List   Diagnosis Date Noted   Pressure injury of sacral region, stage 1 07/15/2024   Chronic bacterial conjunctivitis of left eye 07/15/2024   Tinea unguium 07/15/2024   Systemic lupus erythematosus (HCC) 05/23/2024   Hypertension 05/23/2024   Chronic pain syndrome 05/23/2024   Special screening for malignant neoplasms, colon 06/12/2018   Fibromyalgia 01/05/2016   Hypothyroidism 01/05/2016   Rectal bleeding 01/05/2016   Diarrhea 01/05/2016   OA (osteoarthritis) of knee 09/30/2010    ROS    Objective:     BP (!) 171/92   Pulse 96   Ht 5' (1.524 m)   Wt 159 lb 0.6 oz (72.1 kg)   SpO2 95%   BMI 31.06 kg/m  BP Readings from Last 3 Encounters:  09/12/24 (!) 171/92  07/06/24 (!) 170/90  05/23/24 (!) 175/69   Wt Readings from Last 3 Encounters:  09/12/24 159 lb 0.6 oz (72.1 kg)  07/06/24 158 lb 1.3 oz (71.7 kg)  05/23/24 156 lb (70.8 kg)     Physical Exam Vitals and nursing note reviewed.  Constitutional:      Appearance: Normal appearance. She is obese.  HENT:     Head: Normocephalic.  Eyes:     Extraocular Movements: Extraocular movements intact.     Pupils: Pupils are equal, round, and reactive to light.  Cardiovascular:     Rate and Rhythm: Normal rate and regular rhythm.  Pulmonary:     Effort: Pulmonary effort is normal.     Breath sounds: Normal breath sounds.  Musculoskeletal:     Cervical back: Normal range of motion and neck supple.  Skin:    Findings: Wound present.      Neurological:     Mental Status: She is alert and oriented to person, place, and time.  Psychiatric:        Mood and Affect: Mood normal.        Thought Content: Thought content  normal.      Results for orders placed or performed in visit on 09/12/24  CBC  Result Value Ref Range   WBC 4.9 3.4 - 10.8 x10E3/uL   RBC 4.02 3.77 - 5.28 x10E6/uL   Hemoglobin 12.1 11.1 - 15.9 g/dL   Hematocrit 62.4 65.9 - 46.6 %   MCV 93 79 - 97 fL   MCH 30.1 26.6 - 33.0 pg   MCHC 32.3 31.5 - 35.7 g/dL   RDW 87.2 88.2 - 84.5 %   Platelets 213 150 - 450 x10E3/uL  CMP14+EGFR  Result Value Ref Range   Glucose 105 (H) 70 - 99 mg/dL   BUN 21 8 - 27 mg/dL   Creatinine, Ser 8.77 (H) 0.57 - 1.00 mg/dL   eGFR 45 (L) >40 fO/fpw/8.26   BUN/Creatinine Ratio 17 12 - 28   Sodium 139 134 - 144 mmol/L   Potassium 4.5 3.5 - 5.2 mmol/L   Chloride 102 96 - 106 mmol/L   CO2 22 20 - 29 mmol/L   Calcium 9.9 8.7 - 10.3 mg/dL   Total Protein 7.6 6.0 - 8.5 g/dL   Albumin 4.4 3.8 - 4.8 g/dL   Globulin, Total  3.2 1.5 - 4.5 g/dL   Bilirubin Total 0.4 0.0 - 1.2 mg/dL   Alkaline Phosphatase 88 49 - 135 IU/L   AST 24 0 - 40 IU/L   ALT 13 0 - 32 IU/L  HgB A1c  Result Value Ref Range   Hgb A1c MFr Bld 5.5 4.8 - 5.6 %   Est. average glucose Bld gHb Est-mCnc 111 mg/dL  TSH  Result Value Ref Range   TSH 0.781 0.450 - 4.500 uIU/mL      The ASCVD Risk score (Arnett DK, et al., 2019) failed to calculate for the following reasons:   Cannot find a previous HDL lab   Cannot find a previous total cholesterol lab   * - Cholesterol units were assumed    Assessment & Plan:   Problem List Items Addressed This Visit       Endocrine   Hypothyroidism   Update thyroid  labs.  Adjust dose if indicated.       Relevant Orders   TSH (Completed)     Musculoskeletal and Integument   Tinea unguium   Refill Nystatin  cream for ongoing rash under breasts and in groin.       Relevant Medications   cephALEXin  (KEFLEX ) 500 MG capsule   nystatin  cream (MYCOSTATIN )     Other   Systemic lupus erythematosus (HCC) - Primary (Chronic)   Suspect acute flare d/t butterfly rash on face. Will add prednisone   taper.       Relevant Medications   predniSONE  (STERAPRED UNI-PAK 21 TAB) 10 MG (21) TBPK tablet   Other Relevant Orders   CMP14+EGFR (Completed)   Chronic pain syndrome (Chronic)   Chronic pain related to fibromyalgia, systemic lupus and OA of knee, currently treated with hydromorphone  and Lyrica .     PDMP reviewed.  Agree to refill as previously prescribed.       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  Oral antibiotic added and referral placed for home health/wound care.       Relevant Medications   cephALEXin  (KEFLEX ) 500 MG capsule   Other Relevant Orders   CBC (Completed)   Ambulatory referral to Home Health   Other Visit Diagnoses       Screening for diabetes mellitus       Relevant Orders   HgB A1c (Completed)       Return in about 3 months (around 12/11/2024) for chronic follow-up with PCP.    Leita Longs, FNP  "

## 2024-09-13 ENCOUNTER — Telehealth: Payer: Self-pay

## 2024-09-13 LAB — HEMOGLOBIN A1C
Est. average glucose Bld gHb Est-mCnc: 111 mg/dL
Hgb A1c MFr Bld: 5.5 % (ref 4.8–5.6)

## 2024-09-13 LAB — CMP14+EGFR
ALT: 13 IU/L (ref 0–32)
AST: 24 IU/L (ref 0–40)
Albumin: 4.4 g/dL (ref 3.8–4.8)
Alkaline Phosphatase: 88 IU/L (ref 49–135)
BUN/Creatinine Ratio: 17 (ref 12–28)
BUN: 21 mg/dL (ref 8–27)
Bilirubin Total: 0.4 mg/dL (ref 0.0–1.2)
CO2: 22 mmol/L (ref 20–29)
Calcium: 9.9 mg/dL (ref 8.7–10.3)
Chloride: 102 mmol/L (ref 96–106)
Creatinine, Ser: 1.22 mg/dL — ABNORMAL HIGH (ref 0.57–1.00)
Globulin, Total: 3.2 g/dL (ref 1.5–4.5)
Glucose: 105 mg/dL — ABNORMAL HIGH (ref 70–99)
Potassium: 4.5 mmol/L (ref 3.5–5.2)
Sodium: 139 mmol/L (ref 134–144)
Total Protein: 7.6 g/dL (ref 6.0–8.5)
eGFR: 45 mL/min/1.73 — ABNORMAL LOW

## 2024-09-13 LAB — CBC
Hematocrit: 37.5 % (ref 34.0–46.6)
Hemoglobin: 12.1 g/dL (ref 11.1–15.9)
MCH: 30.1 pg (ref 26.6–33.0)
MCHC: 32.3 g/dL (ref 31.5–35.7)
MCV: 93 fL (ref 79–97)
Platelets: 213 x10E3/uL (ref 150–450)
RBC: 4.02 x10E6/uL (ref 3.77–5.28)
RDW: 12.7 % (ref 11.7–15.4)
WBC: 4.9 x10E3/uL (ref 3.4–10.8)

## 2024-09-13 LAB — TSH: TSH: 0.781 u[IU]/mL (ref 0.450–4.500)

## 2024-09-13 NOTE — Telephone Encounter (Signed)
 Please contact pharmacy to see what the issue is because the patient has moments of confusion.

## 2024-09-13 NOTE — Assessment & Plan Note (Signed)
 Suspect acute flare d/t butterfly rash on face. Will add prednisone  taper.

## 2024-09-13 NOTE — Telephone Encounter (Signed)
 Copied from CRM [id number]. Topic: Clinical - Prescription Issue >> Sep 12, 2024  4:13 PM Travis F wrote: Reason for CRM: Patient is calling in because her provider sent in some medications for her today, but the patient says the insurance isn't covering the medication and she needs her provider to find another pharmacy that will take her insurance and then give her a call. Please follow up with patient.

## 2024-09-13 NOTE — Assessment & Plan Note (Signed)
 Stage 1 pressure ulcer on sacral region, chronic in nature.  Oral antibiotic added and referral placed for home health/wound care.

## 2024-09-13 NOTE — Assessment & Plan Note (Signed)
 Update thyroid  labs.  Adjust dose if indicated.

## 2024-09-13 NOTE — Assessment & Plan Note (Signed)
 Chronic pain related to fibromyalgia, systemic lupus and OA of knee, currently treated with hydromorphone  and Lyrica .     PDMP reviewed.  Agree to refill as previously prescribed.

## 2024-09-13 NOTE — Assessment & Plan Note (Signed)
 Refill Nystatin  cream for ongoing rash under breasts and in groin.

## 2024-09-14 NOTE — Telephone Encounter (Signed)
 [facility] is currently closed will try again

## 2024-10-09 ENCOUNTER — Telehealth: Payer: Self-pay

## 2024-10-09 NOTE — Telephone Encounter (Signed)
 Chart updated

## 2024-10-10 ENCOUNTER — Other Ambulatory Visit: Payer: Self-pay

## 2024-10-10 ENCOUNTER — Telehealth: Payer: Self-pay

## 2024-10-10 DIAGNOSIS — G894 Chronic pain syndrome: Secondary | ICD-10-CM

## 2024-10-10 DIAGNOSIS — L89151 Pressure ulcer of sacral region, stage 1: Secondary | ICD-10-CM

## 2024-10-10 DIAGNOSIS — B351 Tinea unguium: Secondary | ICD-10-CM

## 2024-10-10 MED ORDER — HYDROMORPHONE HCL 8 MG PO TABS: 8.0000 mg | ORAL_TABLET | Freq: Four times a day (QID) | ORAL | 0 refills | Status: AC | PRN

## 2024-10-10 MED ORDER — METOCLOPRAMIDE HCL 5 MG PO TABS: 5.0000 mg | ORAL_TABLET | Freq: Four times a day (QID) | ORAL | 0 refills | Status: AC

## 2024-10-10 MED ORDER — SIMVASTATIN 40 MG PO TABS: 40.0000 mg | ORAL_TABLET | Freq: Every day | ORAL | 0 refills | Status: DC

## 2024-10-10 MED ORDER — SANTYL 250 UNIT/GM EX OINT: 1.0000 | TOPICAL_OINTMENT | Freq: Every day | CUTANEOUS | 5 refills | Status: AC

## 2024-10-10 MED ORDER — NYSTATIN 100000 UNIT/GM EX CREA: TOPICAL_CREAM | Freq: Two times a day (BID) | CUTANEOUS | 5 refills | Status: AC

## 2024-10-10 NOTE — Telephone Encounter (Signed)
 Hydromorphone  sent to pharmacy

## 2024-10-10 NOTE — Telephone Encounter (Signed)
 Hydromorphone  pending refill in chart I went ahead and refilled the other requests

## 2024-10-10 NOTE — Telephone Encounter (Signed)
 Will let pt know

## 2024-10-10 NOTE — Telephone Encounter (Signed)
 Copied from CRM [id number]. Topic: Clinical - Medication Refill >> Oct 10, 2024  9:22 AM Harlene ORN wrote: Medication: HYDROmorphone  (DILAUDID ) 8 MG tablet, simvastatin  (ZOCOR ) 40 MG tablet, metoCLOPramide  (REGLAN ) 5 MG tablet, nystatin  cream (MYCOSTATIN ), collagenase  (SANTYL ) 250 UNIT/GM ointment  Has the patient contacted their pharmacy? No (Agent: If no, request that the patient contact the pharmacy for the refill. If patient does not wish to contact the pharmacy document the reason why and proceed with request.) (Agent: If yes, when and what did the pharmacy advise?)  This is the patient's preferred pharmacy:  [id number] - [place], [place] - [address] [place] [address] Phone: [phone] Fax: [phone]  Is this the correct pharmacy for this prescription? Yes If no, delete pharmacy and type the correct one.   Has the prescription been filled recently? No  Is the patient out of the medication? No  Has the patient been seen for an appointment in the last year OR does the patient have an upcoming appointment? Yes  Can we respond through MyChart? No  Agent: Please be advised that Rx refills may take up to 3 business days. We ask that you follow-up with your pharmacy.

## 2024-12-24 ENCOUNTER — Ambulatory Visit: Payer: Self-pay
